# Patient Record
Sex: Female | Born: 1940
Health system: Southern US, Community
[De-identification: ages and names within clinical notes are randomized; demographics above are authoritative.]

## PROBLEM LIST (undated history)

## (undated) DIAGNOSIS — I10 Essential (primary) hypertension: Secondary | ICD-10-CM

## (undated) DIAGNOSIS — M810 Age-related osteoporosis without current pathological fracture: Secondary | ICD-10-CM

## (undated) DIAGNOSIS — M1990 Unspecified osteoarthritis, unspecified site: Secondary | ICD-10-CM

## (undated) DIAGNOSIS — G629 Polyneuropathy, unspecified: Secondary | ICD-10-CM

## (undated) DIAGNOSIS — C50911 Malignant neoplasm of unspecified site of right female breast: Secondary | ICD-10-CM

## (undated) DIAGNOSIS — E7800 Pure hypercholesterolemia, unspecified: Secondary | ICD-10-CM

## (undated) DIAGNOSIS — R296 Repeated falls: Secondary | ICD-10-CM

## (undated) DIAGNOSIS — F32A Depression, unspecified: Secondary | ICD-10-CM

## (undated) DIAGNOSIS — Z9889 Other specified postprocedural states: Secondary | ICD-10-CM

## (undated) DIAGNOSIS — F329 Major depressive disorder, single episode, unspecified: Secondary | ICD-10-CM

## (undated) DIAGNOSIS — W19XXXA Unspecified fall, initial encounter: Secondary | ICD-10-CM

## (undated) DIAGNOSIS — C189 Malignant neoplasm of colon, unspecified: Secondary | ICD-10-CM

## (undated) DIAGNOSIS — Z90710 Acquired absence of both cervix and uterus: Secondary | ICD-10-CM

## (undated) DIAGNOSIS — I341 Nonrheumatic mitral (valve) prolapse: Secondary | ICD-10-CM

## (undated) DIAGNOSIS — Z9849 Cataract extraction status, unspecified eye: Secondary | ICD-10-CM

## (undated) DIAGNOSIS — C50919 Malignant neoplasm of unspecified site of unspecified female breast: Secondary | ICD-10-CM

## (undated) DIAGNOSIS — C187 Malignant neoplasm of sigmoid colon: Secondary | ICD-10-CM

## (undated) HISTORY — DX: Malignant neoplasm of colon, unspecified: C18.9

## (undated) HISTORY — DX: Nonrheumatic mitral (valve) prolapse: I34.1

## (undated) HISTORY — DX: Repeated falls: R29.6

## (undated) HISTORY — DX: Major depressive disorder, single episode, unspecified: F32.9

## (undated) HISTORY — DX: Polyneuropathy, unspecified: G62.9

## (undated) HISTORY — DX: Pure hypercholesterolemia, unspecified: E78.00

## (undated) HISTORY — DX: Unspecified osteoarthritis, unspecified site: M19.90

## (undated) HISTORY — PX: ABDOMINAL HYSTERECTOMY: SHX81

## (undated) HISTORY — PX: KNEE SURGERY: SHX244

## (undated) HISTORY — DX: Age-related osteoporosis without current pathological fracture: M81.0

## (undated) HISTORY — PX: CATARACT EXTRACTION, BILATERAL: SHX1313

## (undated) HISTORY — PX: TONSILLECTOMY: SUR1361

## (undated) HISTORY — PX: OTHER SURGICAL HISTORY: SHX169

## (undated) HISTORY — DX: Unspecified fall, initial encounter: W19.XXXA

## (undated) HISTORY — DX: Malignant neoplasm of sigmoid colon: C18.7

## (undated) HISTORY — DX: Malignant neoplasm of unspecified site of right female breast: C50.911

## (undated) HISTORY — DX: Malignant neoplasm of unspecified site of unspecified female breast: C50.919

## (undated) HISTORY — DX: Depression, unspecified: F32.A

## (undated) HISTORY — PX: LOW ANTERIOR BOWEL RESECTION: SUR1240

---

## 2000-08-14 ENCOUNTER — Ambulatory Visit (HOSPITAL_COMMUNITY): Admission: RE | Admit: 2000-08-14 | Discharge: 2000-08-14 | Payer: Self-pay | Admitting: *Deleted

## 2000-08-14 ENCOUNTER — Encounter: Payer: Self-pay | Admitting: *Deleted

## 2000-08-29 ENCOUNTER — Encounter: Payer: Self-pay | Admitting: *Deleted

## 2000-08-29 ENCOUNTER — Other Ambulatory Visit: Admission: RE | Admit: 2000-08-29 | Discharge: 2000-08-29 | Payer: Self-pay | Admitting: *Deleted

## 2000-08-29 ENCOUNTER — Ambulatory Visit (HOSPITAL_COMMUNITY): Admission: RE | Admit: 2000-08-29 | Discharge: 2000-08-29 | Payer: Self-pay | Admitting: *Deleted

## 2001-09-17 ENCOUNTER — Ambulatory Visit (HOSPITAL_COMMUNITY): Admission: RE | Admit: 2001-09-17 | Discharge: 2001-09-17 | Payer: Self-pay | Admitting: Family Medicine

## 2001-09-17 ENCOUNTER — Encounter: Payer: Self-pay | Admitting: Family Medicine

## 2002-09-29 ENCOUNTER — Ambulatory Visit (HOSPITAL_COMMUNITY): Admission: RE | Admit: 2002-09-29 | Discharge: 2002-09-29 | Payer: Self-pay | Admitting: Family Medicine

## 2002-09-29 ENCOUNTER — Encounter: Payer: Self-pay | Admitting: Family Medicine

## 2002-10-28 ENCOUNTER — Encounter: Payer: Self-pay | Admitting: Obstetrics & Gynecology

## 2002-10-28 ENCOUNTER — Ambulatory Visit (HOSPITAL_COMMUNITY): Admission: RE | Admit: 2002-10-28 | Discharge: 2002-10-28 | Payer: Self-pay | Admitting: Obstetrics & Gynecology

## 2003-12-09 ENCOUNTER — Ambulatory Visit (HOSPITAL_COMMUNITY): Admission: RE | Admit: 2003-12-09 | Discharge: 2003-12-09 | Payer: Self-pay | Admitting: Family Medicine

## 2003-12-13 ENCOUNTER — Ambulatory Visit (HOSPITAL_COMMUNITY): Admission: RE | Admit: 2003-12-13 | Discharge: 2003-12-13 | Payer: Self-pay | Admitting: Family Medicine

## 2004-05-15 ENCOUNTER — Ambulatory Visit: Payer: Self-pay | Admitting: Internal Medicine

## 2004-05-18 ENCOUNTER — Ambulatory Visit (HOSPITAL_COMMUNITY): Admission: RE | Admit: 2004-05-18 | Discharge: 2004-05-18 | Payer: Self-pay | Admitting: Internal Medicine

## 2004-05-19 ENCOUNTER — Ambulatory Visit (HOSPITAL_COMMUNITY): Admission: RE | Admit: 2004-05-19 | Discharge: 2004-05-19 | Payer: Self-pay | Admitting: Internal Medicine

## 2004-05-29 ENCOUNTER — Inpatient Hospital Stay (HOSPITAL_COMMUNITY): Admission: RE | Admit: 2004-05-29 | Discharge: 2004-06-03 | Payer: Self-pay | Admitting: General Surgery

## 2004-06-30 ENCOUNTER — Encounter (HOSPITAL_COMMUNITY): Admission: RE | Admit: 2004-06-30 | Discharge: 2004-07-30 | Payer: Self-pay | Admitting: Oncology

## 2004-06-30 ENCOUNTER — Encounter: Admission: RE | Admit: 2004-06-30 | Discharge: 2004-06-30 | Payer: Self-pay | Admitting: Oncology

## 2004-07-12 ENCOUNTER — Ambulatory Visit (HOSPITAL_COMMUNITY): Payer: Self-pay | Admitting: Oncology

## 2004-07-19 ENCOUNTER — Ambulatory Visit (HOSPITAL_COMMUNITY): Admission: RE | Admit: 2004-07-19 | Discharge: 2004-07-19 | Payer: Self-pay | Admitting: Oncology

## 2004-07-24 ENCOUNTER — Ambulatory Visit (HOSPITAL_COMMUNITY): Admission: RE | Admit: 2004-07-24 | Discharge: 2004-07-24 | Payer: Self-pay | Admitting: General Surgery

## 2004-08-08 ENCOUNTER — Encounter (HOSPITAL_COMMUNITY): Admission: RE | Admit: 2004-08-08 | Discharge: 2004-09-07 | Payer: Self-pay | Admitting: Oncology

## 2004-08-08 ENCOUNTER — Encounter: Admission: RE | Admit: 2004-08-08 | Discharge: 2004-08-08 | Payer: Self-pay | Admitting: Oncology

## 2004-08-22 ENCOUNTER — Encounter: Admission: RE | Admit: 2004-08-22 | Discharge: 2004-08-22 | Payer: Self-pay | Admitting: Oncology

## 2004-08-29 ENCOUNTER — Ambulatory Visit (HOSPITAL_COMMUNITY): Payer: Self-pay | Admitting: Oncology

## 2004-09-05 ENCOUNTER — Encounter (HOSPITAL_COMMUNITY): Admission: RE | Admit: 2004-09-05 | Discharge: 2004-09-05 | Payer: Self-pay | Admitting: Family Medicine

## 2004-09-26 ENCOUNTER — Encounter: Admission: RE | Admit: 2004-09-26 | Discharge: 2004-09-26 | Payer: Self-pay | Admitting: Oncology

## 2004-09-26 ENCOUNTER — Encounter (HOSPITAL_COMMUNITY): Admission: RE | Admit: 2004-09-26 | Discharge: 2004-10-26 | Payer: Self-pay | Admitting: Oncology

## 2004-10-17 ENCOUNTER — Ambulatory Visit (HOSPITAL_COMMUNITY): Payer: Self-pay | Admitting: Oncology

## 2004-10-31 ENCOUNTER — Encounter: Admission: RE | Admit: 2004-10-31 | Discharge: 2004-10-31 | Payer: Self-pay | Admitting: Oncology

## 2004-10-31 ENCOUNTER — Encounter (HOSPITAL_COMMUNITY): Admission: RE | Admit: 2004-10-31 | Discharge: 2004-11-30 | Payer: Self-pay | Admitting: Oncology

## 2004-12-06 ENCOUNTER — Encounter (HOSPITAL_COMMUNITY): Admission: RE | Admit: 2004-12-06 | Discharge: 2004-12-30 | Payer: Self-pay | Admitting: Oncology

## 2004-12-06 ENCOUNTER — Encounter: Admission: RE | Admit: 2004-12-06 | Discharge: 2004-12-30 | Payer: Self-pay | Admitting: Oncology

## 2004-12-11 ENCOUNTER — Ambulatory Visit (HOSPITAL_COMMUNITY): Payer: Self-pay | Admitting: Oncology

## 2004-12-20 ENCOUNTER — Encounter (INDEPENDENT_AMBULATORY_CARE_PROVIDER_SITE_OTHER): Payer: Self-pay | Admitting: General Surgery

## 2004-12-20 ENCOUNTER — Ambulatory Visit (HOSPITAL_COMMUNITY): Admission: RE | Admit: 2004-12-20 | Discharge: 2004-12-20 | Payer: Self-pay | Admitting: General Surgery

## 2005-01-08 ENCOUNTER — Encounter: Admission: RE | Admit: 2005-01-08 | Discharge: 2005-01-08 | Payer: Self-pay | Admitting: Oncology

## 2005-01-08 ENCOUNTER — Encounter (HOSPITAL_COMMUNITY): Admission: RE | Admit: 2005-01-08 | Discharge: 2005-02-07 | Payer: Self-pay | Admitting: Oncology

## 2005-01-25 ENCOUNTER — Ambulatory Visit: Admission: RE | Admit: 2005-01-25 | Discharge: 2005-03-05 | Payer: Self-pay | Admitting: *Deleted

## 2005-02-05 ENCOUNTER — Ambulatory Visit (HOSPITAL_COMMUNITY): Payer: Self-pay | Admitting: Oncology

## 2005-02-12 ENCOUNTER — Encounter: Admission: RE | Admit: 2005-02-12 | Discharge: 2005-02-12 | Payer: Self-pay | Admitting: Oncology

## 2005-02-12 ENCOUNTER — Encounter (HOSPITAL_COMMUNITY): Admission: RE | Admit: 2005-02-12 | Discharge: 2005-03-14 | Payer: Self-pay | Admitting: Oncology

## 2005-03-19 ENCOUNTER — Encounter: Admission: RE | Admit: 2005-03-19 | Discharge: 2005-03-19 | Payer: Self-pay | Admitting: Oncology

## 2005-03-19 ENCOUNTER — Encounter (HOSPITAL_COMMUNITY): Admission: RE | Admit: 2005-03-19 | Discharge: 2005-03-28 | Payer: Self-pay | Admitting: Oncology

## 2005-03-28 ENCOUNTER — Ambulatory Visit (HOSPITAL_COMMUNITY): Payer: Self-pay | Admitting: Oncology

## 2005-04-03 ENCOUNTER — Encounter (HOSPITAL_COMMUNITY): Admission: RE | Admit: 2005-04-03 | Discharge: 2005-05-03 | Payer: Self-pay | Admitting: Oncology

## 2005-04-03 ENCOUNTER — Encounter: Admission: RE | Admit: 2005-04-03 | Discharge: 2005-04-03 | Payer: Self-pay | Admitting: Oncology

## 2005-05-15 ENCOUNTER — Encounter (HOSPITAL_COMMUNITY): Admission: RE | Admit: 2005-05-15 | Discharge: 2005-06-14 | Payer: Self-pay | Admitting: Oncology

## 2005-05-15 ENCOUNTER — Ambulatory Visit (HOSPITAL_COMMUNITY): Payer: Self-pay | Admitting: Oncology

## 2005-05-15 ENCOUNTER — Encounter: Admission: RE | Admit: 2005-05-15 | Discharge: 2005-05-15 | Payer: Self-pay | Admitting: Oncology

## 2005-06-19 ENCOUNTER — Encounter (HOSPITAL_COMMUNITY): Admission: RE | Admit: 2005-06-19 | Discharge: 2005-07-19 | Payer: Self-pay | Admitting: Oncology

## 2005-06-19 ENCOUNTER — Encounter: Admission: RE | Admit: 2005-06-19 | Discharge: 2005-06-19 | Payer: Self-pay | Admitting: Oncology

## 2005-07-18 ENCOUNTER — Ambulatory Visit: Payer: Self-pay | Admitting: Internal Medicine

## 2005-07-18 ENCOUNTER — Ambulatory Visit (HOSPITAL_COMMUNITY): Admission: RE | Admit: 2005-07-18 | Discharge: 2005-07-18 | Payer: Self-pay | Admitting: Internal Medicine

## 2005-07-31 ENCOUNTER — Ambulatory Visit (HOSPITAL_COMMUNITY): Payer: Self-pay | Admitting: Oncology

## 2005-07-31 ENCOUNTER — Encounter: Admission: RE | Admit: 2005-07-31 | Discharge: 2005-07-31 | Payer: Self-pay | Admitting: Oncology

## 2005-07-31 ENCOUNTER — Encounter (HOSPITAL_COMMUNITY): Admission: RE | Admit: 2005-07-31 | Discharge: 2005-08-30 | Payer: Self-pay | Admitting: Oncology

## 2005-08-08 ENCOUNTER — Ambulatory Visit (HOSPITAL_COMMUNITY): Admission: RE | Admit: 2005-08-08 | Discharge: 2005-08-08 | Payer: Self-pay | Admitting: Family Medicine

## 2005-09-12 ENCOUNTER — Encounter: Admission: RE | Admit: 2005-09-12 | Discharge: 2005-09-12 | Payer: Self-pay | Admitting: Oncology

## 2005-10-24 ENCOUNTER — Ambulatory Visit (HOSPITAL_COMMUNITY): Payer: Self-pay | Admitting: Oncology

## 2005-10-24 ENCOUNTER — Encounter: Admission: RE | Admit: 2005-10-24 | Discharge: 2005-10-24 | Payer: Self-pay | Admitting: Oncology

## 2005-10-24 ENCOUNTER — Encounter (HOSPITAL_COMMUNITY): Admission: RE | Admit: 2005-10-24 | Discharge: 2005-11-23 | Payer: Self-pay | Admitting: Oncology

## 2005-12-05 ENCOUNTER — Encounter: Admission: RE | Admit: 2005-12-05 | Discharge: 2005-12-28 | Payer: Self-pay | Admitting: Oncology

## 2005-12-21 ENCOUNTER — Ambulatory Visit (HOSPITAL_COMMUNITY): Payer: Self-pay | Admitting: Oncology

## 2006-01-16 ENCOUNTER — Encounter (HOSPITAL_COMMUNITY): Admission: RE | Admit: 2006-01-16 | Discharge: 2006-02-15 | Payer: Self-pay | Admitting: Oncology

## 2006-01-16 ENCOUNTER — Encounter: Admission: RE | Admit: 2006-01-16 | Discharge: 2006-01-16 | Payer: Self-pay | Admitting: Oncology

## 2006-02-27 ENCOUNTER — Ambulatory Visit (HOSPITAL_COMMUNITY): Payer: Self-pay | Admitting: Oncology

## 2006-04-10 ENCOUNTER — Encounter (HOSPITAL_COMMUNITY): Admission: RE | Admit: 2006-04-10 | Discharge: 2006-05-10 | Payer: Self-pay | Admitting: Oncology

## 2006-05-22 ENCOUNTER — Ambulatory Visit (HOSPITAL_COMMUNITY): Payer: Self-pay | Admitting: Oncology

## 2006-06-25 ENCOUNTER — Encounter (HOSPITAL_COMMUNITY): Admission: RE | Admit: 2006-06-25 | Discharge: 2006-07-25 | Payer: Self-pay | Admitting: Oncology

## 2006-08-13 ENCOUNTER — Ambulatory Visit (HOSPITAL_COMMUNITY): Payer: Self-pay | Admitting: Oncology

## 2006-09-04 ENCOUNTER — Ambulatory Visit (HOSPITAL_COMMUNITY): Admission: RE | Admit: 2006-09-04 | Discharge: 2006-09-04 | Payer: Self-pay | Admitting: Family Medicine

## 2006-09-25 ENCOUNTER — Encounter (HOSPITAL_COMMUNITY): Admission: RE | Admit: 2006-09-25 | Discharge: 2006-10-25 | Payer: Self-pay | Admitting: Oncology

## 2006-11-06 ENCOUNTER — Ambulatory Visit (HOSPITAL_COMMUNITY): Payer: Self-pay | Admitting: Oncology

## 2006-12-30 ENCOUNTER — Encounter (HOSPITAL_COMMUNITY): Admission: RE | Admit: 2006-12-30 | Discharge: 2006-12-31 | Payer: Self-pay | Admitting: Oncology

## 2006-12-30 ENCOUNTER — Ambulatory Visit (HOSPITAL_COMMUNITY): Payer: Self-pay | Admitting: Oncology

## 2007-03-25 ENCOUNTER — Ambulatory Visit (HOSPITAL_COMMUNITY): Payer: Self-pay | Admitting: Oncology

## 2007-03-25 ENCOUNTER — Encounter (HOSPITAL_COMMUNITY): Admission: RE | Admit: 2007-03-25 | Discharge: 2007-04-02 | Payer: Self-pay | Admitting: Oncology

## 2007-06-17 ENCOUNTER — Ambulatory Visit (HOSPITAL_COMMUNITY): Payer: Self-pay | Admitting: Oncology

## 2007-06-17 ENCOUNTER — Encounter (HOSPITAL_COMMUNITY): Admission: RE | Admit: 2007-06-17 | Discharge: 2007-07-17 | Payer: Self-pay | Admitting: Oncology

## 2007-09-08 ENCOUNTER — Encounter (HOSPITAL_COMMUNITY): Admission: RE | Admit: 2007-09-08 | Discharge: 2007-10-08 | Payer: Self-pay | Admitting: Oncology

## 2007-09-08 ENCOUNTER — Ambulatory Visit (HOSPITAL_COMMUNITY): Payer: Self-pay | Admitting: Oncology

## 2007-10-08 ENCOUNTER — Ambulatory Visit (HOSPITAL_COMMUNITY): Admission: RE | Admit: 2007-10-08 | Discharge: 2007-10-08 | Payer: Self-pay | Admitting: Family Medicine

## 2007-12-01 ENCOUNTER — Encounter (HOSPITAL_COMMUNITY): Admission: RE | Admit: 2007-12-01 | Discharge: 2007-12-31 | Payer: Self-pay | Admitting: Oncology

## 2007-12-01 ENCOUNTER — Ambulatory Visit (HOSPITAL_COMMUNITY): Payer: Self-pay | Admitting: Oncology

## 2008-01-26 ENCOUNTER — Ambulatory Visit (HOSPITAL_COMMUNITY): Payer: Self-pay | Admitting: Oncology

## 2008-02-23 ENCOUNTER — Encounter (HOSPITAL_COMMUNITY): Admission: RE | Admit: 2008-02-23 | Discharge: 2008-03-24 | Payer: Self-pay | Admitting: Oncology

## 2008-04-05 ENCOUNTER — Ambulatory Visit (HOSPITAL_COMMUNITY): Payer: Self-pay | Admitting: Oncology

## 2008-05-17 ENCOUNTER — Encounter (HOSPITAL_COMMUNITY): Admission: RE | Admit: 2008-05-17 | Discharge: 2008-06-16 | Payer: Self-pay | Admitting: Oncology

## 2008-07-07 ENCOUNTER — Ambulatory Visit (HOSPITAL_COMMUNITY): Payer: Self-pay | Admitting: Oncology

## 2008-07-26 ENCOUNTER — Encounter: Payer: Self-pay | Admitting: Internal Medicine

## 2008-08-06 ENCOUNTER — Encounter: Payer: Self-pay | Admitting: Internal Medicine

## 2008-08-18 ENCOUNTER — Encounter (HOSPITAL_COMMUNITY): Admission: RE | Admit: 2008-08-18 | Discharge: 2008-09-17 | Payer: Self-pay | Admitting: Oncology

## 2008-08-27 ENCOUNTER — Ambulatory Visit: Payer: Self-pay | Admitting: Internal Medicine

## 2008-08-27 ENCOUNTER — Ambulatory Visit (HOSPITAL_COMMUNITY): Admission: RE | Admit: 2008-08-27 | Discharge: 2008-08-27 | Payer: Self-pay | Admitting: Internal Medicine

## 2008-08-27 HISTORY — PX: COLONOSCOPY: SHX174

## 2008-08-31 ENCOUNTER — Encounter: Payer: Self-pay | Admitting: Internal Medicine

## 2008-09-23 ENCOUNTER — Ambulatory Visit (HOSPITAL_COMMUNITY): Admission: RE | Admit: 2008-09-23 | Discharge: 2008-09-23 | Payer: Self-pay | Admitting: Family Medicine

## 2008-09-28 ENCOUNTER — Ambulatory Visit (HOSPITAL_COMMUNITY): Admission: RE | Admit: 2008-09-28 | Discharge: 2008-09-28 | Payer: Self-pay | Admitting: Ophthalmology

## 2008-09-29 ENCOUNTER — Ambulatory Visit (HOSPITAL_COMMUNITY): Payer: Self-pay | Admitting: Oncology

## 2008-10-12 ENCOUNTER — Ambulatory Visit (HOSPITAL_COMMUNITY): Admission: RE | Admit: 2008-10-12 | Discharge: 2008-10-12 | Payer: Self-pay | Admitting: Ophthalmology

## 2008-11-10 ENCOUNTER — Encounter (HOSPITAL_COMMUNITY): Admission: RE | Admit: 2008-11-10 | Discharge: 2008-12-10 | Payer: Self-pay | Admitting: Oncology

## 2008-12-22 ENCOUNTER — Ambulatory Visit (HOSPITAL_COMMUNITY): Payer: Self-pay | Admitting: Oncology

## 2008-12-22 ENCOUNTER — Encounter (HOSPITAL_COMMUNITY): Admission: RE | Admit: 2008-12-22 | Discharge: 2008-12-29 | Payer: Self-pay | Admitting: Oncology

## 2009-02-07 ENCOUNTER — Encounter: Payer: Self-pay | Admitting: Internal Medicine

## 2009-02-21 ENCOUNTER — Ambulatory Visit (HOSPITAL_COMMUNITY): Payer: Self-pay | Admitting: Oncology

## 2009-03-28 ENCOUNTER — Encounter (HOSPITAL_COMMUNITY): Admission: RE | Admit: 2009-03-28 | Discharge: 2009-04-27 | Payer: Self-pay | Admitting: Oncology

## 2009-05-09 ENCOUNTER — Ambulatory Visit (HOSPITAL_COMMUNITY): Payer: Self-pay | Admitting: Oncology

## 2009-06-20 ENCOUNTER — Encounter (HOSPITAL_COMMUNITY): Admission: RE | Admit: 2009-06-20 | Discharge: 2009-07-20 | Payer: Self-pay | Admitting: Oncology

## 2009-07-26 ENCOUNTER — Ambulatory Visit (HOSPITAL_COMMUNITY): Payer: Self-pay | Admitting: Oncology

## 2009-09-02 ENCOUNTER — Encounter: Payer: Self-pay | Admitting: Internal Medicine

## 2009-09-06 ENCOUNTER — Encounter (HOSPITAL_COMMUNITY): Admission: RE | Admit: 2009-09-06 | Discharge: 2009-10-06 | Payer: Self-pay | Admitting: Oncology

## 2009-09-28 ENCOUNTER — Ambulatory Visit (HOSPITAL_COMMUNITY): Admission: RE | Admit: 2009-09-28 | Discharge: 2009-09-28 | Payer: Self-pay | Admitting: Oncology

## 2009-10-18 ENCOUNTER — Ambulatory Visit (HOSPITAL_COMMUNITY): Payer: Self-pay | Admitting: Oncology

## 2009-11-16 ENCOUNTER — Ambulatory Visit (HOSPITAL_COMMUNITY): Admission: RE | Admit: 2009-11-16 | Discharge: 2009-11-16 | Payer: Self-pay | Admitting: Family Medicine

## 2009-11-29 ENCOUNTER — Encounter (HOSPITAL_COMMUNITY): Admission: RE | Admit: 2009-11-29 | Discharge: 2009-12-29 | Payer: Self-pay | Admitting: Oncology

## 2010-01-24 ENCOUNTER — Ambulatory Visit (HOSPITAL_COMMUNITY): Payer: Self-pay | Admitting: Oncology

## 2010-02-08 ENCOUNTER — Encounter: Payer: Self-pay | Admitting: Internal Medicine

## 2010-03-06 ENCOUNTER — Encounter (HOSPITAL_COMMUNITY)
Admission: RE | Admit: 2010-03-06 | Discharge: 2010-04-05 | Payer: Self-pay | Source: Home / Self Care | Attending: Oncology | Admitting: Oncology

## 2010-04-02 DIAGNOSIS — Z923 Personal history of irradiation: Secondary | ICD-10-CM

## 2010-04-02 HISTORY — DX: Personal history of irradiation: Z92.3

## 2010-04-18 ENCOUNTER — Encounter (HOSPITAL_COMMUNITY)
Admission: RE | Admit: 2010-04-18 | Discharge: 2010-05-02 | Payer: Self-pay | Source: Home / Self Care | Attending: Oncology | Admitting: Oncology

## 2010-04-18 ENCOUNTER — Ambulatory Visit (HOSPITAL_COMMUNITY)
Admission: RE | Admit: 2010-04-18 | Discharge: 2010-05-02 | Payer: Self-pay | Source: Home / Self Care | Attending: Oncology | Admitting: Oncology

## 2010-04-24 ENCOUNTER — Encounter: Payer: Self-pay | Admitting: Family Medicine

## 2010-05-02 NOTE — Letter (Signed)
Summary: [facility]  [facility]   Imported By: Minna Merritts 09/02/2009 16:41:56  _____________________________________________________________________  External Attachment:    Type:   Image     Comment:   External Document

## 2010-05-02 NOTE — Letter (Signed)
Summary: [facility]  [facility]   Imported By: Rexene Alberts 02/08/2010 16:50:22  _____________________________________________________________________  External Attachment:    Type:   Image     Comment:   External Document

## 2010-05-30 ENCOUNTER — Other Ambulatory Visit (HOSPITAL_COMMUNITY): Payer: Medicare Other

## 2010-05-30 ENCOUNTER — Encounter (HOSPITAL_COMMUNITY): Payer: Medicare Other | Attending: Oncology

## 2010-05-30 DIAGNOSIS — C50919 Malignant neoplasm of unspecified site of unspecified female breast: Secondary | ICD-10-CM

## 2010-05-30 DIAGNOSIS — Z853 Personal history of malignant neoplasm of breast: Secondary | ICD-10-CM | POA: Insufficient documentation

## 2010-05-30 DIAGNOSIS — Z85038 Personal history of other malignant neoplasm of large intestine: Secondary | ICD-10-CM | POA: Insufficient documentation

## 2010-05-30 DIAGNOSIS — C189 Malignant neoplasm of colon, unspecified: Secondary | ICD-10-CM

## 2010-06-12 LAB — CBC
MCH: 30.8 pg (ref 26.0–34.0)
MCHC: 34.5 g/dL (ref 30.0–36.0)
Platelets: 119 10*3/uL — ABNORMAL LOW (ref 150–400)

## 2010-06-12 LAB — COMPREHENSIVE METABOLIC PANEL
Albumin: 4 g/dL (ref 3.5–5.2)
BUN: 10 mg/dL (ref 6–23)
Chloride: 104 mEq/L (ref 96–112)
Creatinine, Ser: 0.95 mg/dL (ref 0.4–1.2)
Total Bilirubin: 0.7 mg/dL (ref 0.3–1.2)
Total Protein: 6.2 g/dL (ref 6.0–8.3)

## 2010-06-12 LAB — CEA: CEA: <0.5 ng/mL (ref 0.0–5.0)

## 2010-06-12 LAB — CANCER ANTIGEN 27.29: CA 27.29: 10 U/mL (ref 0–39)

## 2010-06-16 LAB — CEA: CEA: <0.5 ng/mL (ref 0.0–5.0)

## 2010-06-19 LAB — CANCER ANTIGEN 27.29: CA 27.29: 9 U/mL (ref 0–39)

## 2010-06-25 LAB — CEA: CEA: 0.7 ng/mL (ref 0.0–5.0)

## 2010-07-07 LAB — CEA: CEA: <0.5 ng/mL (ref 0.0–5.0)

## 2010-07-08 LAB — CEA: CEA: <0.5 ng/mL (ref 0.0–5.0)

## 2010-07-10 LAB — HEMOGLOBIN AND HEMATOCRIT, BLOOD
HCT: 37.2 % (ref 36.0–46.0)
Hemoglobin: 13.1 g/dL (ref 12.0–15.0)

## 2010-07-10 LAB — BASIC METABOLIC PANEL
BUN: 14 mg/dL (ref 6–23)
CO2: 27 mEq/L (ref 19–32)
Calcium: 9.1 mg/dL (ref 8.4–10.5)
Chloride: 104 mEq/L (ref 96–112)
Creatinine, Ser: 1.01 mg/dL (ref 0.4–1.2)
GFR calc Af Amer: >60 mL/min (ref >60)
GFR calc non Af Amer: 55 mL/min — ABNORMAL LOW (ref >60)
Glucose, Bld: 92 mg/dL (ref 70–99)
Potassium: 4.4 mEq/L (ref 3.5–5.1)
Sodium: 137 mEq/L (ref 135–145)

## 2010-07-11 ENCOUNTER — Encounter (HOSPITAL_COMMUNITY): Payer: Medicare Other | Admitting: Oncology

## 2010-07-11 ENCOUNTER — Encounter (HOSPITAL_COMMUNITY): Payer: Medicare Other | Attending: Oncology

## 2010-07-11 DIAGNOSIS — Z85038 Personal history of other malignant neoplasm of large intestine: Secondary | ICD-10-CM | POA: Insufficient documentation

## 2010-07-11 DIAGNOSIS — C50919 Malignant neoplasm of unspecified site of unspecified female breast: Secondary | ICD-10-CM

## 2010-07-11 DIAGNOSIS — C189 Malignant neoplasm of colon, unspecified: Secondary | ICD-10-CM

## 2010-07-11 DIAGNOSIS — Z853 Personal history of malignant neoplasm of breast: Secondary | ICD-10-CM | POA: Insufficient documentation

## 2010-07-11 LAB — CANCER ANTIGEN 27.29: CA 27.29: 14 U/mL (ref 0–39)

## 2010-07-18 LAB — CEA: CEA: <0.5 ng/mL (ref 0.0–5.0)

## 2010-08-15 NOTE — Op Note (Signed)
NAME:  Patricia Kline, Patricia Kline                  ACCOUNT NO.:  [id number]   MEDICAL RECORD NO.:  [id number]          PATIENT TYPE:  AMB   LOCATION:  DAY                           FACILITY:  [facility]   PHYSICIAN:  R. Roetta Sessions, M.D. DATE OF BIRTH:  03-14-1941   DATE OF PROCEDURE:  08/27/2008  DATE OF DISCHARGE:                               OPERATIVE REPORT   SURVEILLANCE COLONOSCOPY   INDICATIONS FOR PROCEDURE:  A 67-year lady who underwent a low anterior  resection for colorectal cancer back in 2006.  She had negative  surveillance exam in 2007.  She is devoid of any lower GI tract symptoms  now.  She is here for her 3-year surveillance examination.  Risks,  benefits, alternatives, and limitations have been discussed, questions  answered.  Please see documentation in the medical record.   PROCEDURE NOTE:  O2 saturation, blood pressure, pulse and respirations  were monitored throughout the entire procedure.  Conscious sedation  Versed 5 mg IV, Demerol 100 mg IV in divided doses.  Zofran 4 mg IV  prophylaxis against nausea.   INSTRUMENT:  Pentax video chip system.   FINDINGS:  Digital rectal exam revealed no abnormalities.   ENDOSCOPIC FINDINGS:  The prep was good.   Examination of rectal mucosa revealed some anal papilla.  Surgical  anastomosis at 8-10 cm that appeared normal.  The residual colon was  imaged from the anastomosis all the way to the cecum.  Cecum, ileocecal  valve, appendiceal orifice well seen and photographed for the record.  From this level, scope was slowly and cautiously withdrawn.  All  previous mentioned mucosal surfaces were again seen.  The residual  colonic mucosa appeared normal.  The scope was pulled down in the rectum  where the above-mentioned findings are reconfirmed.  The rectal vault  was small and did not attempt to retroflex, but for the same reason, I  was able see the rectal mucosa en face very well, and it appeared normal  aside from a couple anal  papilla.  The patient tolerated the procedure  well and was reactive to endoscopy.  Cecal withdrawal time 6 minutes.   IMPRESSION:  Anal papilla.  Surgical anastomosis at 8-10 cm from the  anal verge.  Residual rectal and colonic mucosa appeared normal.   RECOMMENDATIONS:  Repeat colonoscopy in 5 years.      Jonathon Bellows, M.D.  Electronically Signed     RMR/MEDQ  D:  08/27/2008  T:  08/27/2008  Job:  [id number]   cc:   Ladona Horns. Mariel Sleet, MD  Fax: [phone]

## 2010-08-18 NOTE — Op Note (Signed)
NAME:  Patricia Kline, Patricia Kline                  ACCOUNT NO.:  [id number]   MEDICAL RECORD NO.:  [id number]          PATIENT TYPE:  AMB   LOCATION:  DAY                           FACILITY:  [facility]   PHYSICIAN:  R. Roetta Sessions, M.D. DATE OF BIRTH:  Apr 17, 1940   DATE OF PROCEDURE:  05/18/2004  DATE OF DISCHARGE:                                 OPERATIVE REPORT   PROCEDURE:  Incomplete colonoscopy (sigmoidoscopy with biopsy).   INDICATIONS FOR PROCEDURE:  The patient is a 70 year old lady with chronic  diarrhea and intermittent blood per rectum. She was seen in the office back  in October of 2005 with chronic diarrhea. At that time, she declined  colonoscopy. She is now agreeable and comes for colonoscopy at this time.  This approach has been discussed with the patient at length. Potential  risks, benefits, and alternatives have been reviewed and questions answered.   PROCEDURE NOTE:  O2 saturation, blood pressure, pulse, and respirations  monitored throughout the entirety of the procedure. Conscious sedation with  Versed 4 mg IV and Demerol 75 mg IV and Zofran 4 mg for nausea prior to the  procedure.   INSTRUMENT:  Olympus video chip system (pediatric scope).   FINDINGS:  Digital rectal examination reveals a fungating mass barely  palpable at the tip of the examiner's finger.   ENDOSCOPIC FINDINGS:  Prep was adequate.   Rectum:  Examination of the rectal mucosa revealed a cauliflower appearing  apple-core exophytic tumor beginning at 10 cm from the anal verge. The  rectum lumen narrowed down to a pinpoint in the center. After several  attempts, I was unable to negotiate the pediatric scope through the severely  narrowed lumen. I was able to retroflex and examine the remainder of the  rectum distal to this lesion which revealed no abnormalities. Multiple  biopsies were taken. The lesion was friable. Biopsy tissue came off in  chunks. The patient the procedure very well.   IMPRESSION:   Fungating exophytic apple-core rectal tumor, severely narrowing  the lumen. Proximal extent unknown. Could not advance the pediatric scope  beyond the distal aspect of the strictured segment. This lesion began 10 cm  from the anal verge. It is palpable on digital rectal examination. Biopsies  taken.   RECOMMENDATIONS:  1.  Will draw labs including a CEA.  2.  Will go ahead and do an abdominal and pelvic CT scan. She will need      surgical consultation in the very near future. This is a low-lying      lesion.      RMR/MEDQ  D:  05/18/2004  T:  05/18/2004  Job:  [id number]   cc:   Corrie Mckusick, M.D.  Fax: [phone]

## 2010-08-18 NOTE — Op Note (Signed)
NAME:  Patricia Kline, Patricia Kline NO.:  [id number]   MEDICAL RECORD NO.:  [id number]          PATIENT TYPE:  AMB   LOCATION:  DAY                           FACILITY:  [facility]   PHYSICIAN:  Dalia Heading, M.D.  DATE OF BIRTH:  1940/06/04   DATE OF PROCEDURE:  05/29/2004  DATE OF DISCHARGE:                                 OPERATIVE REPORT   PREOPERATIVE DIAGNOSIS:  Distal sigmoid colon carcinoma.   POSTOPERATIVE DIAGNOSIS:  Distal sigmoid colon carcinoma.   PROCEDURE:  Low anterior resection with transanal anastomosis.   SURGEON:  Dr. Franky Macho.   ASSISTANT:  Dr. Arna Snipe.   ANESTHESIA:  General endotracheal.   INDICATIONS:  The patient is a 70 year old white female who presents with an  apple-core lesion in the distal sigmoid colon. It is palpable by rectal  examination. Risks and benefits of the procedure including bleeding,  infection, cardiopulmonary difficulties, the possibility of a blood  transfusion, and the possibility of a colostomy were fully explained to the  patient, who gave informed consent.   PROCEDURE NOTE:  The patient was placed in the lithotomy position after  induction of general endotracheal anesthesia. The patient was then prepped  and draped using the usual sterile technique with Betadine. Surgical site  confirmation was performed.   A lower midline incision was made from the umbilicus to the suprapubic  region. The peritoneal cavity was entered into without difficulty. On  exploration of the abdomen, the limits. The gallbladder was within normal  limits. The nasogastric tube was noted be in its appropriate position in the  stomach. The small bowel and colon was within normal limits. No abnormal  lesions were noted. The patient was noted to have enlarged right ovary with  endometriosis present. This was freed away from the underlying colon. The  mesentery of the sigmoid colon and rectum was incised using the harmonic  scalpel on either  side. The mesentery was divided using the harmonic  scalpel. The inferior rectal artery and vein were divided without  difficulty. The mass was noted to be folded up and attached anteriorly to  the perineum. This was freed away sharply and bluntly without difficulty. A  TA stapler was placed distal to the mass and fired. GIA staple was placed  across the midsigmoid colon and fired. A single suture was placed to mark  the bowel anteriorly. Specimen was sent to pathology. Grossly, the anterior  distal margin was 1.6 cm, distal posterior margin 3 cm. Specimen was then  further processed by pathology. An auto purse-stringer was placed on the  proximal colon and fired. The anvil of the circular EEA was then inserted  into this. The base of the #29 circular EEA stapler was then placed  transanally. The two ends were connected together, and the EEA circular  stapler was fired. Two good donuts were found at the end of the stapling.  The staple line was bolstered using 3-0 silk sutures. Air was instilled  transanally. There was no evidence of anastomotic leak. The pelvis was then  copiously irrigated with gentamicin  and normal saline. Surgicel and Gelfoam  were then packed along the posterior aspect of the anastomosis. The bowel  was returned into the abdominal cavity in orderly fashion. All operating  room personnel then changed their gloves.   The fascia was reapproximated using a looped O Novofil running suture.  Subcutaneous layer was irrigated with normal saline and skin was closed  using staples. Betadine ointment and dry sterile dressing were applied.   All tape and needle counts were correct and the procedure. The patient was  extubated in the upper room went back to recovery room awake in stable  condition.   COMPLICATIONS:  None.   SPECIMEN:  Colon, opened and distal.   BLOOD LOSS:  250 cc.      MAJ/MEDQ  D:  05/29/2004  T:  05/29/2004  Job:  [id number]   cc:   R. Roetta Sessions,  M.D.  [address]  [place]  [place] [address]   Corrie Mckusick, M.D.  Fax: [phone]

## 2010-08-18 NOTE — Discharge Summary (Signed)
NAME:  Patricia Kline, Patricia Kline NO.:  [id number]   MEDICAL RECORD NO.:  [id number]          PATIENT TYPE:  INP   LOCATION:  A327                          FACILITY:  [facility]   PHYSICIAN:  Dalia Heading, M.D.  DATE OF BIRTH:  02-Apr-1941   DATE OF ADMISSION:  DATE OF DISCHARGE:  03/04/2006LH                                 DISCHARGE SUMMARY   HOSPITAL COURSE:  Summary, the patient is a 70 year old white female who was  found on colonoscopy to have a distal sigmoid colon stricture which was  positive for adenocarcinoma. The patient underwent a low anterior resection  with transanal anastomosis on 05/29/2004. She tolerated the surgery well.  Her postoperative course was remarkable for anemia. She did receive 4 units  of packed red blood cells postoperatively. Her Lovenox was held as it was  suspected this was causing excess bleeding in addition to the surgery and  her preoperative anemia. Her diet was advanced without difficulty once her  bowel function returned. 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 Her preoperative CEA level was 2.9.   The patient's hematocrit was 30 at the time of discharge. The patient is  being discharged home in good and improving condition.   DISCHARGE INSTRUCTIONS:  The patient is to follow up Dr. Franky Macho on  06/06/2004.   DISCHARGE MEDICATIONS:  Discharge medications include iron supplements 1  tablet p.o. b.i.d.  She is to hold her blood pressure medications for now.   PRINCIPAL DIAGNOSES:  1.  [facility] colon carcinoma  2.  Anemia.  3.  History of hypertension.   PRINCIPAL PROCEDURE:  Low anterior resection with transanal anastomosis on  05/29/2004.      MAJ/MEDQ  D:  06/03/2004  T:  06/03/2004  Job:  [id number]   cc:   R. Roetta Sessions, M.D.  [address]  [place]  [place] [address]   Corrie Mckusick, M.D.  Fax: [phone]

## 2010-08-18 NOTE — H&P (Signed)
NAME:  Patricia Kline, Patricia Kline                  ACCOUNT NO.:  [id number]   MEDICAL RECORD NO.:  [id number]          PATIENT TYPE:  AMB   LOCATION:                                 FACILITY:   PHYSICIAN:  Dalia Heading, M.D.  DATE OF BIRTH:  [birth year]   DATE OF ADMISSION:  DATE OF DISCHARGE:  LH                                HISTORY & PHYSICAL   CHIEF COMPLAINT:  Need for central venous access for chemotherapy.   HISTORY OF PRESENT ILLNESS:  The patient is a 70 year old white female  status post a low anterior resection in February 2006 who now presents for  Port-A-Cath. She is about to undergo chemotherapy for treatment of her colon  cancer.   PAST MEDICAL HISTORY:  As noted above, hypertension.   PAST SURGICAL HISTORY:  1.  Low anterior resection in February 2006.  2.  Breast biopsy.  3.  Bone tumor, right leg.  4.  Hysterectomy.  5.  Tonsillectomy.   CURRENT MEDICATIONS:  1.  Lisinopril/hydrochlorothiazide 20/12.5 mg p.o. q.d.  2.  Alprazolam 0.5 mg p.o. q.d.   ALLERGIES:  PENICILLIN.   REVIEW OF SYSTEMS:  Noncontributory.   PHYSICAL EXAMINATION:  GENERAL:  The patient is a white female in no acute  distress. She is afebrile and vital signs are stable.  LUNGS:  Clear to auscultation with equal breath sounds bilaterally.  HEART:  Reveals a regular rate and rhythm without S3, S4, or murmurs.  ABDOMEN:  Soft and nontender. A well healing midline surgical scar is noted.   IMPRESSION:  Colon carcinoma, need for central venous access.   PLAN:  The patient is scheduled for Port-A-Cath insertion on July 24, 2004.  The risks and benefits of the procedure including bleeding, infection, and  pneumothorax were fully explained to the patient who gave informed consent.      MAJ/MEDQ  D:  07/13/2004  T:  07/13/2004  Job:  [id number]   cc:   Ladona Horns. Neijstrom, MD  [address]  [place]  [place] [address]  Fax: [phone]   [facility]  Fax: [phone]   Corrie Mckusick,  M.D.  Fax: [phone]

## 2010-08-18 NOTE — Op Note (Signed)
NAME:  Patricia Kline, Patricia Kline NO.:  [id number]   MEDICAL RECORD NO.:  [id number]          PATIENT TYPE:  AMB   LOCATION:  DAY                           [facility]   PHYSICIAN:  Dalia Heading, M.D.  DATE OF BIRTH:  [birth year]   DATE OF PROCEDURE:  12/20/2004  DATE OF DISCHARGE:                                 OPERATIVE REPORT   PREOPERATIVE DIAGNOSIS:  Right breast carcinoma.   POSTOPERATIVE DIAGNOSIS:  Right breast carcinoma.   PROCEDURE:  Right partial mastectomy, sentinel lymph node biopsy.   SURGEON:  Dr. Franky Macho.   ANESTHESIA:  General endotracheal.   INDICATIONS:  The patient is a 70 year old white female who was found to  have a right breast carcinoma during the postoperative workup for colon  cancer. She has already undergone chemotherapy for the colon cancer and now  presents for right partial mastectomy, sentinel lymph node biopsy, and  possible axillary dissection. The risks and benefits of the procedure  including bleeding, infection, nerve injury, and the possibly of  cardiopulmonary difficulties were fully explained to the patient, who gave  informed consent.   PROCEDURE NOTE:  The patient was placed in supine position. After induction  of general endotracheal anesthesia, the right breast in the area of the  needle localization had blue dye injected subdermally. The artery was then  massaged for 5 minutes. The right breast and axilla were prepped and draped  using the usual sterile technique with Betadine. Of note was the fact the  patient underwent needle localization as the mass was not palpable, and a  clip had been left during her prior core biopsy. Surgical site confirmation  was performed.   A small longitudinal incision was made in the right axilla after a NeoProbe  was used to localize the sentinel lymph nodes. The skin count was 200. The  dissection was taken down to the first sentinel lymph node. The in vivo  count was 100, ex  vivo count was 100. The lymph node was blue. This was sent  to pathology for further examination. A second sentinel lymph node was  found. The in vivo count was 32, ex vivo count 27, and the node was blue.  Basin count was less 5. Both lymph nodes on frozen section were negative for  malignancy. The wound was irrigated normal saline. Any bleeding was  controlled using Bovie electrocautery. The subcutaneous layer was  reapproximated using a 3-0 Vicryl interrupted suture. The skin was closed  using a 4-0 Vicryl subcuticular suture. Of note was the fact that there were  some high counts that there were noted high up towards the clavicle. The  posterior aspect of the pectoralis minor muscle was palpated, but no lymph  node was palpable. It was assumed that these counts were around the  clavicular chain. Next, an incision was made in the lateral lower portion of  the right breast. The wire was followed down to its tip, and the right  partial mastectomy was performed. It was sent to radiology. Specimen  radiography revealed the clip and suspicious  area to be within the specimen  removed. The specimen was marked laterally with a long suture and superiorly  with a short suture for orientation. Specimen was sent to pathology for  further examination. Bleeding was controlled using Bovie electrocautery. The  subcutaneous layer was reapproximated using a 4-0 Vicryl subcuticular  suture. Sensorcaine 0.5% was instilled in both wounds. Both wounds were  covered with Dermabond.   All tape and needle counts were correct at the end of the procedure. The  patient was extubated in the operating room and went back to recovery room  awake in stable condition.   COMPLICATIONS:  None.   SPECIMEN:  1.  Sentinel lymph nodes, right axilla.  2.  Right breast tissue.   BLOOD LOSS:  Minimal.      Dalia Heading, M.D.  Electronically Signed     MAJ/MEDQ  D:  12/20/2004  T:  12/20/2004  Job:  [id number]

## 2010-08-18 NOTE — Op Note (Signed)
NAME:  Patricia Kline, Patricia Kline                  ACCOUNT NO.:  [id number]   MEDICAL RECORD NO.:  [id number]          PATIENT TYPE:  AMB   LOCATION:  DAY                           FACILITY:  [facility]   PHYSICIAN:  Dalia Heading, M.D.  DATE OF BIRTH:  05/24/1940   DATE OF PROCEDURE:  07/24/2004  DATE OF DISCHARGE:                                 OPERATIVE REPORT   PREOPERATIVE DIAGNOSIS:  Colon carcinoma, need for central venous access.   POSTOPERATIVE DIAGNOSIS:  Colon carcinoma, need for central venous access.   PROCEDURE:  Port-A-Cath insertion.   SURGEON:  Dr. Franky Macho.   ANESTHESIA:  MAC.   INDICATIONS:  The patient is a 70 year old white female status post a low  anterior resection recently for colon carcinoma who now presents for Port-A-  Cath insertion due to the need for chemotherapy. The risks and benefits of  the procedure including bleeding, infection, pneumothorax were fully  explained to the patient who gave informed consent.   PROCEDURE NOTE:  The patient was placed in a Trendelenburg position after  sedation was given. The left upper chest was prepped and draped using the  usual sterile technique with Betadine. Surgical site confirmation was  performed. One percent Xylocaine was used for local anesthesia.   An incision was made below the left clavicle. Subcutaneous pocket was then  performed. Needle was advanced into the left subclavian vein without  difficulty. A guidewire was then advanced into the right atrium under  fluoroscopic guidance. An introducer and peel away sheath were then placed  over the guidewire. A catheter was then inserted through the peel away  sheath, and the peel away sheath was removed. The catheter was then attached  to the port and the port placed in a subcutaneous pocket. Adequate position  was confirmed by fluoroscopy. The port was flushed with 3,000 units of  heparin. The subcutaneous layer was reapproximated using a 3-0 interrupted  suture. The  skin was closed using a 4-0 Vicryl subcuticular suture.  Dermabond was then applied.   All tape and needle counts were correct at the end of the procedure. The  patient was transferred to day surgery in stable condition. A chest x-ray  will be performed at that time.   COMPLICATIONS:  None.   SPECIMENS:  None.   BLOOD LOSS:  Minimal.      MAJ/MEDQ  D:  07/24/2004  T:  07/24/2004  Job:  [id number]   cc:   Ladona Horns. Neijstrom, MD  [address]  [place]  [place] [address]  Fax: [phone]   Corrie Mckusick, M.D.  Fax: [phone]

## 2010-08-18 NOTE — Op Note (Signed)
NAME:  Patricia Kline, Patricia Kline                  ACCOUNT NO.:  [id number]   MEDICAL RECORD NO.:  [id number]          PATIENT TYPE:  AMB   LOCATION:  DAY                           FACILITY:  [facility]   PHYSICIAN:  R. Roetta Sessions, M.D. DATE OF BIRTH:  Sep 19, 1940   DATE OF PROCEDURE:  07/18/2005  DATE OF DISCHARGE:                                 OPERATIVE REPORT   PROCEDURE:  Surveillance colonoscopy.   INDICATIONS FOR PROCEDURE:  A 70 year old lady was found to have nearly  obstructing colorectal cancer last year.  She underwent a sigmoid resection  by Dr. Lovell Sheehan one year ago.  She has seen Dr. Mariel Sleet and has received  adjuvant chemotherapy.  Unfortunately, she has also been diagnosed with  breast cancer.  She is having no bowel symptoms currently.  She is here for  a one year surveillance examination.  This approach has been discussed with  the patient at length.  Potential risks, benefits and alternatives have been  reviewed and questions answered.  She is agreeable.  Please see  documentation on the medical record.   PROCEDURE NOTE:  O2 saturation, blood pressure, pulses, and respirations  were monitored throughout the entire procedure.  Conscious sedation with  Versed 3 mg IV, Demerol 750 mg IV in divided doses.  Zofran 4 mg IV prior to  the procedure.   INSTRUMENT:  Olympus video chip system pediatric scope.   FINDINGS:  Digital rectal exam revealed no abnormalities.   ENDOSCOPIC FINDINGS:  Prep was good.  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  The anastomosis was  then identified at 10 cm.  The mucosa appeared entirely normal otherwise.  The scope was then easily advanced up into the residual colon to the cecum.  The cecum, ileocecal valve, and appendiceal orifice were well seen and  photographed for the record from this level.  The scope was slowly and  cautiously withdrawn.  The colonic mucosa was reviewed.  The residual  colonic mucosa appeared normal.  The patient tolerated the procedure well  and was reactive.   ENDOSCOPY IMPRESSION:  1.  Anal papillae, pseudorectal diverticulum, anastomosis at 10 cm,      otherwise normal rectal mucosa.  2.  Normal residual colonic mucosa.   RECOMMENDATIONS:  Repeat colonoscopy in three years.      Jonathon Bellows, M.D.  Electronically Signed     RMR/MEDQ  D:  07/18/2005  T:  07/18/2005  Job:  [id number]   cc:   Corrie Mckusick, M.D.  Fax: [phone]   Ladona Horns. Mariel Sleet, MD  Fax: [phone]   Dr. Lovell Sheehan

## 2010-08-18 NOTE — H&P (Signed)
NAME:  Patricia Kline, Patricia Kline                  ACCOUNT NO.:  [id number]   MEDICAL RECORD NO.:  [id number]          PATIENT TYPE:  AMB   LOCATION:  DAY                           FACILITY:  [facility]   PHYSICIAN:  R. Roetta Sessions, M.D. DATE OF BIRTH:  1940-08-17   DATE OF ADMISSION:  DATE OF DISCHARGE:  LH                                HISTORY & PHYSICAL   REASON:  Colonoscopy for chronic diarrhea.   HISTORY OF PRESENT ILLNESS:  Patricia Kline is a 70 year old Caucasian female,  patient of Dr. Phillips Odor, who was seen in our office back in October of 2005  for chronic diarrhea. She offered colonoscopy and declined at that time. She  is here today with refractory diarrhea with loose to semiformed stools up to  10 times or more per day. She notes this frequency for at least the last  year. She gives history of chronic low volume diarrhea for at least four  years now. She also has bilateral lower quadrant abdominal cramping and pain  which is often relieved post defecation. She is complaining of increased  flatus. She denies any nausea, vomiting, fever or chills. She did have  history of intermittent constipation years ago although not currently. She  denies any association with particular foods. She has lost 14 pounds that  she knows of. Our records indicate at least a 9-pound weight loss in the  last 4 months. She also has history of iron-deficiency anemia, has been iron  although she was unable to tolerate it. It was discontinued in January of  2006. Last hemoglobin from Dr. Lamar Blinks office was January 31, 2004.  Hemoglobin was 10.6 with a hematocrit of 35.2, up from 9.7 with hematocrit  of 32.1. She did have a low iron at 21. B12, folate, TIBC were normal. TIBC  percent saturation and ferritin were both low as well. She does report  intermittent small volume, light red rectal bleeding noted on the toilet  paper and in the stool. She also has intermittent mucus. She denies any  heartburn, indigestion,  dysphagia, or odynophagia.   PAST MEDICAL HISTORY:  1.  Shingles.  2.  Hypercholesterolemia.  3.  Hypertension.  4.  Partial hysterectomy.  5.  Tonsillectomy.  6.  Benign bone tumor removed.  7.  Bleeding associated with tonsillectomy.  8.  Hysterectomy.   CURRENT MEDICATIONS:  1.  Lisinopril/HCTZ 20/12.5 mg daily.  2.  Alprazolam 0.5 mg daily.  3.  Multivitamin daily.   ALLERGIES:  PENICILLIN.   FAMILY HISTORY:  Negative for colorectal carcinoma, liver or chronic GI  problems. Mother with history of CVA. Father had coronary artery disease and  MI.   SOCIAL HISTORY:  Patricia Kline is currently married. She lives in [place].  She denies any tobacco, alcohol, or drug use.   REVIEW OF SYSTEMS:  CONSTITUTIONAL:  She denies any fever, chills. See HPI.  CARDIOVASCULAR:  Denies any chest pain or palpitations. PULMONARY:  Denies  any shortness of breath, dyspnea, cough, or hemoptysis. GENITOURINARY:  See  HPI. ENDOCRINE:  She denies any history of diabetes mellitus or  thyroid  disease. Reports she has had a normal TSH done within the last year.   PHYSICAL EXAMINATION:  VITAL SIGNS:  Weight 110.5 pounds, blood pressure  126/80, pulse 60.  GENERAL:  Patricia Kline is an elderly, thin, Caucasian female who is alert,  oriented, pleasant, cooperative in no acute distress.  HEENT:  Sclerae clear, nonicteric. Conjunctivae pink. Oropharynx pink and  moist without any lesions.  NECK:  Supple with no masses or thyromegaly.  CHEST:  Heart regular rate and rhythm with normal S1 and S2 without murmurs,  clicks, rubs, or gallops. Lungs clear to auscultation bilaterally.  ABDOMEN:  Positive bowel sounds x4. No bruits auscultated. Soft,  nondistended, nontender without palpable mass or hepatosplenomegaly. No  rebound tenderness or guarding.  EXTREMITIES:  No edema bilaterally, 2+ pedal pulses bilaterally.  RECTAL:  Deferred.   IMPRESSION:  Patricia Kline is a 70 year old Caucasian female with chronic daily   diarrhea up to 10 times per day for at least the last year. She also notes  total four year history of loose frequent urgent stools. She also notes  intermittent hematochezia as well. She also has history of iron deficiency.  Obviously, colorectal carcinoma as well as inflammatory bowel disease should  be ruled out. We will schedule her for colonoscopy for further evaluation.  Irritable bowel syndrome is a possible but less likely diagnosis given  severity of her symptoms.   RECOMMENDATIONS:  1.  Will schedule colonoscopy with Dr. Jena Gauss in the near future. I have      discussed this procedure including risks and benefits which include but      are not limited to bleeding, infection, perforation, drug reaction. She      agrees with the plan, and consent will be obtained.  2.  Dr. Phillips Odor is following anemia, and she has scheduled repeat CBC.  3.  Further recommendations pending procedure and standard diarrhea      precautions.      KC/MEDQ  D:  05/15/2004  T:  05/15/2004  Job:  [id number]   cc:   Corrie Mckusick, M.D.  Fax: [phone]

## 2010-08-18 NOTE — H&P (Signed)
NAME:  BETSI, CRESPI NO.:  [id number]   MEDICAL RECORD NO.:  [id number]          PATIENT TYPE:  OUT   LOCATION:  RAD                           FACILITY:  [facility]   PHYSICIAN:  Dalia Heading, M.D.  DATE OF BIRTH:  Jul 17, 1940   DATE OF ADMISSION:  DATE OF DISCHARGE:  LH                                HISTORY & PHYSICAL   CHIEF COMPLAINT:  Colon carcinoma.   HISTORY OF PRESENT ILLNESS:  The patient is a 70 year old white female who  was referred for evaluation and treatment of colon carcinoma.  She was found  on colonoscopy to have a distal sigmoid colon stricture.  Dr. Jena Gauss was  unable to advance the colonoscope past this point.  She has been having  diarrhea for several weeks now.  She denies any hematochezia.  There is no  family history of colon carcinoma.  No nausea or vomiting have been noted.   PAST MEDICAL HISTORY:  Hypertension.   PAST SURGICAL HISTORY:  1.  Breast biopsy.  2.  Bone tumor on right leg.  3.  Hysterectomy.  4.  Tonsillectomy.   CURRENT MEDICATIONS:  1.  Lisinopril/hydrochlorothiazide 20/12.5 mg p.o. daily.  2.  Alprazolam 0.5 mg p.o. daily.   ALLERGIES:  PENICILLIN.   REVIEW OF SYSTEMS:  Noncontributory.   PHYSICAL EXAMINATION:  GENERAL:  On physical examination, the patient is a  well-developed, well-nourished white female in no acute distress.  VITAL SIGNS:  She is afebrile and vital signs are stable.  HEENT:  Reveals no scleral icterus.  LUNGS:  Clear to auscultation with good breath sounds bilaterally.  HEART:  Reveals a regular rate and rhythm without S3, S4, or murmurs.  ABDOMEN:  Soft, nontender, nondistended.  No hepatosplenomegaly or masses  are noted.  RECTAL:  Examination reveals a palpable mass at the tip of my index finger.   LABORATORY DATA:  CT scan of the abdomen and pelvis reveals a questionable  liver cyst versus mass.  The distal sigmoid colon mass is also noted.  Pericolonic lymphadenopathy is noted.   IMPRESSION:  Sigmoid colon carcinoma.   PLAN:  The patient is scheduled for low anterior resection on May 29, 2004.  The risks and benefits of the procedure including bleeding,  infection, and the possibility of a colostomy as well as a blood transfusion  were fully explained to the patient, who gave informed consent.  She is to  be on clear liquids for two days prior to the procedure.  A half light  preparation has been prescribed.      MAJ/MEDQ  D:  05/25/2004  T:  05/25/2004  Job:  [id number]   cc:   Dalia Heading, M.D.  [address] [place]  [place]  [place] [address]  Fax: [phone]   Corrie Mckusick, M.D.  Fax: [phone]   R. Roetta Sessions, M.D.  [address]  [place]  [place] [address]

## 2010-08-18 NOTE — H&P (Signed)
NAME:  Patricia Kline, Patricia Kline                  ACCOUNT NO.:  [id number]   MEDICAL RECORD NO.:  [id number]          PATIENT TYPE:  AMB   LOCATION:                                FACILITY:  [facility]   PHYSICIAN:  Dalia Heading, M.D.  DATE OF BIRTH:  02/27/1971   DATE OF ADMISSION:  DATE OF DISCHARGE:  LH                                HISTORY & PHYSICAL   CHIEF COMPLAINT:  Right breast carcinoma.   HISTORY OF PRESENT ILLNESS:  The patient is a 70 year old white female who  was referred for evaluation and treatment of a right breast carcinoma.  This  was found during the work-up for colon cancer.  A PET scan revealed an  abnormal lesion of the right breast.  This was not seen on mammography.  An  ultrasound-guided biopsy showed breast cancer.  She has finished with her  chemotherapy for colon cancer and is about to undergo treatment for breast  cancer.  No lump has been noted by the patient.  A small clip was left in  place by the ultrasound-guided biopsy.  No immediate family history of  breast carcinoma is noted.   PAST MEDICAL HISTORY:  As noted above.   PAST SURGICAL HISTORY:  1.  Low anterior resection in February 2006.  2.  A breast biopsy in the remote past.  3.  A bone tumor in the right leg.  4.  Hysterectomy.  5.  Tonsillectomy.  6.  Port-A-Cath insertion.   CURRENT MEDICATIONS:  1.  Lisinopril/hydrochlorothiazide 20/12.5 mg p.o. daily.  2.  Alprazolam 0.5 mg p.o. as needed.   ALLERGIES:  PENICILLIN.   REVIEW OF SYSTEMS:  Noncontributory.   PHYSICAL EXAMINATION:  GENERAL:  The patient is a well-developed, well-  nourished white female in no acute distress.  NECK:  Supple without lymphadenopathy.  LUNGS:  Clear to auscultation with equal breath sounds bilaterally.  HEART:  Regular rate and rhythm without S3, S4, or murmurs.  BREASTS: Right breast examination reveals no dominant mass, nipple discharge  or dimpling.  The axilla is negative for palpable nodes.  Left breast  examination reveals no dominant mass, nipple discharge or dimpling.  The  axilla is negative for palpable nodes.   IMPRESSION:  Right breast carcinoma.   PLAN:  The patient is scheduled for a right partial mastectomy, sentinel  lymph node biopsy, possible axilla dissection on December 20, 2004.  The  risks and benefits of the procedure including bleeding and infection as well  as right arm swelling were fully explained to the patient who gave informed  consent. She is to see a radiation oncologist on December 15, 2004.      Dalia Heading, M.D.  Electronically Signed     MAJ/MEDQ  D:  12/14/2004  T:  12/15/2004  Job:  [id number]   cc:   [facility]  Fax: [phone]   Ladona Horns. Neijstrom, MD  [address]  [place]  [place] [address]  Fax: [phone]   Corrie Mckusick, M.D.  Fax: [phone]

## 2010-08-22 ENCOUNTER — Other Ambulatory Visit (HOSPITAL_COMMUNITY): Payer: Self-pay | Admitting: Oncology

## 2010-08-22 ENCOUNTER — Other Ambulatory Visit (HOSPITAL_COMMUNITY): Payer: Medicare Other

## 2010-08-22 ENCOUNTER — Encounter (HOSPITAL_COMMUNITY): Payer: Medicare Other | Attending: Oncology

## 2010-08-22 DIAGNOSIS — Z85038 Personal history of other malignant neoplasm of large intestine: Secondary | ICD-10-CM | POA: Insufficient documentation

## 2010-08-22 DIAGNOSIS — Z853 Personal history of malignant neoplasm of breast: Secondary | ICD-10-CM | POA: Insufficient documentation

## 2010-08-22 DIAGNOSIS — C189 Malignant neoplasm of colon, unspecified: Secondary | ICD-10-CM

## 2010-08-30 ENCOUNTER — Other Ambulatory Visit (HOSPITAL_COMMUNITY): Payer: Self-pay | Admitting: Oncology

## 2010-08-30 DIAGNOSIS — C50919 Malignant neoplasm of unspecified site of unspecified female breast: Secondary | ICD-10-CM

## 2010-10-03 ENCOUNTER — Encounter (HOSPITAL_COMMUNITY): Payer: Medicare Other | Attending: Oncology

## 2010-10-03 DIAGNOSIS — Z452 Encounter for adjustment and management of vascular access device: Secondary | ICD-10-CM

## 2010-10-03 DIAGNOSIS — C50919 Malignant neoplasm of unspecified site of unspecified female breast: Secondary | ICD-10-CM

## 2010-10-03 DIAGNOSIS — Z853 Personal history of malignant neoplasm of breast: Secondary | ICD-10-CM | POA: Insufficient documentation

## 2010-10-03 DIAGNOSIS — Z85038 Personal history of other malignant neoplasm of large intestine: Secondary | ICD-10-CM | POA: Insufficient documentation

## 2010-10-03 DIAGNOSIS — C189 Malignant neoplasm of colon, unspecified: Secondary | ICD-10-CM

## 2010-10-11 ENCOUNTER — Ambulatory Visit (HOSPITAL_COMMUNITY)
Admission: RE | Admit: 2010-10-11 | Discharge: 2010-10-11 | Disposition: A | Discharge status: Home / Self Care | Payer: Medicare Other | Source: Ambulatory Visit | Attending: Oncology | Admitting: Oncology

## 2010-10-11 DIAGNOSIS — Z853 Personal history of malignant neoplasm of breast: Secondary | ICD-10-CM | POA: Insufficient documentation

## 2010-10-11 DIAGNOSIS — C50919 Malignant neoplasm of unspecified site of unspecified female breast: Secondary | ICD-10-CM

## 2010-11-14 ENCOUNTER — Encounter (HOSPITAL_COMMUNITY): Payer: Medicare Other | Attending: Oncology

## 2010-11-14 ENCOUNTER — Encounter (HOSPITAL_COMMUNITY): Payer: Medicare Other

## 2010-11-14 ENCOUNTER — Encounter (HOSPITAL_COMMUNITY): Payer: Self-pay

## 2010-11-14 DIAGNOSIS — C50911 Malignant neoplasm of unspecified site of right female breast: Secondary | ICD-10-CM | POA: Insufficient documentation

## 2010-11-14 DIAGNOSIS — C187 Malignant neoplasm of sigmoid colon: Secondary | ICD-10-CM

## 2010-11-14 DIAGNOSIS — C189 Malignant neoplasm of colon, unspecified: Secondary | ICD-10-CM

## 2010-11-14 DIAGNOSIS — C50919 Malignant neoplasm of unspecified site of unspecified female breast: Secondary | ICD-10-CM

## 2010-11-14 HISTORY — DX: Malignant neoplasm of colon, unspecified: C18.9

## 2010-11-14 HISTORY — DX: Malignant neoplasm of unspecified site of right female breast: C50.911

## 2010-11-14 HISTORY — DX: Malignant neoplasm of unspecified site of unspecified female breast: C50.919

## 2010-11-14 HISTORY — DX: Malignant neoplasm of sigmoid colon: C18.7

## 2010-11-14 MED ORDER — HEPARIN SOD (PORK) LOCK FLUSH 100 UNIT/ML IV SOLN
INTRAVENOUS | Status: AC
  Filled 2010-11-14: qty 5

## 2010-11-14 NOTE — Progress Notes (Signed)
Port accessed and flushed per protocol.  Good blood return.  Specimen  Obtained for cea and ca27.29. Tolerated well

## 2010-11-15 LAB — CEA: CEA: <0.5 ng/mL (ref 0.0–5.0)

## 2010-11-15 LAB — CANCER ANTIGEN 27.29: CA 27.29: 14 U/mL (ref 0–39)

## 2010-11-22 ENCOUNTER — Ambulatory Visit (HOSPITAL_COMMUNITY)
Admission: RE | Admit: 2010-11-22 | Discharge: 2010-11-22 | Disposition: A | Discharge status: Home / Self Care | Payer: Medicare Other | Source: Ambulatory Visit | Attending: Family Medicine | Admitting: Family Medicine

## 2010-11-22 ENCOUNTER — Other Ambulatory Visit (HOSPITAL_COMMUNITY): Payer: Self-pay | Admitting: Family Medicine

## 2010-11-22 ENCOUNTER — Encounter (HOSPITAL_COMMUNITY): Payer: Self-pay

## 2010-11-22 DIAGNOSIS — M169 Osteoarthritis of hip, unspecified: Secondary | ICD-10-CM | POA: Insufficient documentation

## 2010-11-22 DIAGNOSIS — M25559 Pain in unspecified hip: Secondary | ICD-10-CM | POA: Insufficient documentation

## 2010-11-22 DIAGNOSIS — X58XXXA Exposure to other specified factors, initial encounter: Secondary | ICD-10-CM | POA: Insufficient documentation

## 2010-11-22 DIAGNOSIS — M1610 Unilateral primary osteoarthritis, unspecified hip: Secondary | ICD-10-CM | POA: Insufficient documentation

## 2010-11-22 DIAGNOSIS — IMO0002 Reserved for concepts with insufficient information to code with codable children: Secondary | ICD-10-CM

## 2010-11-22 HISTORY — DX: Essential (primary) hypertension: I10

## 2010-12-27 ENCOUNTER — Encounter (HOSPITAL_COMMUNITY): Payer: Medicare Other | Attending: Oncology

## 2010-12-27 DIAGNOSIS — C189 Malignant neoplasm of colon, unspecified: Secondary | ICD-10-CM | POA: Insufficient documentation

## 2010-12-27 DIAGNOSIS — C50919 Malignant neoplasm of unspecified site of unspecified female breast: Secondary | ICD-10-CM | POA: Insufficient documentation

## 2010-12-27 DIAGNOSIS — Z452 Encounter for adjustment and management of vascular access device: Secondary | ICD-10-CM

## 2010-12-27 MED ORDER — HEPARIN SOD (PORK) LOCK FLUSH 100 UNIT/ML IV SOLN
INTRAVENOUS | Status: AC
  Filled 2010-12-27: qty 5

## 2010-12-27 MED ORDER — SODIUM CHLORIDE 0.9 % IJ SOLN
INTRAMUSCULAR | Status: AC
  Filled 2010-12-27: qty 10

## 2010-12-27 NOTE — Progress Notes (Signed)
Patricia Kline presented for Portacath access and flush. Proper placement of portacath confirmed by CXR. Portacath located left chest wall accessed with  H 20 needle. Good blood return present. Portacath flushed with 20ml NS and 500U/2ml Heparin and needle removed intact. Procedure without incident. Patient tolerated procedure well.

## 2010-12-28 LAB — CANCER ANTIGEN 27.29: CA 27.29: 13

## 2010-12-28 LAB — CEA: CEA: <0.5

## 2011-01-02 LAB — CBC
Hemoglobin: 12.1
MCHC: 33.9
Platelets: 159
RDW: 13.2

## 2011-01-02 LAB — COMPREHENSIVE METABOLIC PANEL
AST: 16
BUN: 12
CO2: 27
Calcium: 9.2
Chloride: 106
Creatinine, Ser: 0.95
GFR calc Af Amer: >60
GFR calc non Af Amer: 59 — ABNORMAL LOW
Glucose, Bld: 105 — ABNORMAL HIGH
Total Bilirubin: 0.6

## 2011-01-02 LAB — DIFFERENTIAL
Basophils Absolute: 0
Eosinophils Absolute: 0
Eosinophils Relative: 1
Lymphocytes Relative: 24
Neutrophils Relative %: 66

## 2011-01-02 LAB — CEA: CEA: 1.1

## 2011-01-02 LAB — CANCER ANTIGEN 27.29: CA 27.29: 14

## 2011-01-05 ENCOUNTER — Other Ambulatory Visit (HOSPITAL_COMMUNITY): Payer: Medicare Other

## 2011-01-05 LAB — CEA: CEA: <0.5

## 2011-01-05 LAB — CANCER ANTIGEN 27.29: CA 27.29: 9

## 2011-01-09 ENCOUNTER — Encounter (HOSPITAL_COMMUNITY): Payer: Medicare Other | Attending: Oncology | Admitting: Oncology

## 2011-01-09 ENCOUNTER — Encounter (HOSPITAL_COMMUNITY): Payer: Self-pay | Admitting: Oncology

## 2011-01-09 DIAGNOSIS — C50919 Malignant neoplasm of unspecified site of unspecified female breast: Secondary | ICD-10-CM

## 2011-01-09 DIAGNOSIS — M899 Disorder of bone, unspecified: Secondary | ICD-10-CM

## 2011-01-09 DIAGNOSIS — C189 Malignant neoplasm of colon, unspecified: Secondary | ICD-10-CM | POA: Insufficient documentation

## 2011-01-09 DIAGNOSIS — Z23 Encounter for immunization: Secondary | ICD-10-CM

## 2011-01-09 NOTE — Progress Notes (Signed)
Patricia Kline presents today for injection per MD orders. Flu vaccine administered IM in left Upper Arm. Administration without incident. Patient tolerated well.

## 2011-01-09 NOTE — Patient Instructions (Addendum)
[facility]  Discharge Instructions  RECOMMENDATIONS MADE BY THE CONSULTANT AND ANY TEST RESULTS WILL BE SENT TO YOUR REFERRING DOCTOR.   EXAM FINDINGS BY MD TODAY AND SIGNS AND SYMPTOMS TO REPORT TO CLINIC OR PRIMARY MD: doing well.  Will schedule consult with Cardiologist.  Need to check your tumor markers every 12 weeks.  MEDICATIONS PRESCRIBED: none   INSTRUCTIONS GIVEN AND DISCUSSED:  Will give you the flu vaccine today.   SPECIAL INSTRUCTIONS/FOLLOW-UP: Lab work Needed every 12 weeks, Return to Clinic to see Dr. Mariel Sleet in 6 months  and Other (Referral/Appointments) to see Dr. Alanda Amass  On 10/15 at 2pm.   I acknowledge that I have been informed and understand all the instructions given to me and received a copy. I do not have any more questions at this time, but understand that I may call the Specialty Clinic at [facility] at [phone] during business hours should I have any further questions or need assistance in obtaining follow-up care.    __________________________________________  _____________  __________ Signature of Patient or Authorized Representative            Date                   Time    __________________________________________ Nurse's Signature

## 2011-01-09 NOTE — Progress Notes (Signed)
CC:   Corrie Mckusick, M.D. Dalia Heading, M.D. Maryln Gottron, M.D. Jonathon Bellows, MD FACP [facility]. Alanda Amass, M.D.  DIAGNOSIS: 1. Stage III adenocarcinoma of sigmoid colon with resection on     05/29/2004 for a 6.0-cm primary, 6 of 15 positive nodes, status     post 6 cycles of oxaliplatin and capecitabine thus far without     recurrence with negative colonoscopy in May 2010 and Dr. Jena Gauss has     recommended a repeat in May 2015. 2. 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  The tumor was 2.0 cm pathologically, status     post lumpectomy and sentinel node biopsy followed by radiation     therapy and then treated with adjuvant Arimidex originally then     Femara.  She did develop the inability to tolerate Arimidex then     was placed on tamoxifen which she finished 5 full years of hormonal     therapy.  Clinically, she had stage II disease at presentation due     to the size of the mass clinically and radiographically by MRI,     etc. 3. Shortness of breath and rapid heart rate  during the summer while     cutting grass with a push mower, which she had this stopped for     several minutes to catch her breath and have her heart slow down in     the setting of mitral valve prolapse.  She also has a history of     sudden MI in her father at age 1 which took his life and her     mother also has history of heart disease.  I do think that a     cardiology opinion would be reasonable at this juncture. 4. The retinal hole repair on the left by Dr. Fawn Kirk. 5. Right retinal hole repair by Dr. Luciana Axe in the past. 6. Benign breast biopsy in 2000. 7. Bilateral cataract operations in the past. 8. Osteoporosis on Zometa along with calcium and vitamin D.  She is     due for her Zometa this  December. 9. Hypercholesterolemia. 10.Hypertension. 11.Right-sided breast pain intermittently since her surgery and     radiation, that is stable. 12.Mitral valve prolapse with click and murmur, once again with a     murmur and click both present today. 13.Peripheral neuropathy on gabapentin 300 mg q.i.d. since her     chemotherapy for colon cancer. 14.Depression much improved on Celexa. 15.Mild thrombocytopenia. Patricia Kline also had some left hip discomfort which got her a hip x-ray on the left in August which showed mild hip osteoarthritis only.  She has also had mammography in July which was totally negative.  She had lab work in December but she states she had lab work also by Dr. Phillips Odor in August so we will get that blood work.  She has intermittently had low platelets since her chemotherapy.  That is not new or different.  What bothers me on her review of systems is this heart thumping and rapid heart rate and shortness of breath during this summer.  She wants to blame it just on the heat and the fact that they are cutting 3 lawns, namely her and husband, a couple  times a week but she has got this family history of heart disease in her mother and her father with the only risk factors for her father being cholesterol.  Her mother is taken care of by Dr. Andree Coss and I thought a consult with Dr. Alanda Amass or Dr. Allyson Sabal would be reasonable.  This does bother me.  Her heart rate today is only 56.  She does have a short mid systolic murmur with a click as well today.  I do not always hear these things on her.  The rest of her review of systems oncologically is negative.  She denies chest pain herself.  She has no shortness of breath at night, sleeps only on 1 pillow, etc.  PHYSICAL EXAMINATION:  Vital signs:  Today shows that her weight is 114 pounds, height 5 feet 4 inches, BMI is 19.7, blood pressure today 137/62 left arm sitting position, pulse 56 and regular, respirations 16  and unlabored.  Temperature is normal.  General:  She denies any pain presently.  Her skin is warm and dry to the touch.  She is in no acute distress.  Facial symmetry appears intact.  She has no adenopathy.  No thyromegaly.  She has no adenopathy in the axillary, cervical, infraclavicular, supraclavicular, or inguinal areas.  Lungs:  Her lungs are clear today.  Heart:  Does show the short midsystolic murmur and click consistent with MVP.  Breasts:  She has a negative breast exam except for surgical changes bilaterally.  She has no masses.  Abdomen: Soft and nontender without organomegaly.  Bowel sounds are normal. Extremities:  She has no peripheral edema.  She has done very, very well.  We spent about 35-45 minutes talking about this heart issue, counseling her about things in general to do with her heart and she also has a port still present in left upper chest wall which she can have removed at her discretion.  She is out a number of years, still wants to be seen every 6 months which is fine and I think this year we will do her cancer markers every 3 months for the last time and then go to every 6 months next time a year goes by.  So will get Dr. Kandis Cocking input.  I would feel better about things myself and we will see her back.    ______________________________ Ladona Horns. Mariel Sleet, MD ESN/MEDQ  D:  01/09/2011  T:  01/09/2011  Job:  [id number]

## 2011-01-09 NOTE — Progress Notes (Signed)
This office note has been dictated.

## 2011-01-11 LAB — CEA: CEA: <0.5

## 2011-02-07 ENCOUNTER — Encounter (HOSPITAL_COMMUNITY): Payer: Medicare Other | Attending: Oncology

## 2011-02-07 DIAGNOSIS — C50919 Malignant neoplasm of unspecified site of unspecified female breast: Secondary | ICD-10-CM | POA: Insufficient documentation

## 2011-02-07 DIAGNOSIS — C189 Malignant neoplasm of colon, unspecified: Secondary | ICD-10-CM

## 2011-02-07 MED ORDER — SODIUM CHLORIDE 0.9 % IJ SOLN
INTRAMUSCULAR | Status: AC
  Administered 2011-02-07: 10 mL via INTRAVENOUS
  Filled 2011-02-07: qty 10

## 2011-02-07 MED ORDER — HEPARIN SOD (PORK) LOCK FLUSH 100 UNIT/ML IV SOLN
INTRAVENOUS | Status: AC
  Administered 2011-02-07: 500 [IU] via INTRAVENOUS
  Filled 2011-02-07: qty 5

## 2011-02-07 MED ORDER — HEPARIN SOD (PORK) LOCK FLUSH 100 UNIT/ML IV SOLN
500.0000 [IU] | Freq: Once | INTRAVENOUS | Status: AC
  Administered 2011-02-07: 500 [IU] via INTRAVENOUS
  Filled 2011-02-07: qty 5

## 2011-02-07 MED ORDER — SODIUM CHLORIDE 0.9 % IJ SOLN
10.0000 mL | INTRAMUSCULAR | Status: DC | PRN
  Administered 2011-02-07: 10 mL via INTRAVENOUS
  Filled 2011-02-07: qty 10

## 2011-02-07 NOTE — Progress Notes (Signed)
Port accessed and flushed per clinic protocol.  Specimen obtained from port for labs.  Tolerated well.

## 2011-02-08 LAB — CANCER ANTIGEN 27.29: CA 27.29: 13 U/mL (ref 0–39)

## 2011-03-05 ENCOUNTER — Encounter (HOSPITAL_COMMUNITY): Payer: Medicare Other | Attending: Oncology

## 2011-03-05 ENCOUNTER — Encounter (HOSPITAL_COMMUNITY): Payer: Self-pay

## 2011-03-05 DIAGNOSIS — M810 Age-related osteoporosis without current pathological fracture: Secondary | ICD-10-CM

## 2011-03-05 DIAGNOSIS — Z853 Personal history of malignant neoplasm of breast: Secondary | ICD-10-CM

## 2011-03-05 DIAGNOSIS — Z85038 Personal history of other malignant neoplasm of large intestine: Secondary | ICD-10-CM

## 2011-03-05 DIAGNOSIS — C50919 Malignant neoplasm of unspecified site of unspecified female breast: Secondary | ICD-10-CM | POA: Insufficient documentation

## 2011-03-05 DIAGNOSIS — C189 Malignant neoplasm of colon, unspecified: Secondary | ICD-10-CM

## 2011-03-05 HISTORY — DX: Age-related osteoporosis without current pathological fracture: M81.0

## 2011-03-05 LAB — COMPREHENSIVE METABOLIC PANEL
ALT: 10 U/L (ref 0–35)
AST: 12 U/L (ref 0–37)
CO2: 26 mEq/L (ref 19–32)
Chloride: 100 mEq/L (ref 96–112)
GFR calc non Af Amer: 46 mL/min — ABNORMAL LOW (ref >90)
Glucose, Bld: 85 mg/dL (ref 70–99)
Sodium: 136 mEq/L (ref 135–145)
Total Bilirubin: 0.4 mg/dL (ref 0.3–1.2)

## 2011-03-05 MED ORDER — SODIUM CHLORIDE 0.9 % IJ SOLN
INTRAMUSCULAR | Status: AC
  Administered 2011-03-05: 10 mL via INTRAVENOUS
  Filled 2011-03-05: qty 10

## 2011-03-05 MED ORDER — HEPARIN SOD (PORK) LOCK FLUSH 100 UNIT/ML IV SOLN
500.0000 [IU] | Freq: Once | INTRAVENOUS | Status: AC
  Administered 2011-03-05: 500 [IU] via INTRAVENOUS
  Filled 2011-03-05: qty 5

## 2011-03-05 MED ORDER — SODIUM CHLORIDE 0.9 % IV SOLN
INTRAVENOUS | Status: DC
  Administered 2011-03-05: 11:00:00 via INTRAVENOUS

## 2011-03-05 MED ORDER — SODIUM CHLORIDE 0.9 % IJ SOLN
10.0000 mL | INTRAMUSCULAR | Status: DC | PRN
  Administered 2011-03-05: 10 mL via INTRAVENOUS
  Filled 2011-03-05: qty 10

## 2011-03-05 MED ORDER — DENOSUMAB 120 MG/1.7ML ~~LOC~~ SOLN: 120.0000 mg | Freq: Once | SUBCUTANEOUS | Status: DC

## 2011-03-05 MED ORDER — HEPARIN SOD (PORK) LOCK FLUSH 100 UNIT/ML IV SOLN
INTRAVENOUS | Status: AC
  Administered 2011-03-05: 500 [IU] via INTRAVENOUS
  Filled 2011-03-05: qty 5

## 2011-03-05 MED ORDER — DENOSUMAB 60 MG/ML ~~LOC~~ SOLN
60.0000 mg | Freq: Once | SUBCUTANEOUS | Status: AC
  Administered 2011-03-05: 60 mg via SUBCUTANEOUS
  Filled 2011-03-05: qty 1

## 2011-03-05 NOTE — Progress Notes (Signed)
Bun and creat slightly up from last dose zometa   T. Kefalas aware.  Zometa changed to prolia 60 mg every 6 months.  Tolerated well.

## 2011-03-05 NOTE — Progress Notes (Signed)
Patient seen to switch osteoporosis medication.  BUN and creatinine has increased.  I therefore had a discussion with the patient about switching medication to Denosumab 60 mg every 6 months.  I went over the pros and cons about the medications.  We went over the risks, benefits, and alternatives to switching to denosumab.  She has agreed to switch osteoporosis medication to denosumab 60 mg every 6 months.  She was encouraged to continue her Calcium and Vitamin D.   Patricia Kline

## 2011-03-07 ENCOUNTER — Other Ambulatory Visit (HOSPITAL_COMMUNITY): Payer: Self-pay | Admitting: Physician Assistant

## 2011-03-07 ENCOUNTER — Ambulatory Visit (HOSPITAL_COMMUNITY)
Admission: RE | Admit: 2011-03-07 | Discharge: 2011-03-07 | Disposition: A | Discharge status: Home / Self Care | Payer: Medicare Other | Source: Ambulatory Visit | Attending: Physician Assistant | Admitting: Physician Assistant

## 2011-03-07 DIAGNOSIS — M25569 Pain in unspecified knee: Secondary | ICD-10-CM

## 2011-03-07 DIAGNOSIS — M76899 Other specified enthesopathies of unspecified lower limb, excluding foot: Secondary | ICD-10-CM

## 2011-03-07 DIAGNOSIS — M25559 Pain in unspecified hip: Secondary | ICD-10-CM | POA: Insufficient documentation

## 2011-03-20 ENCOUNTER — Ambulatory Visit (INDEPENDENT_AMBULATORY_CARE_PROVIDER_SITE_OTHER): Payer: Medicare Other | Admitting: Orthopedic Surgery

## 2011-03-20 ENCOUNTER — Encounter: Payer: Self-pay | Admitting: Orthopedic Surgery

## 2011-03-20 VITALS — BP 140/70 | Ht 64.0 in | Wt 114.0 lb

## 2011-03-20 DIAGNOSIS — M479 Spondylosis, unspecified: Secondary | ICD-10-CM

## 2011-03-20 NOTE — Patient Instructions (Signed)
We did not find any problem in your knee or hip. I feel that the pain is coming from your back. It is not a surgical condition. But you will need therapy and medication for pain. I am referring you to therapy and back to your primary doctors for further treatment. Back Pain, Adult Low back pain is very common. About 1 in 5 people have back pain. The cause of low back pain is rarely dangerous. The pain often gets better over time. About half of people with a sudden onset of back pain feel better in just 2 weeks. About 8 in 10 people feel better by 6 weeks.   CAUSES Some common causes of back pain include:  Strain of the muscles or ligaments supporting the spine.     Wear and tear (degeneration) of the spinal discs.     Arthritis.    Direct injury to the back.  DIAGNOSIS Most of the time, the direct cause of low back pain is not known. However, back pain can be treated effectively even when the exact cause of the pain is unknown. Answering your caregiver's questions about your overall health and symptoms is one of the most accurate ways to make sure the cause of your pain is not dangerous. If your caregiver needs more information, he or she may order lab work or imaging tests (X-rays or MRIs). However, even if imaging tests show changes in your back, this usually does not require surgery. HOME CARE INSTRUCTIONS For many people, back pain returns. Since low back pain is rarely dangerous, it is often a condition that people can learn to manage on their own.    Remain active. It is stressful on the back to sit or stand in one place. Do not sit, drive, or stand in one place for more than 30 minutes at a time. Take short walks on level surfaces as soon as pain allows. Try to increase the length of time you walk each day.     Do not stay in bed. Resting more than 1 or 2 days can delay your recovery.     Do not avoid exercise or work. Your body is made to move. It is not dangerous to be active, even  though your back may hurt. Your back will likely heal faster if you return to being active before your pain is gone.     Pay attention to your body when you  bend and lift. Many people have less discomfort when lifting if they bend their knees, keep the load close to their bodies, and avoid twisting. Often, the most comfortable positions are those that put less stress on your recovering back.     Find a comfortable position to sleep. Use a firm mattress and lie on your side with your knees slightly bent. If you lie on your back, put a pillow under your knees.     Only take over-the-counter or prescription medicines as directed by your caregiver. Over-the-counter medicines to reduce pain and inflammation are often the most helpful. Your caregiver may prescribe muscle relaxant drugs. These medicines help dull your pain so you can more quickly return to your normal activities and healthy exercise.     Put ice on the injured area.     Put ice in a plastic bag.     Place a towel between your skin and the bag.     Leave the ice on for 15 to 20 minutes, 3 to 4 times a day for  the first 2 to 3 days. After that, ice and heat may be alternated to reduce pain and spasms.     Ask your caregiver about trying back exercises and gentle massage. This may be of some benefit.     Avoid feeling anxious or stressed. Stress increases muscle tension and can worsen back pain. It is important to recognize when you are anxious or stressed and learn ways to manage it. Exercise is a great option.  SEEK MEDICAL CARE IF:  You have pain that is not relieved with rest or medicine.     You have pain that does not improve in 1 week.     You have new symptoms.     You are generally not feeling well.  SEEK IMMEDIATE MEDICAL CARE IF:    You have pain that radiates from your back into your legs.     You develop new bowel or bladder control problems.     You have unusual weakness or numbness in your arms or legs.      You develop nausea or vomiting.     You develop abdominal pain.     You feel faint.  Document Released: 03/19/2005 Document Revised: 11/29/2010 Document Reviewed: 08/07/2010 [facility] Patient Information 2012 [place], [place].

## 2011-03-21 ENCOUNTER — Encounter (HOSPITAL_BASED_OUTPATIENT_CLINIC_OR_DEPARTMENT_OTHER): Payer: Medicare Other

## 2011-03-21 ENCOUNTER — Encounter: Payer: Self-pay | Admitting: Orthopedic Surgery

## 2011-03-21 DIAGNOSIS — M479 Spondylosis, unspecified: Secondary | ICD-10-CM | POA: Insufficient documentation

## 2011-03-21 DIAGNOSIS — C50919 Malignant neoplasm of unspecified site of unspecified female breast: Secondary | ICD-10-CM

## 2011-03-21 DIAGNOSIS — C189 Malignant neoplasm of colon, unspecified: Secondary | ICD-10-CM

## 2011-03-21 MED ORDER — SODIUM CHLORIDE 0.9 % IJ SOLN
INTRAMUSCULAR | Status: AC
  Administered 2011-03-21: 10 mL via INTRAVENOUS
  Filled 2011-03-21: qty 10

## 2011-03-21 MED ORDER — HEPARIN SOD (PORK) LOCK FLUSH 100 UNIT/ML IV SOLN
INTRAVENOUS | Status: AC
  Administered 2011-03-21: 500 [IU] via INTRAVENOUS
  Filled 2011-03-21: qty 5

## 2011-03-21 MED ORDER — SODIUM CHLORIDE 0.9 % IJ SOLN
10.0000 mL | INTRAMUSCULAR | Status: DC | PRN
  Administered 2011-03-21: 10 mL via INTRAVENOUS
  Filled 2011-03-21: qty 10

## 2011-03-21 MED ORDER — HEPARIN SOD (PORK) LOCK FLUSH 100 UNIT/ML IV SOLN
500.0000 [IU] | Freq: Once | INTRAVENOUS | Status: AC
  Administered 2011-03-21: 500 [IU] via INTRAVENOUS
  Filled 2011-03-21: qty 5

## 2011-03-21 NOTE — Progress Notes (Signed)
Tolerated port flush well.  Good blood return. 

## 2011-03-21 NOTE — Progress Notes (Signed)
Patient ID: Patricia Kline, female   DOB: May 03, 1940, 70 y.o.   MRN: [id number]   LEFT knee pain  Pain started December 2.  Pain came on gradually.  The pain is described as sharp throbbing 8/10 constant pain in the LEFT leg centered at the knee worse with standing and improved by sitting or occurring in the day and night.    Review of systems heart murmurs joint pain numbness tingling nervousness depression and easy bruising.  She is ALLERGIC to penicillin  Past Medical History  Diagnosis Date  . Breast ca 11/14/2010  . Colon cancer 11/14/2010  . Hypertension   . Osteoporosis   . Hypercholesteremia   . Arthritis   . Peripheral neuropathy   . Depression   . Mitral valve prolapse   . Osteoporosis, senile 03/05/2011   Past Surgical History  Procedure Date  . Cataract extraction, bilateral   . Retinal hole repair     right  . Abdominal hysterectomy   . Tonsillectomy   . Knee surgery      General appearance is normal.  She has normal pulse perfusion to her lower extremities.  She has no lymphadenopathy her skin is warm dry and intact with no rash or lesion.  She is awake alert and oriented x3 her mood and affect are normal  Her musculoskeletal system shows that she has a limp favoring the LEFT lower extremity.  Inspection and palpation of the LEFT knee revealed no swelling no tenderness.  She has normal range of motion the LEFT knee and hip.  Strength assessment as 5 over 5 in the thigh and ankle musculature.  The knee and hip are stable.  She does appear to have a positive straight leg raise at proximal 40.  Impression x-rays were done prior to her visit and they do not show any pathology in the knee or the hip.  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  We will start treatment with physical therapy, she should continue her medications and if she doesn't improve after 6 weeks of therapy she should have an MRI of her back.

## 2011-03-23 ENCOUNTER — Ambulatory Visit (HOSPITAL_COMMUNITY)
Admission: RE | Admit: 2011-03-23 | Discharge: 2011-03-23 | Disposition: A | Discharge status: Home / Self Care | Payer: Medicare Other | Source: Ambulatory Visit | Attending: Orthopedic Surgery | Admitting: Orthopedic Surgery

## 2011-03-23 ENCOUNTER — Encounter (HOSPITAL_COMMUNITY): Payer: Self-pay

## 2011-03-23 DIAGNOSIS — M545 Low back pain, unspecified: Secondary | ICD-10-CM | POA: Insufficient documentation

## 2011-03-23 DIAGNOSIS — IMO0001 Reserved for inherently not codable concepts without codable children: Secondary | ICD-10-CM | POA: Insufficient documentation

## 2011-03-23 DIAGNOSIS — M79605 Pain in left leg: Secondary | ICD-10-CM | POA: Insufficient documentation

## 2011-03-23 DIAGNOSIS — M25569 Pain in unspecified knee: Secondary | ICD-10-CM | POA: Insufficient documentation

## 2011-03-23 DIAGNOSIS — M6281 Muscle weakness (generalized): Secondary | ICD-10-CM | POA: Insufficient documentation

## 2011-03-23 HISTORY — DX: Acquired absence of both cervix and uterus: Z90.710

## 2011-03-23 HISTORY — DX: Other specified postprocedural states: Z98.890

## 2011-03-23 HISTORY — DX: Cataract extraction status, unspecified eye: Z98.49

## 2011-03-23 NOTE — Progress Notes (Signed)
Physical Therapy Evaluation  Patient Details  Name: Patricia Kline MRN: [id number] Date of Birth: March 27, 1941  Today's Date: 03/23/2011 Time: 9147-8295 Time Calculation (min): 45 min Charges: 1 EV Visit#: 1  of 16   Re-eval: 04/22/11  Assessment Diagnosis: knee pain radiating from low back Next MD Visit: come back after therapy if it did not work. Prior Therapy: none  Subjective Symptoms/Limitations Symptoms: Left knee pain referring from low back.  Has lost 10 lbs this summer, but she thinks it was from keeping up 3 yards.  She is actively still being followed by her oncologist and her most recent blood work was good.  Her oncologist does know about the leg pain.   Limitations: Lifting;Standing;Walking;House hold activities How long can you sit comfortably?: no problem How long can you stand comfortably?: not more than 5 mins- limits her cooking and cleaning ability. How long can you walk comfortably?: 5 mins before she feels like she needs to rest.   Repetition: Increases Symptoms Special Tests: x rays done on the back (+) for arthritis, x-rays done on knee previously (-) for boney abnormalities.   Pain Assessment Currently in Pain?: Yes (no back pain) Pain Score: 10-Worst pain ever Pain Location: Knee Pain Orientation: Left Pain Type: Acute pain Pain Onset: 1 to 4 weeks ago Pain Frequency: Constant Pain Relieving Factors: pain medicine, tylenol arthritis, has tried ice, didn't seem to help, asper cream does help.   Effect of Pain on Daily Activities: standing up for longer periods of time causes problems.  She cannot cook the way she used to for this reason.    Precautions/Restrictions  Precautions Precaution Comments: h/o cancer  Prior Functioning  Home Living Lives With: Spouse Prior Function Level of Independence: Independent with basic ADLs;Independent with gait;Independent with transfers;Needs assistance with homemaking Able to Take Stairs?: Yes Driving:  Yes Vocation: Retired Leisure: Hobbies-yes (Comment) Comments: very active in sewing, yard work, gardening.    Sensation/Coordination/Flexibility Flexibility Thomas: Positive (left leg, negative R leg)  Assessment  RLE Assessment  RLE Strength RLE Overall Strength: Within Functional Limits for tasks assessed  LLE Assessment LLE Strength LLE Overall Strength: Within Functional Limits for tasks assessed  Lumbar Assessment Lumbar AROM Lumbar Flexion: Decreased 10% (patient was bending knees) Lumbar Extension: Decreasd 50%  (painful into left thigh) Lumbar - Right Side Bend: WFL Lumbar - Left Side Bend: WFL Lumbar - Right Rotation: WFL (painful stretching sensation into left leg) Lumbar - Left Rotation: WFL (painful pulling sensation into left leg.  ) Lumbar Strength Lumbar Flexion: 3-/5 Lumbar Extension: 3+/5 (tested in sitting secondary to patient could not get to sup)  Exercise/Treatments Stretches Lower Trunk Rotation: Limitations Lower Trunk Rotation Limitations: 10 reps bil within painfree ROM Prone Mid Back Stretch: 1 rep;60 seconds;Limitations Prone Mid Back Stretch Limitations: attempted prone on 3 pillows, uncomfortable, but she has very little spinal mobility, so continue prone stretch on 3 pillows x 1 minute.   Lumbar Exercises Stability Ab Set: Supine;10 reps;5 seconds  Physical Therapy Assessment and Plan  Clinical Impression Statement: 70 y.o female presents with left thigh and knee pain for ~ 1 month now.  She says that she has had x-rays on her knee (-) and on her back (+) for arthritis and Dr. Romeo Apple believes that her left leg pain is a referral from her back.  She presents with decreased lumbar ROM and decreased core strength.  She would benefit from skilled PT to maximize independence, functional mobility and strength so that she may  return to her prior level of functioning.   Rehab Potential: Good PT Frequency: Min 3X/week PT Duration: 8 weeks PT  Treatment/Interventions: DME instruction;Gait training;Stair training;Functional mobility training;Therapeutic activities;Therapeutic exercise;Balance training;Neuromuscular re-education;Patient/family education PT Plan: 3xs/wk x 8 weeks, add pelvic tilts both anterior and posterior in supine, active hamstring, single and double knee to chest, marches with TA activation, clams and review HEP given (TA activation and  lower trunk rotation).. No modalities secondary to h/o cancer    Goals Home Exercise Program Pt will Perform Home Exercise Program: Independently PT Short Term Goals Time to Complete Short Term Goals: 4 weeks PT Short Term Goal 1: Patient will report reduction in daily pain to less than or equal to 5/10 PT Short Term Goal 2: Patient will report that she is able to stand at her sink to cook or clean for greater than 10 minutes without having to sit down.   PT Short Term Goal 3: The pateint will be able to walk greater than 5 minutes without stopping due to pain.   PT Long Term Goals Time to Complete Long Term Goals: 8 weeks PT Long Term Goal 1: The patient will report a reduction in daily pain of less than or equal to 3/10 to improve QOL and daily acitivity tolerance.   PT Long Term Goal 2: The patient will increase core muscle strength to greater than or equal to 4/5 to show improved strength and spinal stability.   Long Term Goal 3: The patient will increase spinal ROM to [facility] for her age to show improved spinal mobility and flexibility.   Long Term Goal 4: The patient will be able to lie flat on the bed or table without any pillows under her pelvis without discomfort to show improved lower back extension and mmobility.    PT - End of Session Activity Tolerance: Patient limited by pain   Willis Holquin B. Nga Rabon, PT, DPT   03/23/2011, 11:00 AM  Physician Documentation Your signature is required to indicate approval of the treatment plan as stated above.  Please sign and either  send electronically or make a copy of this report for your files and return this physician signed original.   Please mark one 1.__approve of plan  2. ___approve of plan with the following conditions.   ______________________________                                                          _____________________ Physician Signature                                                                                                             Date

## 2011-03-28 ENCOUNTER — Ambulatory Visit (HOSPITAL_COMMUNITY)
Admission: RE | Admit: 2011-03-28 | Discharge: 2011-03-28 | Disposition: A | Discharge status: Home / Self Care | Payer: Medicare Other | Source: Ambulatory Visit | Attending: Family Medicine | Admitting: Family Medicine

## 2011-03-28 DIAGNOSIS — M79605 Pain in left leg: Secondary | ICD-10-CM

## 2011-03-28 NOTE — Progress Notes (Signed)
Physical Therapy Treatment Patient Details  Name: CALLEIGH LAFONTANT MRN: [id number] Date of Birth: October 09, 1940  Today's Date: 03/28/2011 Time: 6962-9528 Time Calculation (min): 48 min Visit#: 2  of 16   Re-eval: 04/22/11  Charge: therex 45 min  Subjective: Symptoms/Limitations Symptoms: Christmas Eve was really painful L knee and L hip, pain scale right 8/10 pt stated this dampness is not helping.  Pt reported compliance with HEP and able to demonstrate exercises with min cueing for tech. Pain Assessment Currently in Pain?: Yes Pain Score:   8 Pain Location: Knee Pain Orientation: Left  Precautions/Restrictions  Precautions Precautions: Other (comment) Precaution Comments: no modalities for pain history of cancer  Objective:   Exercise/Treatments Stretches Active Hamstring Stretch: 3 reps;30 seconds;Limitations Active Hamstring Stretch Limitations: bilateral with rope Single Knee to Chest Stretch: 3 reps;30 seconds;Limitations Single Knee to Chest Stretch Limitations: bilateral Double Knee to Chest Stretch: 2 reps;30 seconds Lower Trunk Rotation: Limitations Lower Trunk Rotation Limitations: 10 reps bil; 10 sec holds within pain free ROM; anterior/posterior rotation supine 10x 5" holds each Prone Mid Back Stretch: 1 rep;60 seconds;Limitations Prone Mid Back Stretch Limitations: attempted prone on 3 pillows, uncomfortable, but she has very little spinal mobility, so continue prone stretch on 3 pillows x 1 minute.   Stability Clam: Supine;10 reps;5 seconds Bridge: Supine;10 reps Bent Knee Raise: Supine;10 reps Ab Set: Supine;10 reps;5 seconds  Physical Therapy Assessment and Plan PT Assessment and Plan Clinical Impression Statement: All HEP demonstrated correctly with no cueing required for LTR and TA activitation.  Pt educated on proper tech getting in and out of bed.  Pt able to follow all instructions for new exercises with cueing/demonstration required for proper tech with  noted instability with clam and anterior/posterior pelvic rotation. PT Plan: Continue with current POC, address posture and knee strengthening, no modalities for pain history of cancer.    Goals    Problem List Patient Active Problem List  Diagnoses  . Breast ca  . Colon cancer  . Osteoporosis, senile  . Arthritis of back  . Left leg pain    PT - End of Session Activity Tolerance: Patient tolerated treatment well General Behavior During Session: [facility] for tasks performed Cognition: [facility] for tasks performed  Juel Burrow 03/28/2011, 10:41 AM

## 2011-03-29 ENCOUNTER — Ambulatory Visit (HOSPITAL_COMMUNITY)
Admission: RE | Admit: 2011-03-29 | Discharge: 2011-03-29 | Disposition: A | Discharge status: Home / Self Care | Payer: Medicare Other | Source: Ambulatory Visit | Attending: Family Medicine | Admitting: Family Medicine

## 2011-03-29 DIAGNOSIS — M79605 Pain in left leg: Secondary | ICD-10-CM

## 2011-03-29 NOTE — Progress Notes (Signed)
Physical Therapy Treatment Patient Details  Name: KAYLEEN ALIG MRN: [id number] Date of Birth: 1940/06/13  Today's Date: 03/29/2011 Time: 0805-0850 Time Calculation (min): 45 min Visit#: 3  of 16   Re-eval: 04/22/11  Charge: therex 45 min  Subjective:  Pt stated she was sore from yesterday, did some exercises on the floor last night and it felt better. Pt stated she is performing her HEP and riding her bike at home, now up to 12 minutes Pain yes 5 back   Objective:   Exercise/Treatments Stretches Active Hamstring Stretch: 3 reps;30 seconds;Limitations Active Hamstring Stretch Limitations: bilateral with rope Single Knee to Chest Stretch: 3 reps;30 seconds;Limitations Single Knee to Chest Stretch Limitations: bilateral Double Knee to Chest Stretch: 2 reps;30 seconds Lower Trunk Rotation: Limitations Lower Trunk Rotation Limitations: 10 reps bil; 10 sec holds within pain free ROM; anterior/posterior rotation supine 10x 5" holds each; Hip IR/ER with heels/toes 10 repsx 5 " wtith good posture and diaphragmatic breathing Prone Mid Back Stretch: 1 rep;60 seconds;Limitations Prone Mid Back Stretch Limitations: 3 pillows Stability Clam: Supine;10 reps;5 seconds Bridge: Supine;10 reps;3 seconds Bent Knee Raise: 10 reps;3 seconds Ab Set: 10 reps;5 seconds Heel Squeeze: 5 reps;5 seconds;Limitations Heel Squeeze Limitations: with 3 pillows under hips Functional Squats: 10 reps  Physical Therapy Assessment and Plan PT Assessment and Plan Clinical Impression Statement: Added lumbar/LE strengthening activities, pt able to perform with cueing for posture, form and technique.  Pt still uncomfortable in prone position but able to tolerate with 3 pillows below hips and able to perform the heel squeeze exercises with no c/o.  Added seated hip IR/ER for hip mobility and strengthening , pt performed correctly with min cueing for posture. PT Plan: Continue with current POC, address posture and knee  strengthening, no modalities for pain history of cancer    Goals    Problem List Patient Active Problem List  Diagnoses  . Breast ca  . Colon cancer  . Osteoporosis, senile  . Arthritis of back  . Left leg pain    PT - End of Session Activity Tolerance: Patient tolerated treatment well General Behavior During Session: [facility] for tasks performed Cognition: [facility] for tasks performed  Juel Burrow 03/29/2011, 12:22 PM

## 2011-04-02 ENCOUNTER — Ambulatory Visit (HOSPITAL_COMMUNITY)
Admission: RE | Admit: 2011-04-02 | Discharge: 2011-04-02 | Disposition: A | Discharge status: Home / Self Care | Payer: Medicare Other | Source: Ambulatory Visit | Attending: Family Medicine | Admitting: Family Medicine

## 2011-04-02 DIAGNOSIS — M79605 Pain in left leg: Secondary | ICD-10-CM

## 2011-04-02 NOTE — Progress Notes (Signed)
Physical Therapy Treatment Patient Details  Name: Patricia Kline MRN: [id number] Date of Birth: [birth year]  Today's Date: 04/02/2011 Time: 4098-1191 Time Calculation (min): 40 min Charges: 40 TE Visit#: 4  of 16   Re-eval: 04/22/11   Subjective: Symptoms/Limitations Symptoms: "my pain comes and goes now." Pain Assessment Currently in Pain?: Yes Pain Score:   6 Pain Location: Leg Pain Orientation: Left Pain Type: Acute pain Pain Radiating Towards: from back per MD, no actual back pain per patient.    Exercise/Treatments Stretches Active Hamstring Stretch: 3 reps;30 seconds;Limitations Active Hamstring Stretch Limitations: bil Single Knee to Chest Stretch: 3 reps;30 seconds;Limitations Single Knee to Chest Stretch Limitations: bilateral Double Knee to Chest Stretch: 2 reps;30 seconds Lower Trunk Rotation: Limitations Lower Trunk Rotation Limitations: 10 reps 5" holds Prone Mid Back Stretch: 1 rep;60 seconds;Limitations Prone Mid Back Stretch Limitations: 3 pillows Quad Stretch: 2 reps;30 seconds;Limitations Quad Stretch Limitations: left in sidelying Lumbar Exercises   Stability Clam: Supine;10 reps;5 seconds Bridge: Supine;10 reps;3 seconds Bent Knee Raise: 10 reps;3 seconds Ab Set: 10 reps;5 seconds Isometric Hip Flexion: 10 reps;5 seconds;Limitations Isometric Hip Flexion Limitations: bil Straight Leg Raise: 10 reps;2 seconds;Limitations Straight Leg Raises Limitations: bil Hip Abduction: Side-lying;10 reps;2 seconds;Limitations Hip Abduction Limitations: left Heel Squeeze: 5 seconds;Limitations;10 reps Heel Squeeze Limitations: with 3 pillows under hips Functional Squats: 10 reps Machine Exercises Tread Mill: 1.2 mph 5'  Physical Therapy Assessment and Plan PT Assessment and Plan Clinical Impression Statement: Patient is no longer having pain while sleeping, seems to have increased ROM with lower trunk rotation.  She also tolerated new exercises and stretches  well.   PT Plan: Continue with current POC, focus on left leg stretngthening, back extension ROM in pain free ROM (3 pillows needed right now to be prone), no modalities- h/o cancer.      Problem List Patient Active Problem List  Diagnoses  . Breast ca  . Colon cancer  . Osteoporosis, senile  . Arthritis of back  . Left leg pain    PT - End of Session Activity Tolerance: Patient tolerated treatment well  Yasheka Fossett B. Gaelan Glennon, PT, DPT  04/02/2011, 8:47 AM

## 2011-04-04 ENCOUNTER — Ambulatory Visit (HOSPITAL_COMMUNITY)
Admission: RE | Admit: 2011-04-04 | Discharge: 2011-04-04 | Disposition: A | Discharge status: Home / Self Care | Payer: Medicare Other | Source: Ambulatory Visit | Attending: Family Medicine | Admitting: Family Medicine

## 2011-04-04 DIAGNOSIS — IMO0001 Reserved for inherently not codable concepts without codable children: Secondary | ICD-10-CM | POA: Insufficient documentation

## 2011-04-04 DIAGNOSIS — M545 Low back pain, unspecified: Secondary | ICD-10-CM | POA: Insufficient documentation

## 2011-04-04 DIAGNOSIS — M79605 Pain in left leg: Secondary | ICD-10-CM

## 2011-04-04 DIAGNOSIS — M6281 Muscle weakness (generalized): Secondary | ICD-10-CM | POA: Insufficient documentation

## 2011-04-04 DIAGNOSIS — M25569 Pain in unspecified knee: Secondary | ICD-10-CM | POA: Diagnosis not present

## 2011-04-04 NOTE — Progress Notes (Signed)
Physical Therapy Treatment Patient Details  Name: Patricia Kline MRN: [id number] Date of Birth: 10/19/1940  Today's Date: 04/04/2011 Time: 4098-1191 Time Calculation (min): 50 min Visit#: 5  of 16   Re-eval: 04/22/11  Charge: therex 44 min  Subjective: Symptoms/Limitations Symptoms: My left knee has been killing me since last night, back is feeling ok though.  Pain scale 6/10. Pain Assessment Currently in Pain?: Yes Pain Score:   6 Pain Location: Knee Pain Orientation: Left  Precautions/Restrictions  Precautions Precaution Comments: no modalities for pain history of cancer  Exercise/Treatments Stretches Lower Trunk Rotation: Limitations Lower Trunk Rotation Limitations: 10 reps 5" holds Prone Mid Back Stretch: 1 rep;60 seconds;Limitations Prone Mid Back Stretch Limitations: 3 pillows Quad Stretch: 2 reps;30 seconds;Limitations Quad Stretch Limitations: prone with rope Stability Clam: Side-lying;10 reps;3 seconds Bridge: Supine;15 reps;3 seconds Ab Set: 10 reps;5 seconds Isometric Hip Flexion: 10 reps;5 seconds Straight Leg Raise: Prone;10 reps;2 seconds;Limitations Straight Leg Raises Limitations: BLE with 3 pillow under hip Hip Abduction: Side-lying;15 reps;2 seconds Heel Squeeze: 10 reps;5 seconds;Limitations;Prone Heel Squeeze Limitations: with 3 pillows under hips Functional Squats: Limitations;3 seconds Functional Squats Limitations: 12 reps Heel Raises: Limitations Heel Raises Limitations: 12 reps Machine Exercises Tread Mill: 6' @ 1.3     Physical Therapy Assessment and Plan PT Assessment and Plan Clinical Impression Statement: Progressed clam to sidelying with min cueing for stability.  Trunk rotation with noted increase ROM within pain tolerance R> L. PT Plan: Continue with current POC with focus on lumbar and LE strengthening, and trunk ROM within pain free range.  No modalities- history of cancer.    Goals    Problem List Patient Active Problem List   Diagnoses  . Breast ca  . Colon cancer  . Osteoporosis, senile  . Arthritis of back  . Left leg pain    PT - End of Session Activity Tolerance: Patient tolerated treatment well General Behavior During Session: [facility] for tasks performed Cognition: [facility] for tasks performed  Juel Burrow 04/04/2011, 10:34 AM

## 2011-04-05 ENCOUNTER — Ambulatory Visit (HOSPITAL_COMMUNITY)
Admission: RE | Admit: 2011-04-05 | Discharge: 2011-04-05 | Disposition: A | Discharge status: Home / Self Care | Payer: Medicare Other | Source: Ambulatory Visit | Attending: Family Medicine | Admitting: Family Medicine

## 2011-04-05 DIAGNOSIS — M25569 Pain in unspecified knee: Secondary | ICD-10-CM | POA: Diagnosis not present

## 2011-04-05 DIAGNOSIS — IMO0001 Reserved for inherently not codable concepts without codable children: Secondary | ICD-10-CM | POA: Diagnosis not present

## 2011-04-05 DIAGNOSIS — M545 Low back pain: Secondary | ICD-10-CM | POA: Diagnosis not present

## 2011-04-05 DIAGNOSIS — M6281 Muscle weakness (generalized): Secondary | ICD-10-CM | POA: Diagnosis not present

## 2011-04-05 NOTE — Progress Notes (Signed)
Physical Therapy Treatment Patient Details  Name: Patricia Kline MRN: [id number] Date of Birth: Oct 29, 1940  Today's Date: 04/05/2011 Time: 5621-3086 Time Calculation (min): 45 min Visit#: 6  of 16   Re-eval: 04/22/11 Charges: Therex x 38'  Subjective: Symptoms/Limitations Symptoms: My pain is not too bad today. Pain Assessment Currently in Pain?: Yes Pain Score:   5 Pain Location: Knee Pain Orientation: Left   Exercise/Treatments Stretches Lower Trunk Rotation: Limitations Lower Trunk Rotation Limitations: 10 reps 5" holds Quad Stretch: 2 reps;30 seconds;Limitations Quad Stretch Limitations: prone with rope Stability Clam: Side-lying;10 reps;3 seconds Bridge: Supine;15 reps;3 seconds Ab Set: 10 reps;5 seconds Isometric Hip Flexion: 10 reps;5 seconds Straight Leg Raise: Prone;10 reps;2 seconds;Limitations Straight Leg Raises Limitations: BLE with 3 pillow under hip Functional Squats: 15 reps Heel Raises: 15 reps Machine Exercises Tread Mill: 6' @ 1.3  Physical Therapy Assessment and Plan PT Assessment and Plan Clinical Impression Statement: Pt requires multimodal cueing for proper form with functional squats. Pt also requires multimodal cueing to stabilize hips with SL clams. No increases in mat exercises secondary to soreness after last session which is present today. PT Plan: Continue to prgress as pain/soreness allows. No modalities- h/o cancer.     Problem List Patient Active Problem List  Diagnoses  . Breast ca  . Colon cancer  . Osteoporosis, senile  . Arthritis of back  . Left leg pain    PT - End of Session Activity Tolerance: Patient tolerated treatment well General Behavior During Session: [facility] for tasks performed Cognition: [facility] for tasks performed  Antonieta Iba 04/05/2011, 9:49 AM

## 2011-04-09 ENCOUNTER — Ambulatory Visit (HOSPITAL_COMMUNITY)
Admission: RE | Admit: 2011-04-09 | Discharge: 2011-04-09 | Disposition: A | Discharge status: Home / Self Care | Payer: Medicare Other | Source: Ambulatory Visit | Attending: Family Medicine | Admitting: Family Medicine

## 2011-04-09 DIAGNOSIS — M545 Low back pain: Secondary | ICD-10-CM | POA: Diagnosis not present

## 2011-04-09 DIAGNOSIS — IMO0001 Reserved for inherently not codable concepts without codable children: Secondary | ICD-10-CM | POA: Diagnosis not present

## 2011-04-09 DIAGNOSIS — M25569 Pain in unspecified knee: Secondary | ICD-10-CM | POA: Diagnosis not present

## 2011-04-09 DIAGNOSIS — M6281 Muscle weakness (generalized): Secondary | ICD-10-CM | POA: Diagnosis not present

## 2011-04-09 NOTE — Progress Notes (Signed)
Physical Therapy Treatment Patient Details  Name: Patricia Kline MRN: [id number] Date of Birth: [birth year]  Today's Date: 04/09/2011 Time: 5284-1324 Time Calculation (min): 47 min Visit#: 7  of 16   Re-eval: 04/20/11 Charges:  therex 45'    Subjective: Symptoms/Limitations Symptoms: Pt. states the pain is there in her anterior L thigh. 7/10 that worsens as the day goes on. Pain Assessment Currently in Pain?: Yes Pain Score:   7 Pain Location: Leg Pain Orientation: Left (anterior thigh)  Precautions/Restrictions :  History of Cancer    Exercise/Treatments Stretches Active Hamstring Stretch: 3 reps;30 seconds;Limitations Active Hamstring Stretch Limitations: bil Lower Trunk Rotation: Limitations Lower Trunk Rotation Limitations: 10 reps 5" holds Quad Stretch: 2 reps;30 seconds;Limitations Quad Stretch Limitations: prone with rope Stability Clam: Side-lying;15 reps;3 seconds Bridge: 15 reps Ab Set: 15 reps;5 seconds Isometric Hip Flexion: 15 reps;5 seconds Straight Leg Raise: Prone;10 reps;2 seconds;Limitations Straight Leg Raises Limitations: BLE with 2 pillow under hip Heel Squeeze: 10 reps;5 seconds;Limitations;Prone Heel Squeeze Limitations: with 2 pillows under hips Machine Exercises Tread Mill: 6' @ 1.3    Physical Therapy Assessment and Plan PT Assessment and Plan Clinical Impression Statement: Continued need for cues for stability with therex.  Pt. reported pain remained the same at end of session; discussed it takes time (4-6 wks) to increase strength/flexibility.  Pt. reports comprehension/compliance with HEP.   Plan:  Continue per POC; increase stability.  Problem List Patient Active Problem List  Diagnoses  . Breast ca  . Colon cancer  . Osteoporosis, senile  . Arthritis of back  . Left leg pain    PT - End of Session Activity Tolerance: Patient tolerated treatment well General Behavior During Session: [facility] for tasks performed Cognition: [facility] for  tasks performed PT Plan of Care PT Home Exercise Plan: Continue X 5 more visits then re-evaluate.  Milfred Krammes B. Bascom Levels, PTA 04/09/2011, 9:39 AM

## 2011-04-11 ENCOUNTER — Ambulatory Visit (HOSPITAL_COMMUNITY)
Admission: RE | Admit: 2011-04-11 | Discharge: 2011-04-11 | Disposition: A | Discharge status: Home / Self Care | Payer: Medicare Other | Source: Ambulatory Visit | Attending: Family Medicine | Admitting: Family Medicine

## 2011-04-11 DIAGNOSIS — M6281 Muscle weakness (generalized): Secondary | ICD-10-CM | POA: Diagnosis not present

## 2011-04-11 DIAGNOSIS — M545 Low back pain: Secondary | ICD-10-CM | POA: Diagnosis not present

## 2011-04-11 DIAGNOSIS — M25569 Pain in unspecified knee: Secondary | ICD-10-CM | POA: Diagnosis not present

## 2011-04-11 DIAGNOSIS — IMO0001 Reserved for inherently not codable concepts without codable children: Secondary | ICD-10-CM | POA: Diagnosis not present

## 2011-04-11 NOTE — Progress Notes (Signed)
Physical Therapy Treatment Patient Details  Name: AARION KITTRELL MRN: [id number] Date of Birth: 1940/07/04  Today's Date: 04/11/2011 Time: 4098-1191 Time Calculation (min): 44 min Visit#: 8  of 16   Re-eval: 04/20/11 Charges:  therex 41'    Subjective: Symptoms/Limitations Symptoms: Pt. states she's been great since last visit.  No pain! Pain Assessment Currently in Pain?: No/denies  Precautions/Restrictions  Precaution: no modalities for pain history of cancer  Exercise/Treatments Stretches Active Hamstring Stretch: 3 reps;30 seconds;Limitations Single Knee to Chest Stretch: 3 reps;30 seconds;Limitations Single Knee to Chest Stretch Limitations: bilateral Lower Trunk Rotation: Limitations Lower Trunk Rotation Limitations: 10 reps 5" holds Stability Clam: Side-lying;15 reps;3 seconds Bridge: 20 reps Ab Set: 20 reps Isometric Hip Flexion: 15 reps Hip Abduction: Side-lying;15 reps;2 seconds Functional Squats: 15 reps Heel Raises: 15 reps Machine Exercises Tread Mill: 6' @ 1.3     Physical Therapy Assessment and Plan PT Assessment and Plan Clinical Impression Statement: Unable to complete prone exercises due to time/improper mat; Pt. without pain today, reports feeling stronger.  Decreased need for cues with therex with improved stability noted.   Plan:  Resume prone exercises next session.  Continue X 1 more week and re-evaluate progress.   Problem List Patient Active Problem List  Diagnoses  . Breast ca  . Colon cancer  . Osteoporosis, senile  . Arthritis of back  . Left leg pain    PT - End of Session Activity Tolerance: Patient tolerated treatment well General Behavior During Session: [facility] for tasks performed Cognition: [facility] for tasks performed  Bill Mcvey B. Bascom Levels, PTA 04/11/2011, 9:19 AM

## 2011-04-13 ENCOUNTER — Ambulatory Visit (HOSPITAL_COMMUNITY)
Admission: RE | Admit: 2011-04-13 | Discharge: 2011-04-13 | Disposition: A | Discharge status: Home / Self Care | Payer: Medicare Other | Source: Ambulatory Visit | Attending: Family Medicine | Admitting: Family Medicine

## 2011-04-13 DIAGNOSIS — M79605 Pain in left leg: Secondary | ICD-10-CM

## 2011-04-13 DIAGNOSIS — M25569 Pain in unspecified knee: Secondary | ICD-10-CM | POA: Diagnosis not present

## 2011-04-13 DIAGNOSIS — M6281 Muscle weakness (generalized): Secondary | ICD-10-CM | POA: Diagnosis not present

## 2011-04-13 DIAGNOSIS — M545 Low back pain: Secondary | ICD-10-CM | POA: Diagnosis not present

## 2011-04-13 DIAGNOSIS — IMO0001 Reserved for inherently not codable concepts without codable children: Secondary | ICD-10-CM | POA: Diagnosis not present

## 2011-04-13 NOTE — Progress Notes (Signed)
Physical Therapy Treatment Patient Details  Name: Patricia Kline MRN: [id number] Date of Birth: [birth year]  Today's Date: 04/13/2011 Time: 4098-1191 Time Calculation (min): 62 min Visit#: 9  of 16   Re-eval: 04/20/11  Charge: therex 54 min  Subjective: Symptoms/Limitations Symptoms: Little pain in left knee back is feeling ok, been doing the exercises at home pain scale 6/10. Pain Assessment Pain Score:   6 Pain Location: Knee Pain Orientation: Left  Precautions/Restrictions  Precautions Precaution Comments: no modalities for pain history of cancer  Exercise/Treatments Stretches Active Hamstring Stretch: 3 reps;30 seconds;Limitations Active Hamstring Stretch Limitations: bilateral with rope Prone on Elbows Stretch: Limitations Prone on Elbows Stretch Limitations: 2 minutes Quad Stretch: 3 reps;30 seconds Quad Stretch Limitations: prone with rope Stability Clam: Side-lying;15 reps;3 seconds Bridge: 20 reps Bent Knee Raise: 15 reps Ab Set: Supine;15 reps;Limitations AB Set Limitations: with heel slides Hip Abduction: Side-lying;15 reps;2 seconds Heel Squeeze: Prone;10 reps;5 seconds Heel Squeeze Limitations: with 2 pillows under hips Leg Raise: Prone;20 reps Functional Squats: 20 reps Heel Raises: 20 reps Machine Exercises Tread Mill: 6' @ 1.5     Physical Therapy Assessment and Plan PT Assessment and Plan Clinical Impression Statement: Added prone on elbow to increase lumbar extension, pt able to complete with no c/o.  Pt tolerated well towards increased activity with cueing for stability.  Pt did c/o cramps on L quad, stated slight relief following quad stretch.  Knee pain reduced at end of session. PT Plan: Continue to progress current POC.  No modalities- h/o cancer.    Goals    Problem List Patient Active Problem List  Diagnoses  . Breast ca  . Colon cancer  . Osteoporosis, senile  . Arthritis of back  . Left leg pain    PT - End of Session Activity  Tolerance: Patient tolerated treatment well General Behavior During Session: [facility] for tasks performed Cognition: [facility] for tasks performed PT Plan of Care PT Home Exercise Plan: Pt given prone on elbow HEP printout.  Juel Burrow 04/13/2011, 9:59 AM

## 2011-04-16 ENCOUNTER — Ambulatory Visit (HOSPITAL_COMMUNITY)
Admission: RE | Admit: 2011-04-16 | Discharge: 2011-04-16 | Disposition: A | Discharge status: Home / Self Care | Payer: Medicare Other | Source: Ambulatory Visit | Attending: Family Medicine | Admitting: Family Medicine

## 2011-04-16 DIAGNOSIS — M6281 Muscle weakness (generalized): Secondary | ICD-10-CM | POA: Diagnosis not present

## 2011-04-16 DIAGNOSIS — IMO0001 Reserved for inherently not codable concepts without codable children: Secondary | ICD-10-CM | POA: Diagnosis not present

## 2011-04-16 DIAGNOSIS — M545 Low back pain: Secondary | ICD-10-CM | POA: Diagnosis not present

## 2011-04-16 DIAGNOSIS — M25569 Pain in unspecified knee: Secondary | ICD-10-CM | POA: Diagnosis not present

## 2011-04-16 NOTE — Progress Notes (Signed)
Physical Therapy Treatment Patient Details  Name: Patricia Kline MRN: [id number] Date of Birth: Apr 01, 1941  Today's Date: 04/16/2011 Time: 7829-5621 Time Calculation (min): 37 min Visit#: 10  of 16   Re-eval: 04/20/11 Charges:  therex 35'    Subjective: Symptoms/Limitations Symptoms: Pt. reports she's had some good days, but hurt last night after standing in line for an hour at the funeral home.  States it also hurt more after sitting 1 hour for church.  Currently 5/10. Pain Assessment Currently in Pain?: Yes Pain Score:   5 Pain Location: Leg Pain Orientation: Left  Precautions/Restrictions  Precautions Precaution Comments: no modalities for pain history of cancer  Exercise/Treatments Stretches Active Hamstring Stretch Limitations: bilateral with rope Passive Hamstring Stretch: 3 reps;30 seconds Lower Trunk Rotation: Limitations Lower Trunk Rotation Limitations: 10 reps 5" holds Stability Clam: Side-lying;15 reps;3 seconds Bridge: 20 reps Bent Knee Raise: 15 reps Ab Set: Supine;15 reps;Limitations AB Set Limitations: with heel slides Isometric Hip Flexion: 15 reps Hip Abduction: Side-lying;15 reps;2 seconds Functional Squats: 20 reps Heel Raises: 20 reps Machine Exercises Tread Mill: 6' @ 1.5 (VC's for heel-toe gait and posture)    Physical Therapy Assessment and Plan PT Assessment and Plan Clinical Impression Statement: Pt. requires frequent VC's for heel-toe gait and posture with ambulation on treadmill.  Pt. reported overall reduction in pain after session.  Pt. has two visits remaining. PT Plan: Continue X 2 more visits; Re-eval end of week.     Problem List Patient Active Problem List  Diagnoses  . Breast ca  . Colon cancer  . Osteoporosis, senile  . Arthritis of back  . Left leg pain    PT - End of Session Activity Tolerance: Patient tolerated treatment well General Behavior During Session: [facility] for tasks performed Cognition: [facility] for tasks  performed  Amy B. Bascom Levels, PTA 04/16/2011, 9:36 AM

## 2011-04-18 ENCOUNTER — Ambulatory Visit (HOSPITAL_COMMUNITY)
Admission: RE | Admit: 2011-04-18 | Discharge: 2011-04-18 | Disposition: A | Discharge status: Home / Self Care | Payer: Medicare Other | Source: Ambulatory Visit | Attending: Family Medicine | Admitting: Family Medicine

## 2011-04-18 DIAGNOSIS — M545 Low back pain: Secondary | ICD-10-CM | POA: Diagnosis not present

## 2011-04-18 DIAGNOSIS — M6281 Muscle weakness (generalized): Secondary | ICD-10-CM | POA: Diagnosis not present

## 2011-04-18 DIAGNOSIS — M25569 Pain in unspecified knee: Secondary | ICD-10-CM | POA: Diagnosis not present

## 2011-04-18 DIAGNOSIS — IMO0001 Reserved for inherently not codable concepts without codable children: Secondary | ICD-10-CM | POA: Diagnosis not present

## 2011-04-18 DIAGNOSIS — M79605 Pain in left leg: Secondary | ICD-10-CM

## 2011-04-18 NOTE — Progress Notes (Addendum)
Physical Therapy Treatment Patient Details  Name: Patricia Kline MRN: [id number] Date of Birth: 08-12-1940  Today's Date: 04/18/2011 Time: 4098-1191 Time Calculation (min): 48 min Visit#: 11  of 16   Re-eval: 04/20/11  Charge: therex 38 min  Subjective: Symptoms/Limitations Symptoms: Pt stated she felt so good yesterday, stood up all morning doing stuff around the house then sat all afternoon doing taxes, thigh to knee with increased pain his morning, pain scale 8/10.  Nicholes Rough, PTT started pt on treadmill and standing activities first 10 min Pain Assessment Currently in Pain?: Yes Pain Score:   8 Pain Location: Leg Pain Orientation: Left  Objective:   Exercise/Treatments Stretches Active Hamstring Stretch: 3 reps;30 seconds;Limitations Active Hamstring Stretch Limitations: bilateral with rope Lower Trunk Rotation: 20 seconds;Limitations Lower Trunk Rotation Limitations: 15 reps Quad Stretch: 3 reps;30 seconds Quad Stretch Limitations: prone with rope Stability Clam: Side-lying;15 reps;3 seconds Bridge: 20 reps Ab Set: Supine;15 reps;Limitations AB Set Limitations: with heel slides Isometric Hip Flexion: 15 reps;5 seconds Hip Abduction: Side-lying;15 reps;2 seconds;Weights Hip Abduction Limitations: adduction 15 reps Hip Abduction Weights (lbs): 2 Functional Squats: 20 reps;3 seconds Heel Raises: 20 reps;Limitations Heel Raises Limitations: toe raises Machine Exercises Tread Mill: 6' @ 1.5 with cueing for increased stride length and heel to toe gait  Stretches Active Hamstring Stretch: 3 reps;30 seconds;Limitations Active Hamstring Stretch Limitations: bilateral with rope Quad Stretch: 3 reps;30 seconds Quad Stretch Limitations: prone with rope Aerobic Tread Mill: 6' @ 1.5 with cueing for increased stride length and heel to toe gait Standing Heel Raises: 20 reps;Limitations Heel Raises Limitations: toe raises 20 reps Functional Squat: 20 reps  Physical  Therapy Assessment and Plan PT Assessment and Plan Clinical Impression Statement: Cueing to increase stride length, heel to toe gait, and posture with gait on treadmill.  Pt stated pain L thigh increased following quad stretch today.  Cueing required to slow down with most supine exercises and proper form with squats.   PT Plan: Re-eval next session.    Goals    Problem List Patient Active Problem List  Diagnoses  . Breast ca  . Colon cancer  . Osteoporosis, senile  . Arthritis of back  . Left leg pain    PT - End of Session Activity Tolerance: Patient tolerated treatment well General Behavior During Session: [facility] for tasks performed Cognition: [facility] for tasks performed  Juel Burrow 04/18/2011, 9:38 AM

## 2011-04-19 ENCOUNTER — Ambulatory Visit (HOSPITAL_COMMUNITY)
Admission: RE | Admit: 2011-04-19 | Discharge: 2011-04-19 | Disposition: A | Discharge status: Home / Self Care | Payer: Medicare Other | Source: Ambulatory Visit | Attending: Family Medicine | Admitting: Family Medicine

## 2011-04-19 DIAGNOSIS — M6281 Muscle weakness (generalized): Secondary | ICD-10-CM | POA: Diagnosis not present

## 2011-04-19 DIAGNOSIS — M79605 Pain in left leg: Secondary | ICD-10-CM

## 2011-04-19 DIAGNOSIS — IMO0001 Reserved for inherently not codable concepts without codable children: Secondary | ICD-10-CM | POA: Diagnosis not present

## 2011-04-19 DIAGNOSIS — M25569 Pain in unspecified knee: Secondary | ICD-10-CM | POA: Diagnosis not present

## 2011-04-19 DIAGNOSIS — M545 Low back pain: Secondary | ICD-10-CM | POA: Diagnosis not present

## 2011-04-19 NOTE — Progress Notes (Signed)
Physical Therapy Re-Evaluation  Patient Details  Name: Patricia Kline MRN: [id number] Date of Birth: 1940/08/15  Today's Date: 04/19/2011 Time: 4098-1191 Time Calculation (min): 39 min Charges: 1 re-eval, 1 MMT, 1 ROM, 15 TE Visit#: 12  of 16   Re-eval: 04/20/11    Past Medical History:  Past Medical History  Diagnosis Date  . Breast ca 11/14/2010  . Colon cancer 11/14/2010  . Hypertension   . Osteoporosis   . Hypercholesteremia   . Arthritis   . Peripheral neuropathy   . Depression   . Mitral valve prolapse   . Osteoporosis, senile 03/05/2011  . H/O: hysterectomy   . History of right knee surgery     for bone cancer right knee  . S/P cataract surgery     bil eyes   Past Surgical History:  Past Surgical History  Procedure Date  . Cataract extraction, bilateral   . Retinal hole repair     right  . Abdominal hysterectomy   . Tonsillectomy   . Knee surgery     Subjective Symptoms/Limitations Symptoms: Not feeling good today.  Left leg is really bothering her.  Cold, damp weather today seems to be making her feel worse.   Limitations: Lifting;Standing;Walking;House hold activities How long can you sit comfortably?: no problems.   How long can you stand comfortably?: 15-60 mins depends on how her leg is feeling.   How long can you walk comfortably?: 5 mins Repetition: Increases Symptoms Pain Assessment Currently in Pain?: Yes Pain Score:   8 Pain Location: Leg Pain Orientation: Left Pain Type: Acute pain Pain Radiating Towards: thigh to knee Pain Onset: More than a month ago Pain Frequency: Constant Pain Relieving Factors: pain pill,  Effect of Pain on Daily Activities: standing up for longer periods of time causes problems. She cannot cook the way she used to for this reason Multiple Pain Sites: No  Precautions/Restrictions  H/o CA, no modalities.    Assessment RLE Assessment RLE Assessment: Within Functional Limits LLE Assessment LLE Assessment: Within  Functional Limits Lumbar AROM Lumbar Flexion: decreased 5% (no increase in pain) Lumbar Extension: Decreased 60% (increased pain in her thigh with extension.  ) Lumbar - Right Side Bend: WFL (painful) Lumbar - Left Side Bend: WFL (WFL) Lumbar - Right Rotation: WFL Lumbar - Left Rotation: [facility] Lumbar Strength Lumbar Flexion: 3+/5 Lumbar Extension: 4/5  Exercise/Treatments Stretches Active Hamstring Stretch: 30 seconds;Limitations;2 reps Active Hamstring Stretch Limitations: bilateral with rope Lower Trunk Rotation: 20 seconds;Limitations Lower Trunk Rotation Limitations: 5 reps bil Lumbar Exercises Stability Bridge: 20 reps Bent Knee Raise: Limitations Bent Knee Raise Limitations: had to stop at 11 reps secondary to increased thigh pain.   Hip Abduction: Side-lying;2 seconds;5 reps Hip Abduction Limitations: had to stop secondary to pain.   Heel Squeeze: Prone;5 seconds;15 reps (increased thigh pain as she goes along with this exercise.  ) Heel Squeeze Limitations: with 2 pillows under hips Machine Exercises Tread Mill: 6' @ 1.5   Physical Therapy Assessment and Plan  Clinical Impression Statement: The patient has been coming to physical therapy for 4 weeks now and has been very compliant with her HEP.  She has made improvements in her core strength, lumbar ROM, but her pain continues to be 5-7/10 on a weekly basis.  The therapy doesn't seem to be affecting the pain in her left leg.  She has met 1/4 STGs,  and 0/4 LTGs.  She would like to check back with Dr. Romeo Apple now to see if  he would like to do an MRI and would like her notes sent to her primary care physician and her oncologist.   PT Plan: D/C to HEP and follow up with Dr. Romeo Apple.      Goals Home Exercise Program Pt will Perform Home Exercise Program: Independently PT Goal: Perform Home Exercise Program - Progress: Met PT Short Term Goals Time to Complete Short Term Goals: 4 weeks PT Short Term Goal 1: Patient will  report reduction in daily pain to less than or equal to 5/10  PT Short Term Goal 1 - Progress: Not met PT Short Term Goal 2: Patient will report that she is able to stand at her sink to cook or clean for greater than 10 minutes without having to sit down PT Short Term Goal 2 - Progress: Partly met (sometimes) PT Short Term Goal 3: The pateint will be able to walk greater than 5 minutes without stopping due to pain PT Short Term Goal 3 - Progress: Not met PT Long Term Goals Time to Complete Long Term Goals: 8 weeks PT Long Term Goal 1: The patient will report a reduction in daily pain of less than or equal to 3/10 to improve QOL and daily acitivity tolerance PT Long Term Goal 1 - Progress: Not met PT Long Term Goal 2: The patient will increase core muscle strength to greater than or equal to 4/5 to show improved strength and spinal stability PT Long Term Goal 2 - Progress: Progressing toward goal Long Term Goal 3: The patient will increase spinal ROM to [facility] for her age to show improved spinal mobility and flexibility Long Term Goal 3 Progress: Progressing toward goal Long Term Goal 4: The patient will be able to lie flat on the bed or table without any pillows under her pelvis without discomfort to show improved lower back extension and mmobili Long Term Goal 4 Progress: Not met  Problem List Patient Active Problem List  Diagnoses  . Breast ca  . Colon cancer  . Osteoporosis, senile  . Arthritis of back  . Left leg pain    PT - End of Session Activity Tolerance: Patient limited by pain General Behavior During Session: [facility] for tasks performed Cognition: [facility] for tasks performed PT Plan of Care PT Home Exercise Plan: see printouts, no new.    Rollene Rotunda Porfirio Bollier, PT, DPT  04/19/2011, 12:24 PM  Physician Documentation Your signature is required to indicate approval of the treatment plan as stated above.  Please sign and either send electronically or make a copy of this report for  your files and return this physician signed original.   Please mark one 1.__approve of plan  2. ___approve of plan with the following conditions.   ______________________________                                                          _____________________ Physician Signature  Date  

## 2011-05-01 ENCOUNTER — Ambulatory Visit (INDEPENDENT_AMBULATORY_CARE_PROVIDER_SITE_OTHER): Payer: Medicare Other | Admitting: Orthopedic Surgery

## 2011-05-01 ENCOUNTER — Encounter: Payer: Self-pay | Admitting: Orthopedic Surgery

## 2011-05-01 DIAGNOSIS — M79609 Pain in unspecified limb: Secondary | ICD-10-CM

## 2011-05-01 DIAGNOSIS — IMO0002 Reserved for concepts with insufficient information to code with codable children: Secondary | ICD-10-CM

## 2011-05-01 DIAGNOSIS — M79605 Pain in left leg: Secondary | ICD-10-CM

## 2011-05-01 NOTE — Progress Notes (Signed)
Patient ID: Patricia Kline, female   DOB: [birth year], 71 y.o.   MRN: [id number] Visit  Treated for leg pain, thought to be secondary to sciatica with physical therapy and oral medications,.  She's made some improvement, but continues to have anterior thigh pain.  I reviewed her hip. X-rays today are normal.  I took some x-rays of her back today and that shows that she has scoliosis and degenerative disc disease with facet arthritis at L4 and L5 and S1.  Range of motion the LEFT hip. No pain or discomfort. Normal full range of motion noted in the hip. Leg lengths are equal. Ambulation is without assistive device. Neurovascular exam is otherwise, normal. She was awake, alert, and oriented x3. Mood and affect are also normal.  X-rays of the lumbar spine show degenerative disc disease at L4-L5 and S1 with scoliosis.

## 2011-05-01 NOTE — Patient Instructions (Addendum)
You will be scheduled for an MRI of your  lumbar spine.  When the MRI has been completed. The doctor will call you with further instructions regarding further treatment, which will probably include epidural steroid injections.

## 2011-05-01 NOTE — Progress Notes (Signed)
X-ray report AP, lateral, and spot films lumbosacral spine. There is abnormal, spinal alignment. There is degenerative facet arthritis at L4-L5 and S1. There are no signs of bony destruction.  Impression degenerative facet arthritis and his disease at L4-S1. X-ray taken secondary to continued leg pain on the LEFT side.

## 2011-05-02 ENCOUNTER — Encounter (HOSPITAL_COMMUNITY): Payer: Medicare Other | Attending: Oncology

## 2011-05-02 DIAGNOSIS — C189 Malignant neoplasm of colon, unspecified: Secondary | ICD-10-CM

## 2011-05-02 DIAGNOSIS — Z452 Encounter for adjustment and management of vascular access device: Secondary | ICD-10-CM | POA: Diagnosis not present

## 2011-05-02 DIAGNOSIS — Z853 Personal history of malignant neoplasm of breast: Secondary | ICD-10-CM | POA: Diagnosis not present

## 2011-05-02 DIAGNOSIS — Z85038 Personal history of other malignant neoplasm of large intestine: Secondary | ICD-10-CM

## 2011-05-02 DIAGNOSIS — C50919 Malignant neoplasm of unspecified site of unspecified female breast: Secondary | ICD-10-CM | POA: Insufficient documentation

## 2011-05-02 MED ORDER — SODIUM CHLORIDE 0.9 % IJ SOLN
10.0000 mL | INTRAMUSCULAR | Status: DC | PRN
  Administered 2011-05-02: 20 mL via INTRAVENOUS
  Filled 2011-05-02: qty 10

## 2011-05-02 MED ORDER — SODIUM CHLORIDE 0.9 % IJ SOLN
INTRAMUSCULAR | Status: AC
  Filled 2011-05-02: qty 10

## 2011-05-02 MED ORDER — HEPARIN SOD (PORK) LOCK FLUSH 100 UNIT/ML IV SOLN
500.0000 [IU] | Freq: Once | INTRAVENOUS | Status: AC
  Administered 2011-05-02: 500 [IU] via INTRAVENOUS
  Filled 2011-05-02: qty 5

## 2011-05-02 MED ORDER — HEPARIN SOD (PORK) LOCK FLUSH 100 UNIT/ML IV SOLN
INTRAVENOUS | Status: AC
  Filled 2011-05-02: qty 5

## 2011-05-02 NOTE — Progress Notes (Signed)
Patricia Kline presented for Portacath access and flush. Proper placement of portacath confirmed by CXR. Portacath located left chest wall accessed with  H 20 needle. Good blood return present. Portacath flushed with 20ml NS and 500U/5ml Heparin and needle removed intact. Procedure without incident. Patient tolerated procedure well.    

## 2011-05-07 ENCOUNTER — Telehealth: Payer: Self-pay | Admitting: Radiology

## 2011-05-07 DIAGNOSIS — Z681 Body mass index (BMI) 19 or less, adult: Secondary | ICD-10-CM | POA: Diagnosis not present

## 2011-05-07 DIAGNOSIS — M5137 Other intervertebral disc degeneration, lumbosacral region: Secondary | ICD-10-CM | POA: Diagnosis not present

## 2011-05-07 DIAGNOSIS — M545 Low back pain: Secondary | ICD-10-CM | POA: Diagnosis not present

## 2011-05-07 NOTE — Telephone Encounter (Signed)
Patient has an MRI appointment at [facility] on 05-14-11 at 12:45. Patient has Medicare, no precert is needed.

## 2011-05-14 ENCOUNTER — Ambulatory Visit
Admission: RE | Admit: 2011-05-14 | Discharge: 2011-05-14 | Disposition: A | Discharge status: Home / Self Care | Payer: Medicare Other | Source: Ambulatory Visit | Attending: Orthopedic Surgery | Admitting: Orthopedic Surgery

## 2011-05-14 DIAGNOSIS — M47817 Spondylosis without myelopathy or radiculopathy, lumbosacral region: Secondary | ICD-10-CM | POA: Diagnosis not present

## 2011-05-14 DIAGNOSIS — IMO0002 Reserved for concepts with insufficient information to code with codable children: Secondary | ICD-10-CM

## 2011-05-14 DIAGNOSIS — M79605 Pain in left leg: Secondary | ICD-10-CM

## 2011-05-14 DIAGNOSIS — M5137 Other intervertebral disc degeneration, lumbosacral region: Secondary | ICD-10-CM | POA: Diagnosis not present

## 2011-05-14 DIAGNOSIS — M5126 Other intervertebral disc displacement, lumbar region: Secondary | ICD-10-CM | POA: Diagnosis not present

## 2011-05-22 ENCOUNTER — Ambulatory Visit (INDEPENDENT_AMBULATORY_CARE_PROVIDER_SITE_OTHER): Payer: Medicare Other | Admitting: Orthopedic Surgery

## 2011-05-22 ENCOUNTER — Encounter: Payer: Self-pay | Admitting: Orthopedic Surgery

## 2011-05-22 VITALS — BP 140/60 | Ht 64.0 in | Wt 114.0 lb

## 2011-05-22 DIAGNOSIS — M48061 Spinal stenosis, lumbar region without neurogenic claudication: Secondary | ICD-10-CM

## 2011-05-22 NOTE — Progress Notes (Signed)
Patient ID: Patricia Kline, female   DOB: July 11, 1940, 71 y.o.   MRN: [id number] Chief Complaint  Patient presents with  . Follow-up    MRI results.    History of LEFT lower extremity radicular pain  Patient says she has been feeling better.  She denies any red flags signs  Her MRI shows spinal stenosis.  The impression given was that she has L4-L5 predominantly mild lumbar spondylosis with broad-based disc protrusion with bilateral lateral recess encroachment potentially affecting both descending L5 nerves with mild central stenosis.  There is bilateral facet arthrosis moderate.  Symmetric bilateral foraminal stenosis with facet hypertrophy potentially affecting the L4 nerves.  She also has L3-L4 disc desiccation with shallow broad based posterior disc bulge and mild symmetric bilateral foraminal stenosis with LEFT lateral protrusion contacting the exiting LEFT L3 nerve root lateral to the foramen.  Most notably there is no mass to suggest any bone tumor as the patient has history of cancer in the breast and colon  Discussed these findings with the patient with a spine model.  She is going to think about L4-L5 disc epidural injection.  Code based on time spent > 15 minutes

## 2011-05-22 NOTE — Patient Instructions (Signed)
Call us if you want to have injections

## 2011-06-05 ENCOUNTER — Other Ambulatory Visit: Payer: Self-pay | Admitting: *Deleted

## 2011-06-05 DIAGNOSIS — M48061 Spinal stenosis, lumbar region without neurogenic claudication: Secondary | ICD-10-CM

## 2011-06-06 DIAGNOSIS — D235 Other benign neoplasm of skin of trunk: Secondary | ICD-10-CM | POA: Diagnosis not present

## 2011-06-07 DIAGNOSIS — M48062 Spinal stenosis, lumbar region with neurogenic claudication: Secondary | ICD-10-CM | POA: Diagnosis not present

## 2011-06-07 DIAGNOSIS — IMO0002 Reserved for concepts with insufficient information to code with codable children: Secondary | ICD-10-CM | POA: Diagnosis not present

## 2011-06-07 DIAGNOSIS — M5126 Other intervertebral disc displacement, lumbar region: Secondary | ICD-10-CM | POA: Diagnosis not present

## 2011-06-07 DIAGNOSIS — M545 Low back pain: Secondary | ICD-10-CM | POA: Diagnosis not present

## 2011-06-13 ENCOUNTER — Encounter (HOSPITAL_COMMUNITY): Payer: Medicare Other | Attending: Oncology

## 2011-06-13 DIAGNOSIS — C189 Malignant neoplasm of colon, unspecified: Secondary | ICD-10-CM | POA: Diagnosis not present

## 2011-06-13 DIAGNOSIS — Z85038 Personal history of other malignant neoplasm of large intestine: Secondary | ICD-10-CM

## 2011-06-13 DIAGNOSIS — C50919 Malignant neoplasm of unspecified site of unspecified female breast: Secondary | ICD-10-CM | POA: Insufficient documentation

## 2011-06-13 MED ORDER — SODIUM CHLORIDE 0.9 % IJ SOLN
INTRAMUSCULAR | Status: AC
  Administered 2011-06-13: 10 mL via INTRAVENOUS
  Filled 2011-06-13: qty 10

## 2011-06-13 MED ORDER — SODIUM CHLORIDE 0.9 % IJ SOLN
10.0000 mL | INTRAMUSCULAR | Status: DC | PRN
  Administered 2011-06-13: 10 mL via INTRAVENOUS
  Filled 2011-06-13: qty 10

## 2011-06-13 MED ORDER — HEPARIN SOD (PORK) LOCK FLUSH 100 UNIT/ML IV SOLN
500.0000 [IU] | Freq: Once | INTRAVENOUS | Status: AC
  Administered 2011-06-13: 500 [IU] via INTRAVENOUS
  Filled 2011-06-13: qty 5

## 2011-06-13 MED ORDER — HEPARIN SOD (PORK) LOCK FLUSH 100 UNIT/ML IV SOLN
INTRAVENOUS | Status: AC
  Administered 2011-06-13: 500 [IU] via INTRAVENOUS
  Filled 2011-06-13: qty 5

## 2011-06-13 NOTE — Progress Notes (Signed)
Patricia Kline presented for Portacath access and flush. Proper placement of portacath confirmed by CXR. Portacath located rt chest wall accessed with  H 20 needle. Good blood return present. Portacath flushed with 20ml NS and 500U/75ml Heparin and needle removed intact. Procedure without incident. Patient tolerated procedure well.

## 2011-06-15 ENCOUNTER — Telehealth (HOSPITAL_COMMUNITY): Payer: Self-pay

## 2011-06-15 NOTE — Telephone Encounter (Signed)
Results of CEA and Ca 27-29 called to patient @ her request.

## 2011-06-27 DIAGNOSIS — M48061 Spinal stenosis, lumbar region without neurogenic claudication: Secondary | ICD-10-CM | POA: Diagnosis not present

## 2011-06-27 DIAGNOSIS — M545 Low back pain: Secondary | ICD-10-CM | POA: Diagnosis not present

## 2011-06-27 DIAGNOSIS — M5137 Other intervertebral disc degeneration, lumbosacral region: Secondary | ICD-10-CM | POA: Diagnosis not present

## 2011-06-27 DIAGNOSIS — G894 Chronic pain syndrome: Secondary | ICD-10-CM | POA: Diagnosis not present

## 2011-06-28 DIAGNOSIS — IMO0002 Reserved for concepts with insufficient information to code with codable children: Secondary | ICD-10-CM | POA: Diagnosis not present

## 2011-06-28 DIAGNOSIS — G894 Chronic pain syndrome: Secondary | ICD-10-CM | POA: Diagnosis not present

## 2011-06-28 DIAGNOSIS — M545 Low back pain: Secondary | ICD-10-CM | POA: Diagnosis not present

## 2011-06-28 DIAGNOSIS — M5137 Other intervertebral disc degeneration, lumbosacral region: Secondary | ICD-10-CM | POA: Diagnosis not present

## 2011-06-28 DIAGNOSIS — M48062 Spinal stenosis, lumbar region with neurogenic claudication: Secondary | ICD-10-CM | POA: Diagnosis not present

## 2011-07-10 ENCOUNTER — Encounter (HOSPITAL_COMMUNITY): Payer: Medicare Other | Attending: Oncology | Admitting: Oncology

## 2011-07-10 ENCOUNTER — Encounter (HOSPITAL_COMMUNITY): Payer: Self-pay | Admitting: Oncology

## 2011-07-10 VITALS — BP 127/70 | HR 67 | Temp 98.5°F | Wt 109.0 lb

## 2011-07-10 DIAGNOSIS — C50919 Malignant neoplasm of unspecified site of unspecified female breast: Secondary | ICD-10-CM | POA: Diagnosis not present

## 2011-07-10 DIAGNOSIS — M810 Age-related osteoporosis without current pathological fracture: Secondary | ICD-10-CM

## 2011-07-10 DIAGNOSIS — Z85038 Personal history of other malignant neoplasm of large intestine: Secondary | ICD-10-CM

## 2011-07-10 DIAGNOSIS — G629 Polyneuropathy, unspecified: Secondary | ICD-10-CM

## 2011-07-10 DIAGNOSIS — Z853 Personal history of malignant neoplasm of breast: Secondary | ICD-10-CM

## 2011-07-10 DIAGNOSIS — M6281 Muscle weakness (generalized): Secondary | ICD-10-CM | POA: Diagnosis not present

## 2011-07-10 DIAGNOSIS — C189 Malignant neoplasm of colon, unspecified: Secondary | ICD-10-CM

## 2011-07-10 LAB — CBC
HCT: 36.1 % (ref 36.0–46.0)
MCHC: 33.8 g/dL (ref 30.0–36.0)
Platelets: 184 10*3/uL (ref 150–400)
RDW: 13.1 % (ref 11.5–15.5)

## 2011-07-10 LAB — CEA: CEA: <0.5 ng/mL (ref 0.0–5.0)

## 2011-07-10 LAB — COMPREHENSIVE METABOLIC PANEL
ALT: 19 U/L (ref 0–35)
AST: 16 U/L (ref 0–37)
Albumin: 4 g/dL (ref 3.5–5.2)
Calcium: 9.3 mg/dL (ref 8.4–10.5)
Chloride: 96 mEq/L (ref 96–112)
Creatinine, Ser: 1.37 mg/dL — ABNORMAL HIGH (ref 0.50–1.10)
Sodium: 133 mEq/L — ABNORMAL LOW (ref 135–145)
Total Bilirubin: 0.4 mg/dL (ref 0.3–1.2)

## 2011-07-10 LAB — DIFFERENTIAL
Basophils Absolute: 0 10*3/uL (ref 0.0–0.1)
Basophils Relative: 0 % (ref 0–1)
Monocytes Absolute: 0.9 10*3/uL (ref 0.1–1.0)
Neutro Abs: 8.8 10*3/uL — ABNORMAL HIGH (ref 1.7–7.7)

## 2011-07-10 MED ORDER — GABAPENTIN 300 MG PO CAPS: 300.0000 mg | ORAL_CAPSULE | Freq: Four times a day (QID) | ORAL | Status: DC

## 2011-07-10 NOTE — Patient Instructions (Signed)
Patricia Kline  [id number] Nov 10, 1940   [facility]  Discharge Instructions  RECOMMENDATIONS MADE BY THE CONSULTANT AND ANY TEST RESULTS WILL BE SENT TO YOUR REFERRING DOCTOR.   EXAM FINDINGS BY MD TODAY AND SIGNS AND SYMPTOMS TO REPORT TO CLINIC OR PRIMARY MD: MD wants to check some blood work today.  MEDICATIONS PRESCRIBED: Refills for gabapentin Follow label directions  INSTRUCTIONS GIVEN AND DISCUSSED: Other :  Report any new lumps, bone pain, shortness of breath, changes in bowel habits, blood in stool, etc.  SPECIAL INSTRUCTIONS/FOLLOW-UP: Return to Clinic in 6 months to see PA for follow-up, port flushes and prolia as scheduled.   I acknowledge that I have been informed and understand all the instructions given to me and received a copy. I do not have any more questions at this time, but understand that I may call the Specialty Clinic at [facility] at [phone] during business hours should I have any further questions or need assistance in obtaining follow-up care.    __________________________________________  _____________  __________ Signature of Patient or Authorized Representative            Date                   Time    __________________________________________ Nurse's Signature

## 2011-07-10 NOTE — Progress Notes (Signed)
CC:   Corrie Mckusick, M.D. Dalia Heading, M.D. Maryln Gottron, M.D. Jonathon Bellows, MD FACP [facility] Alanda Amass, M.D.  DIAGNOSIS: 1. Stage III adenocarcinoma of the sigmoid colon with resection on     05/29/2004 for a 6.0 cm primary with 6 of 15 positive nodes, and     she took adjuvant oxaliplatin and capecitabine for 6 cycles thus     far without recurrence with a negative colonoscopy in May 2010, and     Dr. Jena Gauss plans another one in May 2015. 2. 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  The tumor was 2.0 cm pathologically, status post     lumpectomy and sentinel node biopsy, followed by radiation therapy     and then we treated with Arimidex adjuvantly for 5 years.  She     develop the inability tolerate Arimidex and we placed her on     tamoxifen which she finished her 5 full years of hormonal therapy.     Femara at that time was too costly for her.  She had stage II     disease at presentation and by MRI. 3. Shortness of breath with a rapid heart rate during the summer while     cutting grass and she has been referred back to Dr. Alanda Amass and I     do not have any recent notes from him but I know that she has seen     him. 4. Degenerative disk and joint disease with spinal stenosis of the low     back with weakness of both legs and pain in the left leg in     particular for which she has seen Dr. Eduard Clos who has given her 2     epidural steroid injections with relief of the pain but not in the     weakness and she is going to see a neurologist in [place], she     does not know the name, but that will take place in the near     future.  She has had an MRI and plain films.  She has also seen Dr.     Fuller Canada, no evidence of cancer in the bones.  This is all     degenerative. 5.  Osteoporosis on Zometa, calcium, vitamin D once in December. 6. Benign breast biopsy in 2000. 7. Right retinal hole repair by Dr. Fawn Kirk in the past. 8. Hypercholesterolemia. 9. Hypertension. 10.Right-sided breast pain intermittently since her surgery. 11.Mitral valve prolapse with a click and murmur. 12.Peripheral neuropathy on gabapentin 300 mg q.i.d. since her     chemotherapy for colon cancer and that is somewhat improved. 13.Mild thrombocytopenia. 14.Depression much improved on Celexa.  Caron has more problems with her low back and weakness than I could have imagine she would tell me.  She is very unhappy she is weak.  She has fallen once or twice, needs her husband to help balance her.  Her legs seem to give way.  The workup has included quite a significant amount of x-rays including MRI which shows no cancer, just significant degenerative joint and disk disease.  Dr. Eduard Clos and a neurologist that is in [place] are going to be seeing her to work on her.  She remains very  thin but very active just she has been told to slow down, take it easy and not rush so she will not fall.  PHYSICAL EXAMINATION:  Her vital signs are stable.  Weight is 109 pounds.  Blood pressure 127/70 left arm, sitting position, pulse is 66- 68 and regular, respirations 16 and unlabored.  She looks slightly weaker to me.  Her lungs, though, are clear.  Both breasts, the right breast is slightly thickened from the radiation but no masses in either breast.  Her heart shows no obvious S3 gallop.  Click and murmur are there.  Abdomen:  Soft and nontender without hepatosplenomegaly.  She is easily overcome in the quadriceps and triceps femora.  Her psoas is also very weak.  She absolutely needs to see a neurologist.  I do not know if there is much more they can for her.  She was wondering whether the steroid injections made her weaker.  That may be a little bit of this but I think it is more what  is going on in the back.  I will see her in 6 months.  I do want to get some blood work on her to make sure we are not missing anything.    ______________________________ Ladona Horns. Mariel Sleet, MD ESN/MEDQ  D:  07/10/2011  T:  07/10/2011  Job:  [id number]

## 2011-07-10 NOTE — Progress Notes (Signed)
This office note has been dictated.

## 2011-07-18 DIAGNOSIS — M5137 Other intervertebral disc degeneration, lumbosacral region: Secondary | ICD-10-CM | POA: Diagnosis not present

## 2011-07-18 DIAGNOSIS — M48061 Spinal stenosis, lumbar region without neurogenic claudication: Secondary | ICD-10-CM | POA: Diagnosis not present

## 2011-07-18 DIAGNOSIS — M545 Low back pain: Secondary | ICD-10-CM | POA: Diagnosis not present

## 2011-07-18 DIAGNOSIS — G894 Chronic pain syndrome: Secondary | ICD-10-CM | POA: Diagnosis not present

## 2011-07-19 DIAGNOSIS — M5126 Other intervertebral disc displacement, lumbar region: Secondary | ICD-10-CM | POA: Diagnosis not present

## 2011-07-19 DIAGNOSIS — M48062 Spinal stenosis, lumbar region with neurogenic claudication: Secondary | ICD-10-CM | POA: Diagnosis not present

## 2011-07-19 DIAGNOSIS — IMO0002 Reserved for concepts with insufficient information to code with codable children: Secondary | ICD-10-CM | POA: Diagnosis not present

## 2011-07-19 DIAGNOSIS — M5137 Other intervertebral disc degeneration, lumbosacral region: Secondary | ICD-10-CM | POA: Diagnosis not present

## 2011-07-25 ENCOUNTER — Encounter (HOSPITAL_BASED_OUTPATIENT_CLINIC_OR_DEPARTMENT_OTHER): Payer: Medicare Other

## 2011-07-25 DIAGNOSIS — G63 Polyneuropathy in diseases classified elsewhere: Secondary | ICD-10-CM | POA: Diagnosis not present

## 2011-07-25 DIAGNOSIS — C189 Malignant neoplasm of colon, unspecified: Secondary | ICD-10-CM | POA: Diagnosis not present

## 2011-07-25 MED ORDER — HEPARIN SOD (PORK) LOCK FLUSH 100 UNIT/ML IV SOLN
500.0000 [IU] | Freq: Once | INTRAVENOUS | Status: AC
  Administered 2011-07-25: 500 [IU] via INTRAVENOUS
  Filled 2011-07-25: qty 5

## 2011-07-25 MED ORDER — SODIUM CHLORIDE 0.9 % IJ SOLN
10.0000 mL | INTRAMUSCULAR | Status: DC | PRN
  Administered 2011-07-25: 10 mL via INTRAVENOUS
  Filled 2011-07-25: qty 10

## 2011-07-25 MED ORDER — SODIUM CHLORIDE 0.9 % IJ SOLN
INTRAMUSCULAR | Status: AC
  Filled 2011-07-25: qty 10

## 2011-07-25 MED ORDER — HEPARIN SOD (PORK) LOCK FLUSH 100 UNIT/ML IV SOLN
INTRAVENOUS | Status: AC
  Filled 2011-07-25: qty 5

## 2011-07-25 NOTE — Progress Notes (Signed)
Patricia Kline presented for Portacath access and flush.  Proper placement of portacath confirmed by CXR.  Portacath located left chest wall accessed with  H 20 needle.  Good blood return present. Portacath flushed with 20ml NS and 500U/82ml Heparin and needle removed intact.  Procedure without incident.  Patient tolerated procedure well.  Patient also advised that, according to Dr. Thornton Papas last office visit note, she will have her labs performed annually.

## 2011-08-07 DIAGNOSIS — H35349 Macular cyst, hole, or pseudohole, unspecified eye: Secondary | ICD-10-CM | POA: Diagnosis not present

## 2011-08-07 DIAGNOSIS — Z961 Presence of intraocular lens: Secondary | ICD-10-CM | POA: Diagnosis not present

## 2011-08-07 DIAGNOSIS — H353190 Nonexudative age-related macular degeneration, unspecified eye, stage unspecified: Secondary | ICD-10-CM | POA: Diagnosis not present

## 2011-08-16 DIAGNOSIS — Z681 Body mass index (BMI) 19 or less, adult: Secondary | ICD-10-CM | POA: Diagnosis not present

## 2011-08-16 DIAGNOSIS — J069 Acute upper respiratory infection, unspecified: Secondary | ICD-10-CM | POA: Diagnosis not present

## 2011-08-16 DIAGNOSIS — B9789 Other viral agents as the cause of diseases classified elsewhere: Secondary | ICD-10-CM | POA: Diagnosis not present

## 2011-09-05 ENCOUNTER — Encounter (HOSPITAL_COMMUNITY): Payer: Medicare Other

## 2011-09-10 DIAGNOSIS — R42 Dizziness and giddiness: Secondary | ICD-10-CM | POA: Diagnosis not present

## 2011-09-10 DIAGNOSIS — R269 Unspecified abnormalities of gait and mobility: Secondary | ICD-10-CM | POA: Diagnosis not present

## 2011-09-14 ENCOUNTER — Encounter (HOSPITAL_COMMUNITY): Payer: Medicare Other | Attending: Oncology

## 2011-09-14 DIAGNOSIS — M810 Age-related osteoporosis without current pathological fracture: Secondary | ICD-10-CM | POA: Diagnosis not present

## 2011-09-14 MED ORDER — HEPARIN SOD (PORK) LOCK FLUSH 100 UNIT/ML IV SOLN
INTRAVENOUS | Status: AC
  Filled 2011-09-14: qty 5

## 2011-09-14 MED ORDER — SODIUM CHLORIDE 0.9 % IJ SOLN
INTRAMUSCULAR | Status: AC
  Filled 2011-09-14: qty 10

## 2011-09-14 MED ORDER — HEPARIN SOD (PORK) LOCK FLUSH 100 UNIT/ML IV SOLN
500.0000 [IU] | Freq: Once | INTRAVENOUS | Status: AC
  Administered 2011-09-14: 500 [IU] via INTRAVENOUS
  Filled 2011-09-14: qty 5

## 2011-09-14 MED ORDER — DENOSUMAB 60 MG/ML ~~LOC~~ SOLN
60.0000 mg | Freq: Once | SUBCUTANEOUS | Status: AC
  Administered 2011-09-14: 60 mg via SUBCUTANEOUS
  Filled 2011-09-14: qty 1

## 2011-09-14 MED ORDER — SODIUM CHLORIDE 0.9 % IJ SOLN
10.0000 mL | INTRAMUSCULAR | Status: DC | PRN
  Administered 2011-09-14: 10 mL via INTRAVENOUS
  Filled 2011-09-14: qty 10

## 2011-09-14 NOTE — Progress Notes (Signed)
Tolerated port flush and injection well. 

## 2011-09-20 ENCOUNTER — Other Ambulatory Visit (HOSPITAL_COMMUNITY): Payer: Self-pay | Admitting: Family Medicine

## 2011-09-20 DIAGNOSIS — Z09 Encounter for follow-up examination after completed treatment for conditions other than malignant neoplasm: Secondary | ICD-10-CM

## 2011-09-20 DIAGNOSIS — Z9889 Other specified postprocedural states: Secondary | ICD-10-CM

## 2011-09-20 DIAGNOSIS — Z139 Encounter for screening, unspecified: Secondary | ICD-10-CM

## 2011-10-09 DIAGNOSIS — IMO0002 Reserved for concepts with insufficient information to code with codable children: Secondary | ICD-10-CM | POA: Diagnosis not present

## 2011-10-09 DIAGNOSIS — M545 Low back pain: Secondary | ICD-10-CM | POA: Diagnosis not present

## 2011-10-09 DIAGNOSIS — M48062 Spinal stenosis, lumbar region with neurogenic claudication: Secondary | ICD-10-CM | POA: Diagnosis not present

## 2011-10-09 DIAGNOSIS — M5137 Other intervertebral disc degeneration, lumbosacral region: Secondary | ICD-10-CM | POA: Diagnosis not present

## 2011-10-11 DIAGNOSIS — H35349 Macular cyst, hole, or pseudohole, unspecified eye: Secondary | ICD-10-CM | POA: Diagnosis not present

## 2011-10-17 ENCOUNTER — Ambulatory Visit (HOSPITAL_COMMUNITY)
Admission: RE | Admit: 2011-10-17 | Discharge: 2011-10-17 | Disposition: A | Discharge status: Home / Self Care | Payer: Medicare Other | Source: Ambulatory Visit | Attending: Family Medicine | Admitting: Family Medicine

## 2011-10-17 DIAGNOSIS — Z853 Personal history of malignant neoplasm of breast: Secondary | ICD-10-CM | POA: Insufficient documentation

## 2011-10-17 DIAGNOSIS — Z9889 Other specified postprocedural states: Secondary | ICD-10-CM

## 2011-10-24 ENCOUNTER — Encounter (HOSPITAL_COMMUNITY): Payer: Medicare Other | Attending: Oncology

## 2011-10-24 DIAGNOSIS — Z95828 Presence of other vascular implants and grafts: Secondary | ICD-10-CM

## 2011-10-24 DIAGNOSIS — C189 Malignant neoplasm of colon, unspecified: Secondary | ICD-10-CM

## 2011-10-24 DIAGNOSIS — Z452 Encounter for adjustment and management of vascular access device: Secondary | ICD-10-CM | POA: Diagnosis not present

## 2011-10-24 DIAGNOSIS — Z9889 Other specified postprocedural states: Secondary | ICD-10-CM | POA: Insufficient documentation

## 2011-10-24 MED ORDER — SODIUM CHLORIDE 0.9 % IJ SOLN
INTRAMUSCULAR | Status: AC
  Filled 2011-10-24: qty 10

## 2011-10-24 MED ORDER — HEPARIN SOD (PORK) LOCK FLUSH 100 UNIT/ML IV SOLN
INTRAVENOUS | Status: AC
  Filled 2011-10-24: qty 5

## 2011-10-24 MED ORDER — SODIUM CHLORIDE 0.9 % IJ SOLN
10.0000 mL | INTRAMUSCULAR | Status: DC | PRN
  Administered 2011-10-24: 10 mL via INTRAVENOUS
  Filled 2011-10-24: qty 10

## 2011-10-24 MED ORDER — HEPARIN SOD (PORK) LOCK FLUSH 100 UNIT/ML IV SOLN
500.0000 [IU] | Freq: Once | INTRAVENOUS | Status: AC
  Administered 2011-10-24: 500 [IU] via INTRAVENOUS
  Filled 2011-10-24: qty 5

## 2011-10-24 NOTE — Progress Notes (Signed)
Patricia Kline presented for Portacath access and flush. Proper placement of portacath confirmed by CXR. Portacath located lt chest wall accessed with  H 20 needle. Good blood return present. Portacath flushed with 20ml NS and 500U/5ml Heparin and needle removed intact. Procedure without incident. Patient tolerated procedure well.   

## 2011-12-04 ENCOUNTER — Encounter (HOSPITAL_COMMUNITY): Payer: Medicare Other | Attending: Oncology

## 2011-12-04 DIAGNOSIS — C50919 Malignant neoplasm of unspecified site of unspecified female breast: Secondary | ICD-10-CM | POA: Insufficient documentation

## 2011-12-04 DIAGNOSIS — Z452 Encounter for adjustment and management of vascular access device: Secondary | ICD-10-CM

## 2011-12-04 DIAGNOSIS — C189 Malignant neoplasm of colon, unspecified: Secondary | ICD-10-CM | POA: Diagnosis not present

## 2011-12-04 LAB — CBC
HCT: 34.5 % — ABNORMAL LOW (ref 36.0–46.0)
Hemoglobin: 11.6 g/dL — ABNORMAL LOW (ref 12.0–15.0)
MCV: 94.3 fL (ref 78.0–100.0)
WBC: 4.8 10*3/uL (ref 4.0–10.5)

## 2011-12-04 LAB — DIFFERENTIAL
Lymphocytes Relative: 20 % (ref 12–46)
Lymphs Abs: 1 10*3/uL (ref 0.7–4.0)
Monocytes Absolute: 0.5 10*3/uL (ref 0.1–1.0)
Monocytes Relative: 10 % (ref 3–12)
Neutro Abs: 3.2 10*3/uL (ref 1.7–7.7)

## 2011-12-04 LAB — BASIC METABOLIC PANEL
BUN: 12 mg/dL (ref 6–23)
GFR calc non Af Amer: 64 mL/min — ABNORMAL LOW (ref >90)
Glucose, Bld: 90 mg/dL (ref 70–99)
Potassium: 3.8 mEq/L (ref 3.5–5.1)

## 2011-12-04 MED ORDER — SODIUM CHLORIDE 0.9 % IJ SOLN
INTRAMUSCULAR | Status: AC
  Filled 2011-12-04: qty 20

## 2011-12-04 MED ORDER — HEPARIN SOD (PORK) LOCK FLUSH 100 UNIT/ML IV SOLN
500.0000 [IU] | Freq: Once | INTRAVENOUS | Status: AC
  Administered 2011-12-04: 500 [IU] via INTRAVENOUS
  Filled 2011-12-04: qty 5

## 2011-12-04 MED ORDER — SODIUM CHLORIDE 0.9 % IJ SOLN
20.0000 mL | INTRAMUSCULAR | Status: DC | PRN
  Administered 2011-12-04: 20 mL via INTRAVENOUS
  Filled 2011-12-04: qty 20

## 2011-12-04 MED ORDER — HEPARIN SOD (PORK) LOCK FLUSH 100 UNIT/ML IV SOLN
INTRAVENOUS | Status: AC
  Filled 2011-12-04: qty 5

## 2011-12-04 NOTE — Addendum Note (Signed)
Addended by: Hester Mates A on: 12/04/2011 09:07 AM   Modules accepted: Orders

## 2011-12-04 NOTE — Progress Notes (Signed)
Patricia Kline presented for Portacath access and flush. Proper placement of portacath confirmed by CXR. Portacath located left chest wall accessed with  H 20 needle. Good blood return present.  Specimen drawn for labs. Portacath flushed with 20ml NS and 500U/22ml Heparin and needle removed intact. Procedure without incident. Patient tolerated procedure well.

## 2011-12-05 DIAGNOSIS — IMO0002 Reserved for concepts with insufficient information to code with codable children: Secondary | ICD-10-CM | POA: Diagnosis not present

## 2011-12-05 DIAGNOSIS — E785 Hyperlipidemia, unspecified: Secondary | ICD-10-CM | POA: Diagnosis not present

## 2011-12-05 DIAGNOSIS — Z0000 Encounter for general adult medical examination without abnormal findings: Secondary | ICD-10-CM | POA: Diagnosis not present

## 2011-12-10 DIAGNOSIS — G63 Polyneuropathy in diseases classified elsewhere: Secondary | ICD-10-CM | POA: Diagnosis not present

## 2011-12-10 DIAGNOSIS — M545 Low back pain: Secondary | ICD-10-CM | POA: Diagnosis not present

## 2011-12-10 DIAGNOSIS — M48062 Spinal stenosis, lumbar region with neurogenic claudication: Secondary | ICD-10-CM | POA: Diagnosis not present

## 2011-12-10 DIAGNOSIS — M5137 Other intervertebral disc degeneration, lumbosacral region: Secondary | ICD-10-CM | POA: Diagnosis not present

## 2011-12-10 DIAGNOSIS — G894 Chronic pain syndrome: Secondary | ICD-10-CM | POA: Diagnosis not present

## 2011-12-10 DIAGNOSIS — IMO0002 Reserved for concepts with insufficient information to code with codable children: Secondary | ICD-10-CM | POA: Diagnosis not present

## 2012-01-04 DIAGNOSIS — Z23 Encounter for immunization: Secondary | ICD-10-CM | POA: Diagnosis not present

## 2012-01-09 ENCOUNTER — Ambulatory Visit (HOSPITAL_COMMUNITY): Payer: Medicare Other | Admitting: Oncology

## 2012-01-09 ENCOUNTER — Encounter (HOSPITAL_COMMUNITY): Payer: Medicare Other

## 2012-01-11 DIAGNOSIS — E782 Mixed hyperlipidemia: Secondary | ICD-10-CM | POA: Diagnosis not present

## 2012-01-11 DIAGNOSIS — I32 Pericarditis in diseases classified elsewhere: Secondary | ICD-10-CM | POA: Diagnosis not present

## 2012-01-11 DIAGNOSIS — I10 Essential (primary) hypertension: Secondary | ICD-10-CM | POA: Diagnosis not present

## 2012-01-15 ENCOUNTER — Ambulatory Visit (HOSPITAL_COMMUNITY): Payer: Medicare Other | Admitting: Oncology

## 2012-01-17 ENCOUNTER — Encounter (HOSPITAL_COMMUNITY): Payer: Medicare Other | Attending: Oncology | Admitting: Oncology

## 2012-01-17 ENCOUNTER — Encounter (HOSPITAL_COMMUNITY): Payer: Self-pay | Admitting: Oncology

## 2012-01-17 ENCOUNTER — Encounter (HOSPITAL_COMMUNITY): Payer: Medicare Other

## 2012-01-17 VITALS — BP 117/61 | HR 51 | Temp 97.9°F | Resp 18 | Wt 113.5 lb

## 2012-01-17 DIAGNOSIS — G609 Hereditary and idiopathic neuropathy, unspecified: Secondary | ICD-10-CM | POA: Insufficient documentation

## 2012-01-17 DIAGNOSIS — M810 Age-related osteoporosis without current pathological fracture: Secondary | ICD-10-CM | POA: Diagnosis not present

## 2012-01-17 DIAGNOSIS — G629 Polyneuropathy, unspecified: Secondary | ICD-10-CM

## 2012-01-17 DIAGNOSIS — C189 Malignant neoplasm of colon, unspecified: Secondary | ICD-10-CM | POA: Insufficient documentation

## 2012-01-17 DIAGNOSIS — Z853 Personal history of malignant neoplasm of breast: Secondary | ICD-10-CM | POA: Diagnosis not present

## 2012-01-17 DIAGNOSIS — C50919 Malignant neoplasm of unspecified site of unspecified female breast: Secondary | ICD-10-CM | POA: Insufficient documentation

## 2012-01-17 DIAGNOSIS — Z85038 Personal history of other malignant neoplasm of large intestine: Secondary | ICD-10-CM | POA: Diagnosis not present

## 2012-01-17 HISTORY — DX: Polyneuropathy, unspecified: G62.9

## 2012-01-17 MED ORDER — GABAPENTIN 300 MG PO CAPS: 300.0000 mg | ORAL_CAPSULE | Freq: Four times a day (QID) | ORAL | Status: DC

## 2012-01-17 MED ORDER — HEPARIN SOD (PORK) LOCK FLUSH 100 UNIT/ML IV SOLN
500.0000 [IU] | Freq: Once | INTRAVENOUS | Status: AC
  Administered 2012-01-17: 500 [IU] via INTRAVENOUS
  Filled 2012-01-17: qty 5

## 2012-01-17 MED ORDER — HEPARIN SOD (PORK) LOCK FLUSH 100 UNIT/ML IV SOLN
INTRAVENOUS | Status: AC
  Filled 2012-01-17: qty 5

## 2012-01-17 MED ORDER — SODIUM CHLORIDE 0.9 % IJ SOLN
10.0000 mL | INTRAMUSCULAR | Status: DC | PRN
  Administered 2012-01-17: 10 mL via INTRAVENOUS
  Filled 2012-01-17: qty 10

## 2012-01-17 NOTE — Progress Notes (Signed)
Patricia Kline presented for Portacath access and flush. Proper placement of portacath confirmed by CXR. Portacath located lt chest wall accessed with  H 20 needle. Good blood return present. Portacath flushed with 20ml NS and 500U/85ml Heparin and needle removed intact. Procedure without incident. Patient tolerated procedure well.

## 2012-01-17 NOTE — Patient Instructions (Addendum)
[facility]  Discharge Instructions  RECOMMENDATIONS MADE BY THE CONSULTANT AND ANY TEST RESULTS WILL BE SENT TO YOUR REFERRING DOCTOR.   EXAM FINDINGS BY MD TODAY AND SIGNS AND SYMPTOMS TO REPORT TO CLINIC OR PRIMARY MD: exam good You may have port removed we will call Dr. Holland Falling flush today INSTRUCTIONS GIVEN AND DISCUSSED: Labs in December and every 6 months  SPECIAL INSTRUCTIONS/FOLLOW-UP: 6 months to see Dr. Mariel Sleet   I acknowledge that I have been informed and understand all the instructions given to me and received a copy. I do not have any more questions at this time, but understand that I may call the Specialty Clinic at [facility] at [phone] during business hours should I have any further questions or need assistance in obtaining follow-up care.    __________________________________________  _____________  __________ Signature of Patient or Authorized Representative            Date                   Time    __________________________________________ Nurse's Signature

## 2012-01-17 NOTE — Progress Notes (Signed)
Patricia Ribas, MD [address] [place] [address]  1. Breast CA   2. Colon cancer     CURRENT THERAPY: Observation and Prolia every 6 months.   INTERVAL HISTORY: Patricia Kline 71 y.o. female returns for  regular  visit for followup of  Stage III adenocarcinoma of the sigmoid colon with resection on 05/29/2004 for a 6.0 cm primary with 6 of 15 positive nodes, and she took adjuvant oxaliplatin and capecitabine for 6 cycles thus far without recurrence with a negative colonoscopy in May 2010, and Dr. Jena Kline plans another one in May 2015.  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 The tumor was 2.0 cm pathologically, status post lumpectomy and sentinel node biopsy, followed by radiation therapy and then we treated with Arimidex adjuvantly for 5 years. She develop the inability tolerate Arimidex and we placed her on tamoxifen which she finished her 5 full years of hormonal therapy. Femara at that time was too costly for her. She had stage II disease at presentation and by MRI.   Patricia Kline is doing well.  She has seen cardiology and PCP and received great reports from them.  She is seeing pain management with regards to her degenerative disease.  She is walking 1- 1 1/2 miles per day and I have encouraged her to continue with that.    She asks if she can have her port-a-cath removed and she is a number of years out from her cancer so I will refer her to Dr. Lovell Kline for Munjor removal.  She reports that the port is tender on long drives when she is wearing her seatbelt in an automobile.  It is also tender when she "bumps it."  She has little soft tissue protection due to her body habitus.  I briefly discussed the port-a-cath removal procedure.   Her main issues remain her back pain and leg pain from her spinal  problems.  Oncologically, she is doing well and we reviewed her lab work, CA 27.29 and CEA are WNL.  I personally reviewed and went over radiographic studies with the patient and her mammogram was BIRADS1.  All in all, Patricia Kline is doing well.  She looks and feels good.  Complete ROS questioning is negative.     Past Medical History  Diagnosis Date  . Breast CA 11/14/2010  . Colon cancer 11/14/2010  . Hypertension   . Osteoporosis   . Hypercholesteremia   . Arthritis   . Peripheral neuropathy   . Depression   . Mitral valve prolapse   . Osteoporosis, senile 03/05/2011  . H/O: hysterectomy   . History of right knee surgery     for bone cancer right knee  . S/P cataract surgery     bil eyes  . Falls     has Breast ca; Colon cancer; Osteoporosis, senile; Arthritis of back; Left leg pain; and Spinal stenosis, lumbar on her problem list.     is allergic to penicillins.  Patricia Kline had no medications administered during this visit.  Past Surgical History  Procedure Date  . Cataract extraction, bilateral   . Retinal hole repair     right  . Abdominal hysterectomy   . Tonsillectomy   . Knee surgery   . Epidural steroid injection with kenalog     Midline    Denies  any headaches, dizziness, double vision, fevers, chills, night sweats, nausea, vomiting, diarrhea, constipation, chest pain, heart palpitations, shortness of breath, blood in stool, black tarry stool, urinary pain, urinary burning, urinary frequency, hematuria.   PHYSICAL EXAMINATION  ECOG PERFORMANCE STATUS: 1 - Symptomatic but completely ambulatory  Filed Vitals:   01/17/12 1015  BP: 117/61  Pulse: 51  Temp: 97.9 F (36.6 C)  Resp: 18    GENERAL:alert, no distress, well nourished, well developed, comfortable, cooperative and smiling SKIN: skin color, texture, turgor are normal, no rashes or significant lesions HEAD: Normocephalic, No masses, lesions, tenderness or abnormalities EYES: normal, Conjunctiva are pink  and non-injected EARS: External ears normal OROPHARYNX:lips, buccal mucosa, and tongue normal and mucous membranes are moist  NECK: supple, no adenopathy, thyroid normal size, non-tender, without nodularity, no stridor, non-tender, trachea midline LYMPH:  no palpable lymphadenopathy, no hepatosplenomegaly BREAST:breasts appear normal, no suspicious masses, no skin or nipple changes or axillary nodes LUNGS: clear to auscultation and percussion HEART: regular rate & rhythm, no murmurs, no gallops, S1 normal and S2 normal ABDOMEN:abdomen soft, non-tender, normal bowel sounds, no masses or organomegaly and no hepatosplenomegaly BACK: Back symmetric, no curvature., No CVA tenderness EXTREMITIES:less then 2 second capillary refill, no joint deformities, effusion, or inflammation, no edema, no skin discoloration, no clubbing, no cyanosis  NEURO: alert & oriented x 3 with fluent speech, no focal motor/sensory deficits, gait normal   LABORATORY DATA: CBC    Component Value Date/Time   WBC 4.8 12/04/2011 0907   RBC 3.66* 12/04/2011 0907   HGB 11.6* 12/04/2011 0907   HCT 34.5* 12/04/2011 0907   PLT 158 12/04/2011 0907   MCV 94.3 12/04/2011 0907   MCH 31.7 12/04/2011 0907   MCHC 33.6 12/04/2011 0907   RDW 12.7 12/04/2011 0907   LYMPHSABS 1.0 12/04/2011 0907   MONOABS 0.5 12/04/2011 0907   EOSABS 0.1 12/04/2011 0907   BASOSABS 0.0 12/04/2011 0907      Chemistry      Component Value Date/Time   NA 141 12/04/2011 0907   K 3.8 12/04/2011 0907   CL 104 12/04/2011 0907   CO2 28 12/04/2011 0907   BUN 12 12/04/2011 0907   CREATININE 0.90 12/04/2011 0907      Component Value Date/Time   CALCIUM 9.0 12/04/2011 0907   ALKPHOS 30* 07/10/2011 1121   AST 16 07/10/2011 1121   ALT 19 07/10/2011 1121   BILITOT 0.4 07/10/2011 1121     Lab Results  Component Value Date   CEA 1.0 12/04/2011   Lab Results  Component Value Date   LABCA2 13 07/10/2011     RADIOGRAPHIC STUDIES:  10/13/2011  *RADIOLOGY REPORT*  Clinical Data: History of  right breast cancer status post  lumpectomy 2006  DIGITAL DIAGNOSTIC BILATERAL MAMMOGRAM WITH CAD  Comparison: With priors  Findings: There is a dense fibroglandular pattern. Stable  lumpectomy changes are seen in the right breast. There is no new  suspicious mass or malignant-type microcalcifications.  Mammographic images were processed with CAD.  IMPRESSION:  No evidence of malignancy in either breast.  RECOMMENDATION:  Screening mammogram in 1 year is recommended.  BI-RADS CATEGORY 2: Benign finding(s).  Original Report Authenticated By: Littie Deeds. ARCEO, M.D.     ASSESSMENT:  1. 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 Jena Kline plans another one in May 2015.  2. 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 The tumor was 2.0 cm pathologically, status post lumpectomy and sentinel node biopsy, followed by radiation therapy and then we treated with Arimidex adjuvantly for 5 years. She develop the inability tolerate Arimidex and we placed her on tamoxifen which she finished her 5 full years of hormonal therapy. Femara at that time was too costly for her. She had stage II disease at presentation and by MRI.  3. Shortness of breath with a rapid heart rate during the summer while cutting grass and she has been referred back to Dr. Alanda Amass.  4. Degenerative disk and joint disease with spinal stenosis of the low back with weakness of both legs and pain in the left leg in particular for which she has seen Dr. Eduard Clos.  She is to see a neurologist in [place].  She has also seen Dr. Fuller Canada, no evidence of cancer in  the bones. This is all degenerative.  5. Osteoporosis switched to prolia and is scheduled on 03/14/12, calcium, vitamin D.  6. Benign breast biopsy in 2000.  7. Right retinal hole repair by Dr. Fawn Kirk in the past.  8. Hypercholesterolemia.  9. Hypertension.  10.Right-sided breast pain intermittently since her surgery.  11.Mitral valve prolapse with a click and murmur.  12.Peripheral neuropathy on gabapentin 300 mg q.i.d. since her chemotherapy for colon cancer and that is somewhat improved.  13.Mild thrombocytopenia.  14.Depression much improved on Celexa.   PLAN:  1. I personally reviewed and went over laboratory results with the patient. 2. I personally reviewed and went over radiographic studies with the patient. 3. Lab work in December before Prolia injection: CBC diff, CMET, CEA, CA 27.29. 4. Lab work thereafter every 6 months before Prolia injection ordered as a standing order: CBC diff, CMET, CA 27.29, CEA. 5. Prolia injection every 6 months.  She is due in December 2013. 6. Patient encouraged to continue with calcium and Vit D supplementation. 7. She already had her flu immunization elsewhere.  8. Dr. Jena Kline plans a repeat colonoscopy in May 2015. 9.  Referral to Dr. Lovell Kline for port-a-cath removal.  10.  Return in 6 months for follow-up.    All questions were answered. The patient knows to call the clinic with any problems, questions or concerns. We can certainly see the patient much sooner if necessary.  The patient and plan discussed with Glenford Peers, MD and he is in agreement with the aforementioned.   Ebrima Ranta

## 2012-01-31 DIAGNOSIS — Z853 Personal history of malignant neoplasm of breast: Secondary | ICD-10-CM | POA: Diagnosis not present

## 2012-02-04 ENCOUNTER — Ambulatory Visit (HOSPITAL_COMMUNITY)
Admission: RE | Admit: 2012-02-04 | Discharge: 2012-02-04 | Disposition: A | Discharge status: Home / Self Care | Payer: Medicare Other | Source: Ambulatory Visit | Attending: General Surgery | Admitting: General Surgery

## 2012-02-04 ENCOUNTER — Encounter (HOSPITAL_COMMUNITY): Payer: Self-pay | Admitting: *Deleted

## 2012-02-04 ENCOUNTER — Encounter (HOSPITAL_COMMUNITY)
Admission: RE | Disposition: A | Discharge status: Home / Self Care | Payer: Self-pay | Source: Ambulatory Visit | Attending: General Surgery

## 2012-02-04 DIAGNOSIS — C50919 Malignant neoplasm of unspecified site of unspecified female breast: Secondary | ICD-10-CM

## 2012-02-04 DIAGNOSIS — Z452 Encounter for adjustment and management of vascular access device: Secondary | ICD-10-CM | POA: Diagnosis not present

## 2012-02-04 DIAGNOSIS — C189 Malignant neoplasm of colon, unspecified: Secondary | ICD-10-CM

## 2012-02-04 DIAGNOSIS — I10 Essential (primary) hypertension: Secondary | ICD-10-CM | POA: Diagnosis not present

## 2012-02-04 HISTORY — PX: PORT-A-CATH REMOVAL: SHX5289

## 2012-02-04 SURGERY — REMOVAL PORT-A-CATH
Anesthesia: LOCAL

## 2012-02-04 MED ORDER — LIDOCAINE HCL (PF) 1 % IJ SOLN
INTRAMUSCULAR | Status: AC
  Filled 2012-02-04: qty 30

## 2012-02-04 MED ORDER — HYDROCODONE-ACETAMINOPHEN 5-325 MG PO TABS: 1.0000 | ORAL_TABLET | ORAL | Status: DC | PRN

## 2012-02-04 MED ORDER — LIDOCAINE HCL (PF) 1 % IJ SOLN
INTRAMUSCULAR | Status: DC | PRN
  Administered 2012-02-04: 10 mL via INTRADERMAL

## 2012-02-04 SURGICAL SUPPLY — 21 items
BAG HAMPER (MISCELLANEOUS) IMPLANT
CLOTH BEACON ORANGE TIMEOUT ST (SAFETY) IMPLANT
COVER LIGHT HANDLE STERIS (MISCELLANEOUS) IMPLANT
DURAPREP 26ML APPLICATOR (WOUND CARE) IMPLANT
ELECT REM PT RETURN 9FT ADLT (ELECTROSURGICAL)
ELECTRODE REM PT RTRN 9FT ADLT (ELECTROSURGICAL) IMPLANT
GLOVE BIOGEL PI IND STRL 7.5 (GLOVE) IMPLANT
GLOVE BIOGEL PI INDICATOR 7.5 (GLOVE)
GOWN STRL REIN XL XLG (GOWN DISPOSABLE) IMPLANT
KIT ROOM TURNOVER APOR (KITS) IMPLANT
MANIFOLD NEPTUNE II (INSTRUMENTS) IMPLANT
NEEDLE HYPO 25X1 1.5 SAFETY (NEEDLE) IMPLANT
NS IRRIG 1000ML POUR BTL (IV SOLUTION) IMPLANT
PACK MINOR (CUSTOM PROCEDURE TRAY) ×2 IMPLANT
PAD ARMBOARD 7.5X6 YLW CONV (MISCELLANEOUS) IMPLANT
SET BASIN LINEN APH (SET/KITS/TRAYS/PACK) IMPLANT
SPONGE GAUZE 2X2 8PLY STRL LF (GAUZE/BANDAGES/DRESSINGS) IMPLANT
STRIP CLOSURE SKIN 1/4X3 (GAUZE/BANDAGES/DRESSINGS) IMPLANT
SUT MNCRL AB 4-0 PS2 18 (SUTURE) IMPLANT
SYR CONTROL 10ML LL (SYRINGE) IMPLANT
TOWEL OR 17X26 4PK STRL BLUE (TOWEL DISPOSABLE) IMPLANT

## 2012-02-04 NOTE — Op Note (Signed)
Patient:  Patricia Kline  DOB:  02-Feb-1941  MRN:  [id number]   Preop Diagnosis:  History of colon cancer and breast cancer  Postop Diagnosis:  Same  Procedure:  Removal of Port-A-Cath  Surgeon:  Dr. Tilford Pillar  Anes: 1 Percent lidocaine plain  Indications:  Patient is a 71 year old female with a history of cancer who is completed all of her chemotherapy treatments. She has an indwelling Port-A-Cath which she is only utilizing for blood draws at this time. These times utilization are infrequent and patient requests removal the port. Risks benefits and alternatives were discussed. Her questions and concerns are addressed. Patient was consented for the planned procedure.  Procedure note:  Patient was taken to the minor procedure room was placed in a supine position on the cart. Her left shoulder and neck were prepped with chlorhexidine solution and draped in standard fashion. 1% lidocaine was utilized to suture the local anesthetic. A 15 blade scalpel was then utilized to create an incision at the previous incisional scar of the port. The subcutaneous tissue was then dissected using combination of sharp and blunt dissection. The port capsule was entered into sharply and the port was delivered intact. He was removed and pressure was held under the left clavicle for proximally 5 minutes. Hemostasis was excellent. At this time I turned my attention to closure. A 4-0 Monocryl was utilized reapproximate the skin edges in a running subcuticular suture. The skin was washed dried moist dry towel. Steri-Strips are placed over the incision. Sharps were disposed of in proper accordance. Patient tolerated procedure she well.  Complications:  None apparent  EBL:  Minimal  Specimen:  None

## 2012-02-04 NOTE — H&P (Signed)
  NTS SOAP Note  Vital Signs:  Vitals as of: 01/31/2012: Systolic 146: Diastolic 68: Heart Rate 88: Temp 28F: Height 68ft 4in: Weight 112Lbs 5 Ounces: BMI 19  BMI : 19.28 kg/m2  Subjective: This 71 Years 61 Months old Female presents for of complaints of Port-A-Cath.  patient is had the Port-A-Cath since treatment for her breast cancer as well as her colon cancer. She states they are no longer using this. They're only accessing it and frequently for blood draws. She's had no difficulty with port however at this time she is in discomfort. She wishes to have it removed.  Review of Symptoms:  Constitutional:unremarkable   Head:unremarkable    Eyes:unremarkable   Nose/Mouth/Throat:unremarkable Cardiovascular:  unremarkable   Respiratory:unremarkable   Gastrointestinal:  unremarkable   Genitourinary:unremarkable     Musculoskeletal:unremarkable   Skin:unremarkable Breast:unremarkable   Hematolgic/Lymphatic:unremarkable     Allergic/Immunologic:unremarkable     Past Medical History:  Obtained     Past Medical History  Pregnancy Gravida: 1 Pregnancy Para: 1 Surgical History: tonsillectomy, hysterectomy, colon resection, partial mastectomy right, cataracts bilaterally Medical Problems: history of breast cancer, history: Cancer, hypertension, osteoporosis, hyper cholesterolemia, osteoarthritis, depression, mitral valve prolapse Psychiatric History: depression Allergies: penicillin Medications: gabapentin, alprazolam, citalopram, lisinopril, Hydrochlorothiazide, pravastatin, Norco   Social History:Obtained  Social History  Preferred Language: English Race:  White Ethnicity: Not Hispanic / Latino Age: 71 Years 11 Months Marital Status:  M Alcohol: none Recreational drug(s): none   Smoking Status: Never smoker reviewed on 02/01/2012 Functional Status reviewed on  mm/dd/yyyy ------------------------------------------------ Bathing: Normal Cooking: Normal Dressing: Normal Driving: Normal Eating: Normal Managing Meds: Normal Oral Care: Normal Shopping: Normal Toileting: Normal Transferring: Normal Walking: Normal Cognitive Status reviewed on mm/dd/yyyy ------------------------------------------------ Attention: Normal Decision Making: Normal Language: Normal Memory: Normal Motor: Normal Perception: Normal Problem Solving: Normal Visual and Spatial: Normal   Family History:Obtained     Family History  Other:  noncontributory    Objective Information: General:  Well appearing, well nourished in no distress. Skin:     no rash or prominent lesions.  port is present in the leftupper chest. no erythema . Head:Atraumatic; no masses; no abnormalities Eyes:  conjunctiva clear, EOM intact, PERRL Mouth:  Mucous membranes moist, no mucosal lesions. Neck:  Supple without lymphadenopathy.  Heart:  RRR, no murmur Lungs:    CTA bilaterally, no wheezes, rhonchi, rales.  Breathing unlabored. Breasts:    No lumps or nipple discharge Abdomen:Soft, NT/ND, no HSM, no masses. Extremities:  No deformities, clubbing, cyanosis, or edema.   Assessment:    Plan: history of breast cancer, history of colon cancer. She now has a non-utilized port. I did discuss with the patient options for removal. She does wish to proceed. We will schedule this at her convenience.  Patient Education:Alternative treatments to surgery were discussed with patient (and family).  Risks and benefits  of procedure were fully explained to the patient (and family) who gave informed consent. Patient/family questions were addressed.  Follow-up:Pending Surgery

## 2012-02-06 ENCOUNTER — Encounter (HOSPITAL_COMMUNITY): Payer: Self-pay | Admitting: General Surgery

## 2012-02-08 DIAGNOSIS — E785 Hyperlipidemia, unspecified: Secondary | ICD-10-CM | POA: Diagnosis not present

## 2012-03-10 ENCOUNTER — Encounter (HOSPITAL_COMMUNITY): Payer: Medicare Other | Attending: Oncology

## 2012-03-10 DIAGNOSIS — M810 Age-related osteoporosis without current pathological fracture: Secondary | ICD-10-CM | POA: Insufficient documentation

## 2012-03-10 DIAGNOSIS — C50919 Malignant neoplasm of unspecified site of unspecified female breast: Secondary | ICD-10-CM

## 2012-03-10 DIAGNOSIS — G609 Hereditary and idiopathic neuropathy, unspecified: Secondary | ICD-10-CM | POA: Insufficient documentation

## 2012-03-10 DIAGNOSIS — C189 Malignant neoplasm of colon, unspecified: Secondary | ICD-10-CM | POA: Diagnosis not present

## 2012-03-10 DIAGNOSIS — G629 Polyneuropathy, unspecified: Secondary | ICD-10-CM

## 2012-03-10 LAB — COMPREHENSIVE METABOLIC PANEL
Alkaline Phosphatase: 35 U/L — ABNORMAL LOW (ref 39–117)
BUN: 19 mg/dL (ref 6–23)
CO2: 30 mEq/L (ref 19–32)
Calcium: 9.7 mg/dL (ref 8.4–10.5)
GFR calc Af Amer: 59 mL/min — ABNORMAL LOW (ref >90)
GFR calc non Af Amer: 51 mL/min — ABNORMAL LOW (ref >90)
Glucose, Bld: 80 mg/dL (ref 70–99)
Total Protein: 6.9 g/dL (ref 6.0–8.3)

## 2012-03-10 LAB — CBC WITH DIFFERENTIAL/PLATELET
Eosinophils Absolute: 0.1 10*3/uL (ref 0.0–0.7)
Eosinophils Relative: 2 % (ref 0–5)
HCT: 38.7 % (ref 36.0–46.0)
Hemoglobin: 13 g/dL (ref 12.0–15.0)
Lymphs Abs: 1.2 10*3/uL (ref 0.7–4.0)
MCH: 30.9 pg (ref 26.0–34.0)
MCV: 91.9 fL (ref 78.0–100.0)
Monocytes Relative: 9 % (ref 3–12)
RBC: 4.21 MIL/uL (ref 3.87–5.11)

## 2012-03-10 LAB — CEA: CEA: 0.9 ng/mL (ref 0.0–5.0)

## 2012-03-10 NOTE — Progress Notes (Signed)
Labs drawn today for cbc/diff,cmp,cea,[id number] 

## 2012-03-14 ENCOUNTER — Encounter (HOSPITAL_BASED_OUTPATIENT_CLINIC_OR_DEPARTMENT_OTHER): Payer: Medicare Other

## 2012-03-14 VITALS — BP 145/75 | HR 48 | Temp 97.4°F | Resp 16

## 2012-03-14 DIAGNOSIS — M810 Age-related osteoporosis without current pathological fracture: Secondary | ICD-10-CM | POA: Diagnosis not present

## 2012-03-14 MED ORDER — DENOSUMAB 60 MG/ML ~~LOC~~ SOLN
60.0000 mg | Freq: Once | SUBCUTANEOUS | Status: AC
  Administered 2012-03-14: 60 mg via SUBCUTANEOUS
  Filled 2012-03-14: qty 1

## 2012-03-14 NOTE — Progress Notes (Signed)
Tolerated injection well. 

## 2012-03-15 ENCOUNTER — Ambulatory Visit (HOSPITAL_COMMUNITY): Payer: Medicare Other

## 2012-04-07 DIAGNOSIS — E785 Hyperlipidemia, unspecified: Secondary | ICD-10-CM | POA: Diagnosis not present

## 2012-04-07 DIAGNOSIS — I10 Essential (primary) hypertension: Secondary | ICD-10-CM | POA: Diagnosis not present

## 2012-07-01 DIAGNOSIS — M5126 Other intervertebral disc displacement, lumbar region: Secondary | ICD-10-CM | POA: Diagnosis not present

## 2012-07-01 DIAGNOSIS — M545 Low back pain: Secondary | ICD-10-CM | POA: Diagnosis not present

## 2012-07-01 DIAGNOSIS — M48062 Spinal stenosis, lumbar region with neurogenic claudication: Secondary | ICD-10-CM | POA: Diagnosis not present

## 2012-07-01 DIAGNOSIS — IMO0002 Reserved for concepts with insufficient information to code with codable children: Secondary | ICD-10-CM | POA: Diagnosis not present

## 2012-07-01 DIAGNOSIS — M5137 Other intervertebral disc degeneration, lumbosacral region: Secondary | ICD-10-CM | POA: Diagnosis not present

## 2012-07-02 DIAGNOSIS — M5137 Other intervertebral disc degeneration, lumbosacral region: Secondary | ICD-10-CM | POA: Diagnosis not present

## 2012-07-02 DIAGNOSIS — M545 Low back pain: Secondary | ICD-10-CM | POA: Diagnosis not present

## 2012-07-02 DIAGNOSIS — M5126 Other intervertebral disc displacement, lumbar region: Secondary | ICD-10-CM | POA: Diagnosis not present

## 2012-07-02 DIAGNOSIS — IMO0002 Reserved for concepts with insufficient information to code with codable children: Secondary | ICD-10-CM | POA: Diagnosis not present

## 2012-07-14 ENCOUNTER — Encounter (HOSPITAL_COMMUNITY): Payer: Medicare Other | Attending: Oncology | Admitting: Oncology

## 2012-07-14 ENCOUNTER — Ambulatory Visit (HOSPITAL_COMMUNITY): Payer: Medicare Other | Admitting: Oncology

## 2012-07-14 VITALS — BP 140/67 | HR 52 | Temp 97.8°F | Resp 18 | Wt 112.8 lb

## 2012-07-14 DIAGNOSIS — Z853 Personal history of malignant neoplasm of breast: Secondary | ICD-10-CM | POA: Diagnosis not present

## 2012-07-14 DIAGNOSIS — Z85038 Personal history of other malignant neoplasm of large intestine: Secondary | ICD-10-CM | POA: Diagnosis not present

## 2012-07-14 DIAGNOSIS — G609 Hereditary and idiopathic neuropathy, unspecified: Secondary | ICD-10-CM

## 2012-07-14 DIAGNOSIS — C50911 Malignant neoplasm of unspecified site of right female breast: Secondary | ICD-10-CM

## 2012-07-14 DIAGNOSIS — C189 Malignant neoplasm of colon, unspecified: Secondary | ICD-10-CM

## 2012-07-14 DIAGNOSIS — M810 Age-related osteoporosis without current pathological fracture: Secondary | ICD-10-CM

## 2012-07-14 NOTE — Progress Notes (Signed)
Diagnosis #1 stage III adenocarcinoma sigmoid colon status post resection on 05/29/2004 for is 6.0 cm cancer with 6 of 15 positive lymph nodes. We gave her oxaliplatinum and capecitabine adjuvantly for 6 cycles thus far without recurrence. Should negative colonoscopy in May 2010 and is due for another one in May 2015 according to Dr. Kendell Bane.  #2 stage I (T1 C. N0 M0) invasive ductal carcinoma of the right breast, ER 89% positive, PR 10% positive, HER-2/neu nonamplified, Ki-67 marker 16% status post surgical resection on 12/20/2004 after possibly 4 months of adjuvant Arimidex. The cancer was 2.0 cm pathologically, status post lumpectomy, sentinel node biopsy followed by radiation therapy followed by adjuvant Arimidex for 5 years. She cannot tolerate Arimidex so we switched her to tamoxifen. We had tried letrozole also but that was too costly for her. She therefore had stage II disease clinically at presentation and by MRI, but was reduced to stage I at the time of surgery by the adjuvant hormonal therapy.  #3 degenerative disc and joint disease with spinal stenosis of the low back. She sees Dr. Eduard Clos and recently had a epidural steroid injection which helped to look left leg and hip discomfort significantly she states.  #4 mitral valve prolapse #5 hypertension #6 peripheral neuropathy #7 osteoporosis and she needs a bone density repeated in August of this year which we'll schedule. She is undergoing therapy for this every 6 months with Prolia, calcium, vitamin D  Her oncology review of systems reveals no distinct and that she is recurrent breast cancer or colon cancer.  Her bowels are working well. She is fully functional. She has a normal performance status. She is now 72 years old.  She has no lymphadenopathy. Her vital signs are stable. Clear lung fields. Heart shows a regular rhythm and rate without distinct murmur rub or gallop. Abdomen remains soft and nontender without hepatosplenomegaly. Bowel  sounds are normal. There Is no ascites. There is no distention. The left breast is negative for masses. The right breast shows mild changes from surgery and radiation but shows no masses. She has no arm or leg edema.  She is alert and oriented. Facial symmetry is intact. There is no thyromegaly.  We will continue to monitor her. She is due for blood work prior to her Prolia in June. We'll do the bone density in August and we will see her in 6 months. Her old Port-A-Cath site is well-healed and she has had her port removed.

## 2012-07-14 NOTE — Patient Instructions (Addendum)
.  [facility] Discharge Instructions  RECOMMENDATIONS MADE BY THE CONSULTANT AND ANY TEST RESULTS WILL BE SENT TO YOUR REFERRING PHYSICIAN.  EXAM FINDINGS BY THE PHYSICIAN TODAY AND SIGNS OR SYMPTOMS TO REPORT TO CLINIC OR PRIMARY PHYSICIAN: Exam is great   INSTRUCTIONS GIVEN AND DISCUSSED: Labs in June and Bone density in August  SPECIAL INSTRUCTIONS/FOLLOW-UP: 6 months to see Jenita Seashore PA  Thank you for choosing [facility] to provide your oncology and hematology care.  To afford each patient quality time with our providers, please arrive at least 15 minutes before your scheduled appointment time.  With your help, our goal is to use those 15 minutes to complete the necessary work-up to ensure our physicians have the information they need to help with your evaluation and healthcare recommendations.    Effective January 1st, 2014, we ask that you re-schedule your appointment with our physicians should you arrive 10 or more minutes late for your appointment.  We strive to give you quality time with our providers, and arriving late affects you and other patients whose appointments are after yours.    Again, thank you for choosing [facility].  Our hope is that these requests will decrease the amount of time that you wait before being seen by our physicians.       _____________________________________________________________  Should you have questions after your visit to [facility], please contact our office at [phone] between the hours of 8:30 a.m. and 5:00 p.m.  Voicemails left after 4:30 p.m. will not be returned until the following business day.  For prescription refill requests, have your pharmacy contact our office with your prescription refill request.

## 2012-07-17 ENCOUNTER — Ambulatory Visit (HOSPITAL_COMMUNITY): Payer: Medicare Other

## 2012-07-21 DIAGNOSIS — IMO0002 Reserved for concepts with insufficient information to code with codable children: Secondary | ICD-10-CM | POA: Diagnosis not present

## 2012-07-21 DIAGNOSIS — Z23 Encounter for immunization: Secondary | ICD-10-CM | POA: Diagnosis not present

## 2012-07-21 DIAGNOSIS — I10 Essential (primary) hypertension: Secondary | ICD-10-CM | POA: Diagnosis not present

## 2012-07-21 DIAGNOSIS — E785 Hyperlipidemia, unspecified: Secondary | ICD-10-CM | POA: Diagnosis not present

## 2012-07-22 DIAGNOSIS — I10 Essential (primary) hypertension: Secondary | ICD-10-CM | POA: Diagnosis not present

## 2012-07-22 DIAGNOSIS — IMO0002 Reserved for concepts with insufficient information to code with codable children: Secondary | ICD-10-CM | POA: Diagnosis not present

## 2012-07-22 DIAGNOSIS — E785 Hyperlipidemia, unspecified: Secondary | ICD-10-CM | POA: Diagnosis not present

## 2012-09-02 DIAGNOSIS — H43399 Other vitreous opacities, unspecified eye: Secondary | ICD-10-CM | POA: Diagnosis not present

## 2012-09-02 DIAGNOSIS — H26499 Other secondary cataract, unspecified eye: Secondary | ICD-10-CM | POA: Diagnosis not present

## 2012-09-02 DIAGNOSIS — Z961 Presence of intraocular lens: Secondary | ICD-10-CM | POA: Diagnosis not present

## 2012-09-02 DIAGNOSIS — H353190 Nonexudative age-related macular degeneration, unspecified eye, stage unspecified: Secondary | ICD-10-CM | POA: Diagnosis not present

## 2012-09-12 ENCOUNTER — Encounter (HOSPITAL_BASED_OUTPATIENT_CLINIC_OR_DEPARTMENT_OTHER): Payer: Medicare Other

## 2012-09-12 ENCOUNTER — Encounter (HOSPITAL_COMMUNITY): Payer: Medicare Other | Attending: Oncology

## 2012-09-12 VITALS — BP 122/50 | HR 54

## 2012-09-12 DIAGNOSIS — G609 Hereditary and idiopathic neuropathy, unspecified: Secondary | ICD-10-CM | POA: Diagnosis not present

## 2012-09-12 DIAGNOSIS — M810 Age-related osteoporosis without current pathological fracture: Secondary | ICD-10-CM | POA: Diagnosis not present

## 2012-09-12 DIAGNOSIS — C189 Malignant neoplasm of colon, unspecified: Secondary | ICD-10-CM

## 2012-09-12 DIAGNOSIS — C50919 Malignant neoplasm of unspecified site of unspecified female breast: Secondary | ICD-10-CM | POA: Diagnosis not present

## 2012-09-12 DIAGNOSIS — G629 Polyneuropathy, unspecified: Secondary | ICD-10-CM

## 2012-09-12 LAB — CBC WITH DIFFERENTIAL/PLATELET
Basophils Absolute: 0 10*3/uL (ref 0.0–0.1)
Basophils Relative: 1 % (ref 0–1)
Eosinophils Relative: 1 % (ref 0–5)
Lymphocytes Relative: 18 % (ref 12–46)
MCHC: 34.1 g/dL (ref 30.0–36.0)
MCV: 93 fL (ref 78.0–100.0)
Monocytes Absolute: 0.7 10*3/uL (ref 0.1–1.0)
Platelets: 199 10*3/uL (ref 150–400)
RDW: 14 % (ref 11.5–15.5)
WBC: 7.2 10*3/uL (ref 4.0–10.5)

## 2012-09-12 LAB — COMPREHENSIVE METABOLIC PANEL
AST: 15 U/L (ref 0–37)
Albumin: 3.9 g/dL (ref 3.5–5.2)
BUN: 28 mg/dL — ABNORMAL HIGH (ref 6–23)
Calcium: 9.1 mg/dL (ref 8.4–10.5)
Chloride: 99 mEq/L (ref 96–112)
Creatinine, Ser: 1.16 mg/dL — ABNORMAL HIGH (ref 0.50–1.10)
Total Bilirubin: 0.4 mg/dL (ref 0.3–1.2)
Total Protein: 6.7 g/dL (ref 6.0–8.3)

## 2012-09-12 MED ORDER — DENOSUMAB 60 MG/ML ~~LOC~~ SOLN
60.0000 mg | Freq: Once | SUBCUTANEOUS | Status: AC
  Administered 2012-09-12: 60 mg via SUBCUTANEOUS
  Filled 2012-09-12: qty 1

## 2012-09-12 NOTE — Progress Notes (Signed)
Labs drawn today for cbc/diff,cmp,cea,[id number] 

## 2012-09-12 NOTE — Progress Notes (Signed)
Prolia 60 mg given sub-q in lower rt abd tissue.  Tolerated well.

## 2012-09-13 LAB — CANCER ANTIGEN 27.29: CA 27.29: 14 U/mL (ref 0–39)

## 2012-09-13 LAB — CEA: CEA: 1.3 ng/mL (ref 0.0–5.0)

## 2012-09-15 ENCOUNTER — Encounter (HOSPITAL_COMMUNITY): Payer: Self-pay | Admitting: Pharmacy Technician

## 2012-09-23 ENCOUNTER — Encounter (HOSPITAL_COMMUNITY): Payer: Self-pay | Admitting: *Deleted

## 2012-09-23 ENCOUNTER — Ambulatory Visit (HOSPITAL_COMMUNITY)
Admission: RE | Admit: 2012-09-23 | Discharge: 2012-09-23 | Disposition: A | Discharge status: Home / Self Care | Payer: Medicare Other | Source: Ambulatory Visit | Attending: Ophthalmology | Admitting: Ophthalmology

## 2012-09-23 ENCOUNTER — Encounter (HOSPITAL_COMMUNITY)
Admission: RE | Disposition: A | Discharge status: Home / Self Care | Payer: Self-pay | Source: Ambulatory Visit | Attending: Ophthalmology

## 2012-09-23 DIAGNOSIS — H26499 Other secondary cataract, unspecified eye: Secondary | ICD-10-CM | POA: Insufficient documentation

## 2012-09-23 HISTORY — PX: YAG LASER APPLICATION: SHX6189

## 2012-09-23 SURGERY — TREATMENT, USING YAG LASER
Anesthesia: LOCAL | Laterality: Left

## 2012-09-23 MED ORDER — TROPICAMIDE 1 % OP SOLN
1.0000 [drp] | OPHTHALMIC | Status: AC
  Administered 2012-09-23 (×2): 1 [drp] via OPHTHALMIC

## 2012-09-23 MED ORDER — TROPICAMIDE 1 % OP SOLN
OPHTHALMIC | Status: AC
  Filled 2012-09-23: qty 3

## 2012-09-23 NOTE — Brief Op Note (Addendum)
Akaila Rambo Hemann 09/23/2012  Marcelyn Bruins. Nile Riggs, MD  Pre-op diagnosis: opacification left eye  Post-op diagnosis: opacification left eye    Yag Laser Self Test Completedyes. Procedure: Posterior Capsulotomy, left eye.  Eye Protection Worn by Staff yes. Laser In Use Sign on Door yes.  Laser: Nd:YAG Spot Size: Fixed Burst Mode: III Power Setting: 3,3 mJ/burst  Number of shots: 15 Total energy delivered: 48.4 mJ  Patency of the peripheral iridotomy was confirmed visually.  The patient tolerated the procedure without difficulty. No complications were encountered.    The patient was discharged home with the instructions to continue all her current glaucoma medications, if any.   Patient instructed to go to office at 0100 for intraocular pressure check.  Patient verbalizes understanding of discharge instructions yes.

## 2012-09-23 NOTE — H&P (Signed)
The patient was re examined and there is no change in the patients condition since the original H and P. 

## 2012-09-25 ENCOUNTER — Encounter (HOSPITAL_COMMUNITY): Payer: Self-pay | Admitting: Ophthalmology

## 2012-09-29 ENCOUNTER — Other Ambulatory Visit (HOSPITAL_COMMUNITY): Payer: Self-pay | Admitting: Family Medicine

## 2012-09-29 DIAGNOSIS — Z139 Encounter for screening, unspecified: Secondary | ICD-10-CM

## 2012-10-20 ENCOUNTER — Ambulatory Visit (HOSPITAL_COMMUNITY)
Admission: RE | Admit: 2012-10-20 | Discharge: 2012-10-20 | Disposition: A | Discharge status: Home / Self Care | Payer: Medicare Other | Source: Ambulatory Visit | Attending: Family Medicine | Admitting: Family Medicine

## 2012-10-20 DIAGNOSIS — Z1231 Encounter for screening mammogram for malignant neoplasm of breast: Secondary | ICD-10-CM | POA: Insufficient documentation

## 2012-10-20 DIAGNOSIS — Z139 Encounter for screening, unspecified: Secondary | ICD-10-CM

## 2012-10-22 DIAGNOSIS — R269 Unspecified abnormalities of gait and mobility: Secondary | ICD-10-CM | POA: Diagnosis not present

## 2012-10-22 DIAGNOSIS — M5137 Other intervertebral disc degeneration, lumbosacral region: Secondary | ICD-10-CM | POA: Diagnosis not present

## 2012-10-22 DIAGNOSIS — G894 Chronic pain syndrome: Secondary | ICD-10-CM | POA: Diagnosis not present

## 2012-10-22 DIAGNOSIS — IMO0002 Reserved for concepts with insufficient information to code with codable children: Secondary | ICD-10-CM | POA: Diagnosis not present

## 2012-10-22 DIAGNOSIS — M545 Low back pain: Secondary | ICD-10-CM | POA: Diagnosis not present

## 2012-10-22 DIAGNOSIS — M48062 Spinal stenosis, lumbar region with neurogenic claudication: Secondary | ICD-10-CM | POA: Diagnosis not present

## 2012-11-10 ENCOUNTER — Emergency Department (HOSPITAL_COMMUNITY)
Admission: EM | Admit: 2012-11-10 | Discharge: 2012-11-10 | Disposition: A | Discharge status: Home / Self Care | Payer: Medicare Other | Attending: Emergency Medicine | Admitting: Emergency Medicine

## 2012-11-10 ENCOUNTER — Encounter (HOSPITAL_COMMUNITY): Payer: Self-pay | Admitting: *Deleted

## 2012-11-10 DIAGNOSIS — Z90710 Acquired absence of both cervix and uterus: Secondary | ICD-10-CM | POA: Diagnosis not present

## 2012-11-10 DIAGNOSIS — Z8679 Personal history of other diseases of the circulatory system: Secondary | ICD-10-CM | POA: Diagnosis not present

## 2012-11-10 DIAGNOSIS — Z9181 History of falling: Secondary | ICD-10-CM | POA: Insufficient documentation

## 2012-11-10 DIAGNOSIS — I10 Essential (primary) hypertension: Secondary | ICD-10-CM | POA: Insufficient documentation

## 2012-11-10 DIAGNOSIS — Z85830 Personal history of malignant neoplasm of bone: Secondary | ICD-10-CM | POA: Diagnosis not present

## 2012-11-10 DIAGNOSIS — Y93H2 Activity, gardening and landscaping: Secondary | ICD-10-CM | POA: Insufficient documentation

## 2012-11-10 DIAGNOSIS — Z853 Personal history of malignant neoplasm of breast: Secondary | ICD-10-CM | POA: Diagnosis not present

## 2012-11-10 DIAGNOSIS — Z8739 Personal history of other diseases of the musculoskeletal system and connective tissue: Secondary | ICD-10-CM | POA: Diagnosis not present

## 2012-11-10 DIAGNOSIS — Z9849 Cataract extraction status, unspecified eye: Secondary | ICD-10-CM | POA: Insufficient documentation

## 2012-11-10 DIAGNOSIS — G609 Hereditary and idiopathic neuropathy, unspecified: Secondary | ICD-10-CM | POA: Diagnosis not present

## 2012-11-10 DIAGNOSIS — F329 Major depressive disorder, single episode, unspecified: Secondary | ICD-10-CM | POA: Diagnosis not present

## 2012-11-10 DIAGNOSIS — E7800 Pure hypercholesterolemia, unspecified: Secondary | ICD-10-CM | POA: Insufficient documentation

## 2012-11-10 DIAGNOSIS — Z85038 Personal history of other malignant neoplasm of large intestine: Secondary | ICD-10-CM | POA: Diagnosis not present

## 2012-11-10 DIAGNOSIS — Z79899 Other long term (current) drug therapy: Secondary | ICD-10-CM | POA: Insufficient documentation

## 2012-11-10 DIAGNOSIS — Z880 Allergy status to penicillin: Secondary | ICD-10-CM | POA: Insufficient documentation

## 2012-11-10 DIAGNOSIS — Z9889 Other specified postprocedural states: Secondary | ICD-10-CM | POA: Diagnosis not present

## 2012-11-10 DIAGNOSIS — T6391XA Toxic effect of contact with unspecified venomous animal, accidental (unintentional), initial encounter: Secondary | ICD-10-CM | POA: Insufficient documentation

## 2012-11-10 DIAGNOSIS — T63461A Toxic effect of venom of wasps, accidental (unintentional), initial encounter: Secondary | ICD-10-CM | POA: Insufficient documentation

## 2012-11-10 DIAGNOSIS — M129 Arthropathy, unspecified: Secondary | ICD-10-CM | POA: Insufficient documentation

## 2012-11-10 DIAGNOSIS — F3289 Other specified depressive episodes: Secondary | ICD-10-CM | POA: Insufficient documentation

## 2012-11-10 DIAGNOSIS — Y92009 Unspecified place in unspecified non-institutional (private) residence as the place of occurrence of the external cause: Secondary | ICD-10-CM | POA: Insufficient documentation

## 2012-11-10 DIAGNOSIS — L299 Pruritus, unspecified: Secondary | ICD-10-CM | POA: Diagnosis not present

## 2012-11-10 MED ORDER — IBUPROFEN 800 MG PO TABS: 800.0000 mg | ORAL_TABLET | Freq: Three times a day (TID) | ORAL | Status: DC | PRN

## 2012-11-10 NOTE — ED Provider Notes (Signed)
CSN: [id number]     Arrival date & time 11/10/12  1726 History  This chart was scribed for Benny Lennert, MD, by Yevette Edwards, ED Scribe. This patient was seen in room APA19/[facility]19 and the patient's care was started at 5:38 PM.   First MD Initiated Contact with Patient 11/10/12 1734     Chief Complaint  Patient presents with  . bee stings    Patient is a 72 y.o. female presenting with allergic reaction. The history is provided by the patient. No language interpreter was used.  Allergic Reaction Presenting symptoms: itching and swelling   Presenting symptoms: no difficulty breathing, no difficulty swallowing and no wheezing   Severity:  Mild Prior allergic episodes:  No prior episodes Context: insect bite/sting   Relieved by:  Nothing Ineffective treatments:  Antihistamines  HPI Comments: Patricia Kline is a 72 y.o. female who presents to the Emergency Department complaining of multiple stings from yellow jackets which occurred an hour ago while she was mowing the yard. The pt reports receiving stings to her right hand, right groin, legs bilaterally, and feet bilaterally. She reports she is experiencing pain and swelling to the affected sites. The pt attempted to mitigate her symptoms with 25 mg Benadryl thirty minutes PTA. She denies a h/o similar reactions to bee stings.   Past Medical History  Diagnosis Date  . Breast CA 11/14/2010  . Colon cancer 11/14/2010  . Hypertension   . Osteoporosis   . Hypercholesteremia   . Arthritis   . Peripheral neuropathy   . Depression   . Mitral valve prolapse   . Osteoporosis, senile 03/05/2011  . H/O: hysterectomy   . History of right knee surgery     for bone cancer right knee  . S/P cataract surgery     bil eyes  . Falls   . 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 Cataract extraction, bilateral    . Retinal hole repair      right  . Abdominal  hysterectomy    . Tonsillectomy    . Knee surgery    . Epidural steroid injection with kenalog      Midline  . Port-a-cath removal  02/04/2012    Procedure: REMOVAL PORT-A-CATH;  Surgeon: Fabio Bering, MD;  Location: [facility];  Service: General;  Laterality: N/A;  minor room  . Yag laser application Left 09/23/2012    Procedure: YAG LASER APPLICATION;  Surgeon: Loraine Leriche T. Nile Riggs, MD;  Location: [facility];  Service: Ophthalmology;  Laterality: Left;   Family History  Problem Relation Age of Onset  . Stroke Mother   . Cancer Mother     breast  cancer  . Heart attack Father    History  Substance Use Topics  . Smoking status: Never Smoker   . Smokeless tobacco: Never Used  . Alcohol Use: No   No OB history provided.  Review of Systems  HENT: Negative for trouble swallowing.   Respiratory: Negative for shortness of breath and wheezing.   Skin: Positive for itching and wound (Bee stings).  All other systems reviewed and are negative.    Allergies  Penicillins  Home Medications   Current Outpatient Rx  Name  Route  Sig  Dispense  Refill  . acetaminophen (TYLENOL) 325 MG tablet   Oral   Take 650 mg by mouth as needed.           Marland Kitchen  ALPRAZolam (XANAX) 1 MG tablet   Oral   Take 1 mg by mouth at bedtime as needed.           . citalopram (CELEXA) 20 MG tablet   Oral   Take 20 mg by mouth daily.           Marland Kitchen denosumab (PROLIA) 60 MG/ML SOLN injection   Subcutaneous   Inject 60 mg into the skin every 6 (six) months. Administer in upper arm, thigh, or abdomen         . fish oil-omega-3 fatty acids 1000 MG capsule   Oral   Take 1 g by mouth daily.           Marland Kitchen gabapentin (NEURONTIN) 300 MG capsule   Oral   Take 1 capsule (300 mg total) by mouth 4 (four) times daily.   120 capsule   6   . lisinopril-hydrochlorothiazide (PRINZIDE,ZESTORETIC) 20-12.5 MG per tablet   Oral   Take 1 tablet by mouth daily.           . pravastatin (PRAVACHOL) 10 MG tablet   Oral    Take 10 mg by mouth at bedtime.           Triage Vitals: BP 134/53  Pulse 70  Temp(Src) 98.1 F (36.7 C) (Oral)  Resp 16  Ht 5\' 4"  (1.626 m)  Wt 108 lb (48.988 kg)  BMI 18.53 kg/m2  SpO2 100%  Physical Exam  Nursing note and vitals reviewed. Constitutional: She is oriented to person, place, and time. She appears well-developed. No distress.  HENT:  Head: Normocephalic and atraumatic.  Eyes: Conjunctivae and EOM are normal. No scleral icterus.  Neck: Neck supple. No thyromegaly present.  Cardiovascular: Normal rate, regular rhythm and normal heart sounds.  Exam reveals no gallop and no friction rub.   No murmur heard. Pulmonary/Chest: Effort normal and breath sounds normal. No stridor. No respiratory distress. She has no wheezes. She has no rales. She exhibits no tenderness.  Abdominal: She exhibits no distension. There is no tenderness. There is no rebound.  Musculoskeletal: Normal range of motion. She exhibits no edema.  Lymphadenopathy:    She has no cervical adenopathy.  Neurological: She is alert and oriented to person, place, and time. Coordination normal.  Skin: No rash noted. No erythema.  Insect stings to her legs bilaterally and her hand. About 5 or 6 insect stings total to her extremities.   Psychiatric: She has a normal mood and affect. Her behavior is normal.    ED Course   DIAGNOSTIC STUDIES:  Oxygen Saturation is 100% on room air, normal by my interpretation.    COORDINATION OF CARE:  5:42 PM- Discussed treatment plan with patient, and the patient agreed to the plan.   Procedures (including critical care time)  Labs Reviewed - No data to display No results found. No diagnosis found.  MDM    The chart was scribed for me under my direct supervision.  I personally performed the history, physical, and medical decision making and all procedures in the evaluation of this patient.Benny Lennert, MD 11/10/12 [phone]

## 2012-11-10 NOTE — ED Notes (Signed)
Multiple bee stings from yellow jackets while mowing today.  C/o stings to right hand, right groin, bil feet, and bil legs.  C/o swelling and pain.  Denies allergic reaction in past.  Took Benedryl 25mg  OTC at 1700.

## 2012-11-17 ENCOUNTER — Ambulatory Visit (HOSPITAL_COMMUNITY)
Admission: RE | Admit: 2012-11-17 | Discharge: 2012-11-17 | Disposition: A | Discharge status: Home / Self Care | Payer: Medicare Other | Source: Ambulatory Visit | Attending: Oncology | Admitting: Oncology

## 2012-11-17 DIAGNOSIS — M899 Disorder of bone, unspecified: Secondary | ICD-10-CM | POA: Diagnosis not present

## 2012-11-17 DIAGNOSIS — M810 Age-related osteoporosis without current pathological fracture: Secondary | ICD-10-CM | POA: Diagnosis not present

## 2012-11-17 DIAGNOSIS — C50911 Malignant neoplasm of unspecified site of right female breast: Secondary | ICD-10-CM

## 2012-11-17 DIAGNOSIS — C189 Malignant neoplasm of colon, unspecified: Secondary | ICD-10-CM

## 2012-12-17 DIAGNOSIS — M810 Age-related osteoporosis without current pathological fracture: Secondary | ICD-10-CM | POA: Diagnosis not present

## 2012-12-17 DIAGNOSIS — I10 Essential (primary) hypertension: Secondary | ICD-10-CM | POA: Diagnosis not present

## 2012-12-17 DIAGNOSIS — IMO0002 Reserved for concepts with insufficient information to code with codable children: Secondary | ICD-10-CM | POA: Diagnosis not present

## 2012-12-17 DIAGNOSIS — Z23 Encounter for immunization: Secondary | ICD-10-CM | POA: Diagnosis not present

## 2012-12-17 DIAGNOSIS — D473 Essential (hemorrhagic) thrombocythemia: Secondary | ICD-10-CM | POA: Diagnosis not present

## 2012-12-17 DIAGNOSIS — E785 Hyperlipidemia, unspecified: Secondary | ICD-10-CM | POA: Diagnosis not present

## 2012-12-17 DIAGNOSIS — Z0000 Encounter for general adult medical examination without abnormal findings: Secondary | ICD-10-CM | POA: Diagnosis not present

## 2013-01-11 ENCOUNTER — Encounter (HOSPITAL_COMMUNITY): Payer: Self-pay | Admitting: Oncology

## 2013-01-11 NOTE — Progress Notes (Signed)
Patricia Ribas, MD [address] [place] [address]  Invasive ductal carcinoma of right breast  Adenocarcinoma of sigmoid colon  Osteoporosis, senile - Plan: DG Bone Density  CURRENT THERAPY: Surveillance  INTERVAL HISTORY: Patricia Kline 72 y.o. female returns for  regular  visit for followup of Stage III adenocarcinoma sigmoid colon status post resection on 05/29/2004 for is 6.0 cm cancer with 6 of 15 positive lymph nodes. We gave her oxaliplatin and capecitabine adjuvantly for 6 cycles, thus far without recurrence. Negative colonoscopy in May 2010 and is due for another one in May 2015 according to Dr. Kendell Bane.  AND Stage I (T1 C. N0 M0) invasive ductal carcinoma of the right breast, ER 89% positive, PR 10% positive, HER-2/neu nonamplified, Ki-67 marker 16% status post surgical resection on 12/20/2004 after possibly 4 months of adjuvant Arimidex. The cancer was 2.0 cm pathologically, status post lumpectomy, sentinel node biopsy followed by radiation therapy followed by adjuvant Arimidex for 5 years. She cannot tolerate Arimidex so we switched her to tamoxifen. We had tried letrozole also but that was too costly for her. She therefore had stage II disease clinically at presentation and by MRI, but was reduced to stage I at the time of surgery by the adjuvant hormonal therapy.   I personally reviewed and went over laboratory results with the patient.  Labs from June 2014 shows a mild, yet stable anemia at 11.8 g/dL, WBC of 7.2, and platelet count of 199,000.  Metabolic panel shows a renal insufficiency that is stable.  Additionally, CEA and CA 27.29 are WNL.  I personally reviewed and went over radiographic studies with the patient.  Mammogram in July 2014 shows a negative exam and recommended to be repeated annually. Additionally, bone density examination in August 2014 shows improvement in osteoporosis and therefore we will continue with same osteoporosis  management.   She notes that of late, she has noted B/L ankle edema that worsens as evening progresses, but resolves in the AM after sleeping.  She reports that it is not tender and there is no fluid leaking from skin.  As a result, I provided her education regarding dependent edema.  I decline to treat at this time other than with conservative interventions.  I recommended LE elevation while resting at home.   She recently saw her PCP, Dr. Phillips Odor, who performed her annual physical.  Labs were performed at that time.  She reports that her physical exam was benign and Dr. Phillips Odor was pleased with her Cholesterol.  She denies any issues that were brought up by Dr. Phillips Odor.   [name] is doing well.  From an oncology standpoint, she denies any complaints and ROS questioning is negative.   Past Medical History  Diagnosis Date  . Breast CA 11/14/2010  . Colon cancer 11/14/2010  . Hypertension   . Osteoporosis   . Hypercholesteremia   . Arthritis   . Peripheral neuropathy   . Depression   . Mitral valve prolapse   . Osteoporosis, senile 03/05/2011  . H/O: hysterectomy   . History of right knee surgery     for bone cancer right knee  . S/P cataract surgery     bil eyes  . Falls   . 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 Adenocarcinoma of sigmoid colon 11/14/2010    has Invasive ductal carcinoma of right breast;  Adenocarcinoma of sigmoid colon; Osteoporosis, senile; Arthritis of back; Left leg pain; Spinal stenosis, lumbar; and Peripheral neuropathy on her problem list.     is allergic to penicillins.  [name] had no medications administered during this visit.  Past Surgical History  Procedure Laterality Date  . Cataract extraction, bilateral    . Retinal hole repair      right  . Abdominal hysterectomy    . Tonsillectomy    . Knee surgery    . Epidural steroid injection with kenalog      Midline   . Port-a-cath removal  02/04/2012    Procedure: REMOVAL PORT-A-CATH;  Surgeon: Fabio Bering, MD;  Location: [facility];  Service: General;  Laterality: N/A;  minor room  . Yag laser application Left 09/23/2012    Procedure: YAG LASER APPLICATION;  Surgeon: Loraine Leriche T. Nile Riggs, MD;  Location: [facility];  Service: Ophthalmology;  Laterality: Left;    Denies any headaches, dizziness, double vision, fevers, chills, night sweats, nausea, vomiting, diarrhea, constipation, chest pain, heart palpitations, shortness of breath, blood in stool, black tarry stool, urinary pain, urinary burning, urinary frequency, hematuria.   PHYSICAL EXAMINATION  ECOG PERFORMANCE STATUS: 0 - Asymptomatic  Filed Vitals:   01/12/13 0921  BP: 121/68  Pulse: 51  Temp: 97.3 F (36.3 C)  Resp: 15    GENERAL:alert, no distress, well nourished, well developed, comfortable, cooperative and smiling SKIN: skin color, texture, turgor are normal, no rashes or significant lesions HEAD: Normocephalic, No masses, lesions, tenderness or abnormalities EYES: normal, PERRLA, EOMI, Conjunctiva are pink and non-injected EARS: External ears normal OROPHARYNX:mucous membranes are moist  NECK: supple, no adenopathy, thyroid normal size, non-tender, without nodularity, no stridor, non-tender, trachea midline LYMPH:  no palpable lymphadenopathy, no hepatosplenomegaly BREAST:right breast demonstrates mild surgical changes and radiation, but otherwise breasts appear normal, no suspicious masses, no skin or nipple changes or axillary nodes LUNGS: clear to auscultation and percussion HEART: regular rate & rhythm, no murmurs, no gallops, S1 normal and S2 normal ABDOMEN:abdomen soft, non-tender, normal bowel sounds, no masses or organomegaly and no hepatosplenomegaly BACK: Back symmetric, no curvature., No CVA tenderness EXTREMITIES:less then 2 second capillary refill, no joint deformities, effusion, or inflammation, no skin discoloration, no  clubbing, no cyanosis, positive findings:  edema trace, B/L LE edema  NEURO: alert & oriented x 3 with fluent speech, no focal motor/sensory deficits, gait normal    LABORATORY DATA: CBC    Component Value Date/Time   WBC 7.2 09/12/2012 0955   RBC 3.72* 09/12/2012 0955   HGB 11.8* 09/12/2012 0955   HCT 34.6* 09/12/2012 0955   PLT 199 09/12/2012 0955   MCV 93.0 09/12/2012 0955   MCH 31.7 09/12/2012 0955   MCHC 34.1 09/12/2012 0955   RDW 14.0 09/12/2012 0955   LYMPHSABS 1.3 09/12/2012 0955   MONOABS 0.7 09/12/2012 0955   EOSABS 0.1 09/12/2012 0955   BASOSABS 0.0 09/12/2012 0955      Chemistry      Component Value Date/Time   NA 138 09/12/2012 0955   K 4.1 09/12/2012 0955   CL 99 09/12/2012 0955   CO2 28 09/12/2012 0955   BUN 28* 09/12/2012 0955   CREATININE 1.16* 09/12/2012 0955      Component Value Date/Time   CALCIUM 9.1 09/12/2012 0955   ALKPHOS 44 09/12/2012 0955   AST 15 09/12/2012 0955   ALT 8 09/12/2012 0955   BILITOT 0.4 09/12/2012 0955     Lab Results  Component Value Date  CEA 1.3 09/12/2012   Lab Results  Component Value Date   LABCA2 14 09/12/2012      RADIOGRAPHIC STUDIES:  10/21/2012  *RADIOLOGY REPORT*  Clinical Data: Screening.  DIGITAL SCREENING BILATERAL MAMMOGRAM WITH CAD  Comparison: Previous exam(s).  FINDINGS:  ACR Breast Density Category d: The breasts tissue is extremely  dense, which lowers the sensitivity of mammography.  There are no findings suspicious for malignancy.  Images were processed with CAD.  IMPRESSION:  No mammographic evidence of malignancy.  A result letter of this screening mammogram will be mailed directly  to the patient.  RECOMMENDATION:  Screening mammogram in one year. (Code:SM-B-01Y)  BI-RADS CATEGORY 1: Negative.  Original Report Authenticated By: Annia Belt, M.D     ASSESSMENT:  1. Stage III adenocarcinoma sigmoid colon status post resection on 05/29/2004 for is 6.0 cm cancer with 6 of 15 positive lymph nodes. We gave  her oxaliplatin and capecitabine adjuvantly for 6 cycles, thus far without recurrence. Negative colonoscopy in May 2010 and is due for another one in May 2015 according to Dr. Kendell Bane.  2. Stage I (T1 C. N0 M0) invasive ductal carcinoma of the right breast, ER 89% positive, PR 10% positive, HER-2/neu nonamplified, Ki-67 marker 16% status post surgical resection on 12/20/2004 after possibly 4 months of adjuvant Arimidex. The cancer was 2.0 cm pathologically, status post lumpectomy, sentinel node biopsy followed by radiation therapy followed by adjuvant Arimidex for 5 years. She cannot tolerate Arimidex so we switched her to tamoxifen. We had tried letrozole also but that was too costly for her. She therefore had stage II disease clinically at presentation and by MRI, but was reduced to stage I at the time of surgery by the adjuvant hormonal therapy.  3. 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 She sees Dr. Eduard Clos where she receives epidural steroid injections which helps she states.   4. Mitral valve prolapse  5. Hypertension  6. Peripheral neuropathy  7. Osteoporosis and she had a bone density in August 2014 which stable and slightly improved with Prolia every 6 months, calcium, vitamin D.  She will be due for another bone density in August 2016.  Patient Active Problem List   Diagnosis Date Noted  . Peripheral neuropathy 01/17/2012  . Spinal stenosis, lumbar 05/22/2011  . Left leg pain 03/23/2011  . Arthritis of back 03/21/2011  . Osteoporosis, senile 03/05/2011  . 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 Adenocarcinoma of sigmoid colon 11/14/2010     PLAN:  1. I personally reviewed and went over laboratory results with the patient. 2. I personally reviewed and went over radiographic studies with the patient. 3. Bone density in August 2016.  Order placed. 4. Continue with Prolia every 6 months.  Supportive therapy plan reviewed and update  appropriately.  Last given on 09/12/2012. 5. Continue Ca++ and Vit D. 6. Due for Colonoscopy per Dr. Kendell Bane in May 2015.  WILL REFER PATIENT TO DR. Jena Gauss IN 6 MONTHS FOR COLONOSCOPY. 7. Next screening mammogram is due in July 2015. 8. Labs in December 2014: CBC diff, CMET 9. Patient education regarding dependent LE edema. 10. Return in 6 months for follow-up. At that time we will set-up referral to Dr. Jena Gauss.   THERAPY PLAN:  5 years worth of surveillance are complete from a colon cancer standpoint.  Therefore, NCCN guidelines does not recommend CEA monitoring or surveillance imaging.  As a result, these tests will only be performed as clinically  indicated.  From a breast cancer standpoint, we will continue with breast exams and perform annual screening mammograms. From an osteoporosis standpoint, we will continue with Prolia every 6 months as her most recent bone scan is improved.  She is encouraged to continue Ca++ and Vit D supplementation.  All questions were answered. The patient knows to call the clinic with any problems, questions or concerns. We can certainly see the patient much sooner if necessary.  Patient and plan discussed with Dr. Alla German and he is in agreement with the aforementioned.   KEFALAS,THOMAS

## 2013-01-12 ENCOUNTER — Encounter (HOSPITAL_COMMUNITY): Payer: Self-pay | Admitting: Oncology

## 2013-01-12 ENCOUNTER — Encounter (HOSPITAL_COMMUNITY): Payer: Medicare Other | Attending: Oncology | Admitting: Oncology

## 2013-01-12 VITALS — BP 121/68 | HR 51 | Temp 97.3°F | Resp 15

## 2013-01-12 DIAGNOSIS — M810 Age-related osteoporosis without current pathological fracture: Secondary | ICD-10-CM

## 2013-01-12 DIAGNOSIS — C50911 Malignant neoplasm of unspecified site of right female breast: Secondary | ICD-10-CM

## 2013-01-12 DIAGNOSIS — C187 Malignant neoplasm of sigmoid colon: Secondary | ICD-10-CM

## 2013-01-12 DIAGNOSIS — Z853 Personal history of malignant neoplasm of breast: Secondary | ICD-10-CM | POA: Diagnosis not present

## 2013-01-12 DIAGNOSIS — Z85038 Personal history of other malignant neoplasm of large intestine: Secondary | ICD-10-CM

## 2013-01-12 DIAGNOSIS — G609 Hereditary and idiopathic neuropathy, unspecified: Secondary | ICD-10-CM

## 2013-01-12 NOTE — Patient Instructions (Signed)
[facility] Discharge Instructions  RECOMMENDATIONS MADE BY THE CONSULTANT AND ANY TEST RESULTS WILL BE SENT TO YOUR REFERRING PHYSICIAN.  EXAM FINDINGS BY THE PHYSICIAN TODAY AND SIGNS OR SYMPTOMS TO REPORT TO CLINIC OR PRIMARY PHYSICIAN: Exam and findings as discussed by Dellis Anes, PA - C.  You are doing well.  Report changes in your bowels, blood in your bowel movements, any new lumps, shortness of breath or other problems.  MEDICATIONS PRESCRIBED:  none  INSTRUCTIONS/FOLLOW-UP: Blood work and prolia in December and follow-up in 6 months.  Thank you for choosing [facility] to provide your oncology and hematology care.  To afford each patient quality time with our providers, please arrive at least 15 minutes before your scheduled appointment time.  With your help, our goal is to use those 15 minutes to complete the necessary work-up to ensure our physicians have the information they need to help with your evaluation and healthcare recommendations.    Effective January 1st, 2014, we ask that you re-schedule your appointment with our physicians should you arrive 10 or more minutes late for your appointment.  We strive to give you quality time with our providers, and arriving late affects you and other patients whose appointments are after yours.    Again, thank you for choosing [facility].  Our hope is that these requests will decrease the amount of time that you wait before being seen by our physicians.       _____________________________________________________________  Should you have questions after your visit to [facility], please contact our office at [phone] between the hours of 8:30 a.m. and 5:00 p.m.  Voicemails left after 4:30 p.m. will not be returned until the following business day.  For prescription refill requests, have your pharmacy contact our office with your prescription refill request.

## 2013-03-02 DIAGNOSIS — H35349 Macular cyst, hole, or pseudohole, unspecified eye: Secondary | ICD-10-CM | POA: Diagnosis not present

## 2013-03-02 DIAGNOSIS — H04129 Dry eye syndrome of unspecified lacrimal gland: Secondary | ICD-10-CM | POA: Diagnosis not present

## 2013-03-02 DIAGNOSIS — H31009 Unspecified chorioretinal scars, unspecified eye: Secondary | ICD-10-CM | POA: Diagnosis not present

## 2013-03-02 DIAGNOSIS — H35359 Cystoid macular degeneration, unspecified eye: Secondary | ICD-10-CM | POA: Diagnosis not present

## 2013-03-13 ENCOUNTER — Encounter (HOSPITAL_COMMUNITY): Payer: Medicare Other | Attending: Oncology

## 2013-03-13 ENCOUNTER — Encounter (HOSPITAL_BASED_OUTPATIENT_CLINIC_OR_DEPARTMENT_OTHER): Payer: Medicare Other

## 2013-03-13 VITALS — BP 134/45 | HR 49 | Temp 97.3°F | Resp 16

## 2013-03-13 DIAGNOSIS — M810 Age-related osteoporosis without current pathological fracture: Secondary | ICD-10-CM | POA: Diagnosis not present

## 2013-03-13 DIAGNOSIS — Z853 Personal history of malignant neoplasm of breast: Secondary | ICD-10-CM | POA: Diagnosis not present

## 2013-03-13 DIAGNOSIS — C189 Malignant neoplasm of colon, unspecified: Secondary | ICD-10-CM

## 2013-03-13 DIAGNOSIS — C50919 Malignant neoplasm of unspecified site of unspecified female breast: Secondary | ICD-10-CM | POA: Insufficient documentation

## 2013-03-13 LAB — CBC WITH DIFFERENTIAL/PLATELET
Eosinophils Absolute: 0.1 10*3/uL (ref 0.0–0.7)
Eosinophils Relative: 3 % (ref 0–5)
HCT: 38.4 % (ref 36.0–46.0)
Hemoglobin: 12.5 g/dL (ref 12.0–15.0)
Lymphocytes Relative: 27 % (ref 12–46)
Lymphs Abs: 1.3 10*3/uL (ref 0.7–4.0)
MCH: 30.4 pg (ref 26.0–34.0)
MCV: 93.4 fL (ref 78.0–100.0)
Monocytes Relative: 13 % — ABNORMAL HIGH (ref 3–12)
Platelets: 155 10*3/uL (ref 150–400)
RBC: 4.11 MIL/uL (ref 3.87–5.11)
WBC: 4.8 10*3/uL (ref 4.0–10.5)

## 2013-03-13 LAB — COMPREHENSIVE METABOLIC PANEL
ALT: 7 U/L (ref 0–35)
Alkaline Phosphatase: 39 U/L (ref 39–117)
BUN: 17 mg/dL (ref 6–23)
CO2: 28 mEq/L (ref 19–32)
Calcium: 9.2 mg/dL (ref 8.4–10.5)
GFR calc Af Amer: 61 mL/min — ABNORMAL LOW (ref >90)
GFR calc non Af Amer: 53 mL/min — ABNORMAL LOW (ref >90)
Glucose, Bld: 77 mg/dL (ref 70–99)
Sodium: 140 mEq/L (ref 135–145)
Total Protein: 7 g/dL (ref 6.0–8.3)

## 2013-03-13 MED ORDER — DENOSUMAB 60 MG/ML ~~LOC~~ SOLN
60.0000 mg | Freq: Once | SUBCUTANEOUS | Status: AC
  Administered 2013-03-13: 60 mg via SUBCUTANEOUS
  Filled 2013-03-13: qty 1

## 2013-03-13 NOTE — Progress Notes (Signed)
Patricia Kline presents today for injection per MD orders. Prolia 60 mg administered SQ in left Abdomen. Administration without incident. Patient tolerated well.

## 2013-03-13 NOTE — Progress Notes (Signed)
Labs drawn today for cbc/diff,cmp 

## 2013-07-12 NOTE — Progress Notes (Signed)
[name], MD [address] [place] [address]  Invasive ductal carcinoma of right breast  Adenocarcinoma of sigmoid colon  Osteoporosis, senile  Peripheral neuropathy  CURRENT THERAPY: Surveillance per NCCN guidelines and Prolia 60 mg every 6 months (last given on 03/13/2013)  INTERVAL HISTORY: Patricia Kline 73 y.o. female returns for  regular  visit for followup of Stage III adenocarcinoma sigmoid colon status post resection on 05/29/2004 for is 6.0 cm cancer with 6 of 15 positive lymph nodes. We gave her oxaliplatin and capecitabine adjuvantly for 6 cycles, thus far without recurrence. Negative colonoscopy in May 2010 and is due for another one in May 2015 according to Dr. Sydell Axon.  AND Stage I (T1 C. N0 M0) invasive ductal carcinoma of the right breast, ER 89% positive, PR 10% positive, HER-2/neu nonamplified, Ki-67 marker 16% status post surgical resection on 12/20/2004 after possibly 4 months of adjuvant Arimidex. The cancer was 2.0 cm pathologically, status post lumpectomy, sentinel node biopsy followed by radiation therapy followed by adjuvant Arimidex for 5 years. She cannot tolerate Arimidex so we switched her to tamoxifen. We had tried letrozole also but that was too costly for her. She therefore had stage II disease clinically at presentation and by MRI, but was reduced to stage I at the time of surgery by the adjuvant hormonal therapy.    Invasive ductal carcinoma of right breast   07/19/2004 Imaging PET- increased uptake in posterior aspect of right breast   08/09/2004 Imaging Mammogram and [place]   08/09/2004 Initial Diagnosis Invasive ductal carcinoma of right breast on needle core biopsy of right breast   08/22/2004 Imaging MRI B/L Breast- 1.8 x 1.4 x 1.3 cm right breast mass   08/29/2004 - 12/13/2004 Chemotherapy Arimidex x 4 months neoadjuvantly- 77% shrinkage by volume size   12/20/2004 Surgery Right partial mastectomy- 2 cm, ER 89%, PR 10%,  Her2 negative, Ki-67 16%.     02/06/2005 - 03/28/2005 Radiation Therapy By Dr. Elba Barman   05/15/2005 - 05/06/2007 Chemotherapy Femara   08/29/2005 Imaging Mammogram s/p chemo and resection.  BIRADS 2   05/06/2007 - 09/06/2009 Chemotherapy Tamoxifen    Adenocarcinoma of sigmoid colon   05/29/2004 Initial Diagnosis Adenocarcinoma of sigmoid colon   05/29/2004 Surgery Segmental resection, 6 cm maximum size, adenocarcinoma, 6/15 lymph nodes positive for disease, T3N2   07/25/2004 - 11/15/2004 Chemotherapy Xeloda + Oxaliplatin, Avastin x 6 cycles.  Avastin d/c after cycle 1    Remission     I personally reviewed and went over laboratory results with the patient.  The results are noted within this dictation.  I personally reviewed and went over radiographic studies with the patient.  The results are noted within this dictation.    Her mammogram in July was negative and she will be due for her next this July.  She is having some family issues, namely her mother is ailing and Roisin's family is expecting her to move her mother in with her, but in my opinion, Dawanna cannot take care of herself and her mother.  We briefly discussed nursing home placement.  Shatavia notes that her mother's PCP has not yet mentioned nursing home placement.   As a result of this stress, she asked about her anxiolytic and anti-depressant therapy.  I recommended that she follow-up with her PCP regarding this as she may benefit from a short-term increase in both medications.   Oncologically, she denies any complaints and ROS questioning is negative.  She is due for her next screening colonoscopy by Dr. Gala Romney this May 2015.  Past Medical History  Diagnosis Date  . Breast CA 11/14/2010  . Colon cancer 11/14/2010  . Hypertension   . Osteoporosis   . Hypercholesteremia   . Arthritis   . Peripheral neuropathy   . Depression   . Mitral valve prolapse   . Osteoporosis, senile 03/05/2011  . H/O: hysterectomy   . History of right knee  surgery     for bone cancer right knee  . S/P cataract surgery     bil eyes  . Falls   . 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 Adenocarcinoma of sigmoid colon 11/14/2010    has Invasive ductal carcinoma of right breast; Adenocarcinoma of sigmoid colon; Osteoporosis, senile; Arthritis of back; Left leg pain; Spinal stenosis, lumbar; and Peripheral neuropathy on her problem list.     is allergic to penicillins.  Ms. Steinhaus does not currently have medications on file.  Past Surgical History  Procedure Laterality Date  . Cataract extraction, bilateral    . Retinal hole repair      right  . Abdominal hysterectomy    . Tonsillectomy    . Knee surgery    . Epidural steroid injection with kenalog      Midline  . Port-a-cath removal  02/04/2012    Procedure: REMOVAL PORT-A-CATH;  Surgeon: Donato Heinz, MD;  Location: [facility];  Service: General;  Laterality: N/A;  minor room  . Yag laser application Left 08/24/8183    Procedure: YAG LASER APPLICATION;  Surgeon: Elta Guadeloupe T. Gershon Crane, MD;  Location: [facility];  Service: Ophthalmology;  Laterality: Left;    Denies any headaches, dizziness, double vision, fevers, chills, night sweats, nausea, vomiting, diarrhea, constipation, chest pain, heart palpitations, shortness of breath, blood in stool, black tarry stool, urinary pain, urinary burning, urinary frequency, hematuria.   PHYSICAL EXAMINATION  ECOG PERFORMANCE STATUS: 0 - Asymptomatic  Filed Vitals:   07/13/13 1400  BP: 118/70  Pulse: 60  Temp: 97.5 F (36.4 C)  Resp: 18    GENERAL:alert, healthy, no distress, well nourished, well developed, comfortable, cooperative and smiling SKIN: skin color, texture, turgor are normal, no rashes or significant lesions HEAD: Normocephalic, No masses, lesions, tenderness or abnormalities EYES: normal, PERRLA, EOMI, Conjunctiva are pink and non-injected EARS:  External ears normal OROPHARYNX:mucous membranes are moist  NECK: supple, no adenopathy, thyroid normal size, non-tender, without nodularity, no stridor, non-tender, trachea midline LYMPH:  no palpable lymphadenopathy, no hepatosplenomegaly BREAST:breasts appear normal, no suspicious masses, no skin or nipple changes or axillary nodes LUNGS: clear to auscultation and percussion HEART: regular rate & rhythm, no murmurs, no gallops, S1 normal and S2 normal ABDOMEN:abdomen soft, non-tender, normal bowel sounds, no masses or organomegaly and no hepatosplenomegaly BACK: Back symmetric, no curvature., No CVA tenderness EXTREMITIES:less then 2 second capillary refill, no joint deformities, effusion, or inflammation, no edema, no skin discoloration, no clubbing, no cyanosis  NEURO: alert & oriented x 3 with fluent speech, no focal motor/sensory deficits, gait normal    LABORATORY DATA: Lab Results  Component Value Date   WBC 4.8 03/13/2013   HGB 12.5 03/13/2013   HCT 38.4 03/13/2013   MCV 93.4 03/13/2013   PLT 155 03/13/2013     Chemistry      Component Value Date/Time   NA 140 03/13/2013 0941   K 4.0 03/13/2013 0941  CL 102 03/13/2013 0941   CO2 28 03/13/2013 0941   BUN 17 03/13/2013 0941   CREATININE 1.04 03/13/2013 0941      Component Value Date/Time   CALCIUM 9.2 03/13/2013 0941   ALKPHOS 39 03/13/2013 0941   AST 15 03/13/2013 0941   ALT 7 03/13/2013 0941   BILITOT 0.4 03/13/2013 0941     Lab Results  Component Value Date   CEA 1.3 09/12/2012   Lab Results  Component Value Date   LABCA2 14 09/12/2012      ASSESSMENT:  1. Stage III adenocarcinoma sigmoid colon status post resection on 05/29/2004 for is 6.0 cm cancer with 6 of 15 positive lymph nodes. We gave her oxaliplatin and capecitabine adjuvantly for 6 cycles, thus far without recurrence. Negative colonoscopy in May 2010 and is due for another one in May 2015 according to Dr. Sydell Axon.  2. Stage I (T1 C. N0 M0)  invasive ductal carcinoma of the right breast, ER 89% positive, PR 10% positive, HER-2/neu nonamplified, Ki-67 marker 16% status post surgical resection on 12/20/2004 after possibly 4 months of adjuvant Arimidex. The cancer was 2.0 cm pathologically, status post lumpectomy, sentinel node biopsy followed by radiation therapy followed by adjuvant Arimidex for 5 years. She cannot tolerate Arimidex so we switched her to tamoxifen. We had tried letrozole also but that was too costly for her. She therefore had stage II disease clinically at presentation and by MRI, but was reduced to stage I at the time of surgery by the adjuvant hormonal therapy.  3. 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 She sees Dr. Ace Gins where she receives epidural steroid injections which helps she states.  4. Mitral valve prolapse  5. Hypertension  6. Peripheral neuropathy  7. Osteoporosis and she had a bone density in August 2014 which stable and slightly improved with Prolia every 6 months, calcium, vitamin D. She will be due for another bone density in August 2016.  Patient Active Problem List   Diagnosis Date Noted  . Peripheral neuropathy 01/17/2012  . Spinal stenosis, lumbar 05/22/2011  . Left leg pain 03/23/2011  . Arthritis of back 03/21/2011  . Osteoporosis, senile 03/05/2011  . 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 Adenocarcinoma of sigmoid colon 11/14/2010     PLAN:  1. I personally reviewed and went over laboratory results with the patient.  The results are noted within this dictation. 2. Oncology history plan abstracted 3. Next Bone density in August 2016. Order placed 4. Continue with Prolia every 6 months. Supportive therapy plan reviewed and update appropriately. Last given on 03/13/2013. 5. Continue Ca++ and Vit D. 6. Due for Colonoscopy per Dr. Sydell Axon in May 2015. WILL REFER PATIENT TO DR. Gala Romney  7. Next screening mammogram is due in July 2015. 8. Labs  in June 2015: CBC diff, CMET, CEA, CA 27.29 9. Return in 6 months for follow-up.   THERAPY PLAN:  5 years worth of surveillance are complete from a colon cancer standpoint. Therefore, NCCN guidelines does not recommend CEA monitoring or surveillance imaging.  From a breast cancer standpoint, we will continue with breast exams and perform annual screening mammograms. From an osteoporosis standpoint, we will continue with Prolia every 6 months as her most recent bone scan is improved. She is encouraged to continue Ca++ and Vit D supplementation.   All questions were answered. The patient knows to call the clinic with any problems, questions or concerns. We can certainly see  the patient much sooner if necessary.  Patient and plan discussed with Dr. Farrel Gobble and he is in agreement with the aforementioned.   [facility] 07/13/2013

## 2013-07-13 ENCOUNTER — Encounter (HOSPITAL_COMMUNITY): Payer: Self-pay | Admitting: Oncology

## 2013-07-13 ENCOUNTER — Encounter (HOSPITAL_COMMUNITY): Payer: Medicare Other | Attending: Oncology | Admitting: Oncology

## 2013-07-13 ENCOUNTER — Ambulatory Visit (HOSPITAL_COMMUNITY): Payer: Medicare Other | Admitting: Oncology

## 2013-07-13 VITALS — BP 118/70 | HR 60 | Temp 97.5°F | Resp 18 | Wt 117.0 lb

## 2013-07-13 DIAGNOSIS — G609 Hereditary and idiopathic neuropathy, unspecified: Secondary | ICD-10-CM

## 2013-07-13 DIAGNOSIS — C187 Malignant neoplasm of sigmoid colon: Secondary | ICD-10-CM

## 2013-07-13 DIAGNOSIS — M810 Age-related osteoporosis without current pathological fracture: Secondary | ICD-10-CM | POA: Diagnosis not present

## 2013-07-13 DIAGNOSIS — C50919 Malignant neoplasm of unspecified site of unspecified female breast: Secondary | ICD-10-CM

## 2013-07-13 DIAGNOSIS — I10 Essential (primary) hypertension: Secondary | ICD-10-CM

## 2013-07-13 DIAGNOSIS — C50911 Malignant neoplasm of unspecified site of right female breast: Secondary | ICD-10-CM

## 2013-07-13 DIAGNOSIS — G629 Polyneuropathy, unspecified: Secondary | ICD-10-CM

## 2013-07-13 NOTE — Patient Instructions (Signed)
[facility] Discharge Instructions  RECOMMENDATIONS MADE BY THE CONSULTANT AND ANY TEST RESULTS WILL BE SENT TO YOUR REFERRING PHYSICIAN.  EXAM FINDINGS BY THE PHYSICIAN TODAY AND SIGNS OR SYMPTOMS TO REPORT TO CLINIC OR PRIMARY PHYSICIAN:   Refer to Dr. Gala Romney for colonoscopy. Mazzie to call his office.   Due for Prolia & labs on Tuesday September 08, 2013 @ 10:20  See Gershon Mussel in 6 months  Tuesday 01/12/14 @ 2:30  Phone # is [phone]  Thank you for choosing [facility] to provide your oncology and hematology care.  To afford each patient quality time with our providers, please arrive at least 15 minutes before your scheduled appointment time.  With your help, our goal is to use those 15 minutes to complete the necessary work-up to ensure our physicians have the information they need to help with your evaluation and healthcare recommendations.    Effective January 1st, 2014, we ask that you re-schedule your appointment with our physicians should you arrive 10 or more minutes late for your appointment.  We strive to give you quality time with our providers, and arriving late affects you and other patients whose appointments are after yours.    Again, thank you for choosing [facility].  Our hope is that these requests will decrease the amount of time that you wait before being seen by our physicians.       _____________________________________________________________  Should you have questions after your visit to [facility], please contact our office at [phone] between the hours of 8:30 a.m. and 5:00 p.m.  Voicemails left after 4:30 p.m. will not be returned until the following business day.  For prescription refill requests, have your pharmacy contact our office with your prescription refill request.

## 2013-07-20 ENCOUNTER — Telehealth: Payer: Self-pay

## 2013-07-20 NOTE — Telephone Encounter (Signed)
Pt is scheduled for OV with Laban Emperor, NP on 08/25/2013 at 10:00 Am.

## 2013-07-20 NOTE — Telephone Encounter (Signed)
Pt was referred from Oncology for colonoscopy. She has a hx of colon cancer. Her last one was 08/27/2013 with Dr. Gala Romney and her next was recommended in 5 years. I called and LMOM for a return call she does need to schedule OV prior to colonoscopy.

## 2013-08-25 ENCOUNTER — Encounter (INDEPENDENT_AMBULATORY_CARE_PROVIDER_SITE_OTHER): Payer: Self-pay

## 2013-08-25 ENCOUNTER — Encounter: Payer: Self-pay | Admitting: Gastroenterology

## 2013-08-25 ENCOUNTER — Ambulatory Visit (INDEPENDENT_AMBULATORY_CARE_PROVIDER_SITE_OTHER): Payer: Medicare Other | Admitting: Gastroenterology

## 2013-08-25 VITALS — BP 125/56 | HR 56 | Temp 98.4°F | Ht 64.0 in | Wt 112.8 lb

## 2013-08-25 DIAGNOSIS — C187 Malignant neoplasm of sigmoid colon: Secondary | ICD-10-CM

## 2013-08-25 MED ORDER — PEG 3350-KCL-NA BICARB-NACL 420 G PO SOLR: 4000.0000 mL | ORAL | Status: DC

## 2013-08-25 NOTE — Patient Instructions (Signed)
We have set you up for a colonoscopy with Dr. Gala Romney in the near future.  Start taking a probiotic daily such as Digestive Advantage, Philip's Colon Health, [employer] brand, Align, or Restora.

## 2013-08-25 NOTE — Progress Notes (Signed)
Primary Care Physician:  [name], MD Primary Gastroenterologist:  Dr. Gala Romney  Chief Complaint  Patient presents with  . Colonoscopy    HPI:   Patricia Kline presents today for surveillance colonoscopy. She has a history of colon cancer in 2006, stat post LAR. Last colonoscopy in 2010 unimpressive. Needs surveillance. No rectal bleeding. Denies constipation, diarrhea. Has a lot of gas. No significant abdominal pain. BM twice a day. Eats raisin bran every morning. Once a day has vegetables. No reflux, dysphagia.   Past Medical History  Diagnosis Date  . Breast CA 11/14/2010  . Colon cancer 11/14/2010  . Hypertension   . Osteoporosis   . Hypercholesteremia   . Arthritis   . Peripheral neuropathy   . Depression   . Mitral valve prolapse   . Osteoporosis, senile 03/05/2011  . H/O: hysterectomy   . History of right knee surgery     for bone cancer right knee  . S/P cataract surgery     bil eyes  . Falls   . 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 Cataract extraction, bilateral    . Retinal hole repair      bilateral  . Abdominal hysterectomy    . Tonsillectomy    . Knee surgery    . Epidural steroid injection with kenalog      Midline  . Port-a-cath removal  02/04/2012    Procedure: REMOVAL PORT-A-CATH;  Surgeon: Donato Heinz, MD;  Location: [facility];  Service: General;  Laterality: N/A;  minor room  . Yag laser application Left 09/23/7626    Procedure: YAG LASER APPLICATION;  Surgeon: Elta Guadeloupe T. Gershon Crane, MD;  Location: [facility];  Service: Ophthalmology;  Laterality: Left;  . Colonoscopy  08/27/2008    BTD:VVOH papilla.  Surgical anastomosis at 8-10 cm from the anal verge.  Residual rectal and colonic mucosa appeared normal.    Current Outpatient Prescriptions  Medication Sig  Dispense Refill  . acetaminophen (TYLENOL) 325 MG tablet Take 650 mg by mouth as needed.        . ALPRAZolam (XANAX) 1 MG tablet Take 1 mg by mouth at bedtime as needed.        . citalopram (CELEXA) 20 MG tablet Take 20 mg by mouth daily.        Marland Kitchen denosumab (PROLIA) 60 MG/ML SOLN injection Inject 60 mg into the skin every 6 (six) months. Administer in upper arm, thigh, or abdomen      . fish oil-omega-3 fatty acids 1000 MG capsule Take 1 g by mouth daily.        Marland Kitchen gabapentin (NEURONTIN) 300 MG capsule Take 1 capsule (300 mg total) by mouth 4 (four) times daily.  120 capsule  6  . lisinopril-hydrochlorothiazide (PRINZIDE,ZESTORETIC) 20-12.5 MG per tablet Take 1 tablet by mouth daily.        . pravastatin (PRAVACHOL) 10 MG tablet Take 10 mg by mouth at bedtime.        No current facility-administered medications for this visit.    Allergies as of 08/25/2013 - Review Complete 08/25/2013  Allergen Reaction Noted  . Penicillins  11/22/2010    Family History  Problem Relation Age of Onset  . Stroke Mother   . Cancer Mother     breast  cancer  .  Heart attack Father   . Colon cancer Neg Hx     History   Social History  . Marital Status: Married    Spouse Name: N/A    Number of Children: N/A  . Years of Education: N/A   Occupational History  . Not on file.   Social History Main Topics  . Smoking status: Never Smoker   . Smokeless tobacco: Never Used  . Alcohol Use: No  . Drug Use: No  . Sexual Activity: No   Other Topics Concern  . Not on file   Social History Narrative  . No narrative on file    Review of Systems: Gen: Denies any fever, chills, fatigue, weight loss, lack of appetite.  CV: palpitations "sometimes"  Resp: Denies shortness of breath at rest or with exertion. Denies wheezing or cough.  GI: see HPI GU : Denies urinary burning, urinary frequency, urinary hesitancy MS: +arthritis Derm: Denies rash, itching, dry skin Psych: short term memory loss after  chemo Heme: Denies bruising, bleeding, and enlarged lymph nodes.  Physical Exam: BP 125/56  Pulse 56  Temp(Src) 98.4 F (36.9 C) (Oral)  Ht 5\' 4"  (1.626 m)  Wt 112 lb 12.8 oz (51.166 kg)  BMI 19.35 kg/m2 General:   Alert and oriented. Pleasant and cooperative. Well-nourished and well-developed.  Head:  Normocephalic and atraumatic. Eyes:  Without icterus, sclera clear and conjunctiva pink.  Ears:  Normal auditory acuity. Nose:  No deformity, discharge,  or lesions. Mouth:  No deformity or lesions, oral mucosa pink.  Neck:  Supple, without mass or thyromegaly. Lungs:  Clear to auscultation bilaterally. No wheezes, rales, or rhonchi. No distress.  Heart:  S1, S2 present without murmurs appreciated.  Abdomen:  +BS, soft, non-tender and non-distended. No HSM noted. No guarding or rebound. No masses appreciated.  Rectal:  Deferred  Msk:  Symmetrical without gross deformities. Normal posture. Extremities:  Without clubbing or edema. Neurologic:  Alert and  oriented x4;  grossly normal neurologically. Skin:  Intact without significant lesions or rashes. Psych:  Alert and cooperative. Normal mood and affect.  Lab Results  Component Value Date   WBC 4.8 03/13/2013   HGB 12.5 03/13/2013   HCT 38.4 03/13/2013   MCV 93.4 03/13/2013   PLT 155 03/13/2013   Lab Results  Component Value Date   ALT 7 03/13/2013   AST 15 03/13/2013   ALKPHOS 39 03/13/2013   BILITOT 0.4 03/13/2013   Lab Results  Component Value Date   CREATININE 1.04 03/13/2013   BUN 17 03/13/2013   NA 140 03/13/2013   K 4.0 03/13/2013   CL 102 03/13/2013   CO2 28 03/13/2013

## 2013-08-27 ENCOUNTER — Encounter: Payer: Self-pay | Admitting: Gastroenterology

## 2013-08-27 NOTE — Assessment & Plan Note (Signed)
73 year old female with history of colon cancer in remote past, due for routine surveillance now. Last surveillance normal in 2010. No significant lower GI or upper GI symptoms. Intermittent gas but no other concerning features. Start a probiotic daily.   Proceed with TCS with Dr. Gala Romney in near future: the risks, benefits, and alternatives have been discussed with the patient in detail. The patient states understanding and desires to proceed.

## 2013-08-28 ENCOUNTER — Encounter (HOSPITAL_COMMUNITY): Payer: Self-pay | Admitting: Pharmacy Technician

## 2013-08-31 ENCOUNTER — Encounter (HOSPITAL_COMMUNITY)
Admission: RE | Disposition: A | Discharge status: Home / Self Care | Payer: Self-pay | Source: Ambulatory Visit | Attending: Internal Medicine

## 2013-08-31 ENCOUNTER — Encounter (HOSPITAL_COMMUNITY): Payer: Self-pay | Admitting: *Deleted

## 2013-08-31 ENCOUNTER — Ambulatory Visit (HOSPITAL_COMMUNITY)
Admission: RE | Admit: 2013-08-31 | Discharge: 2013-08-31 | Disposition: A | Discharge status: Home / Self Care | Payer: Medicare Other | Source: Ambulatory Visit | Attending: Internal Medicine | Admitting: Internal Medicine

## 2013-08-31 DIAGNOSIS — I10 Essential (primary) hypertension: Secondary | ICD-10-CM | POA: Insufficient documentation

## 2013-08-31 DIAGNOSIS — Z85830 Personal history of malignant neoplasm of bone: Secondary | ICD-10-CM | POA: Insufficient documentation

## 2013-08-31 DIAGNOSIS — Z1211 Encounter for screening for malignant neoplasm of colon: Secondary | ICD-10-CM | POA: Diagnosis not present

## 2013-08-31 DIAGNOSIS — F329 Major depressive disorder, single episode, unspecified: Secondary | ICD-10-CM | POA: Diagnosis not present

## 2013-08-31 DIAGNOSIS — Z853 Personal history of malignant neoplasm of breast: Secondary | ICD-10-CM | POA: Insufficient documentation

## 2013-08-31 DIAGNOSIS — F3289 Other specified depressive episodes: Secondary | ICD-10-CM | POA: Diagnosis not present

## 2013-08-31 DIAGNOSIS — E7800 Pure hypercholesterolemia, unspecified: Secondary | ICD-10-CM | POA: Diagnosis not present

## 2013-08-31 DIAGNOSIS — C187 Malignant neoplasm of sigmoid colon: Secondary | ICD-10-CM

## 2013-08-31 DIAGNOSIS — Z85038 Personal history of other malignant neoplasm of large intestine: Secondary | ICD-10-CM

## 2013-08-31 HISTORY — PX: COLONOSCOPY: SHX5424

## 2013-08-31 SURGERY — COLONOSCOPY
Anesthesia: Moderate Sedation

## 2013-08-31 MED ORDER — MIDAZOLAM HCL 5 MG/5ML IJ SOLN
INTRAMUSCULAR | Status: DC | PRN
  Administered 2013-08-31 (×2): 2 mg via INTRAVENOUS

## 2013-08-31 MED ORDER — STERILE WATER FOR IRRIGATION IR SOLN
Status: DC | PRN
  Administered 2013-08-31: 15:00:00

## 2013-08-31 MED ORDER — ONDANSETRON HCL 4 MG/2ML IJ SOLN
INTRAMUSCULAR | Status: AC
  Filled 2013-08-31: qty 2

## 2013-08-31 MED ORDER — MEPERIDINE HCL 100 MG/ML IJ SOLN
INTRAMUSCULAR | Status: DC | PRN
  Administered 2013-08-31: 25 mg via INTRAVENOUS
  Administered 2013-08-31: 50 mg via INTRAVENOUS

## 2013-08-31 MED ORDER — MIDAZOLAM HCL 5 MG/5ML IJ SOLN
INTRAMUSCULAR | Status: AC
  Filled 2013-08-31: qty 10

## 2013-08-31 MED ORDER — SODIUM CHLORIDE 0.9 % IV SOLN
INTRAVENOUS | Status: DC
  Administered 2013-08-31: 14:00:00 via INTRAVENOUS

## 2013-08-31 MED ORDER — ONDANSETRON HCL 4 MG/2ML IJ SOLN
INTRAMUSCULAR | Status: DC | PRN
  Administered 2013-08-31: 4 mg via INTRAVENOUS

## 2013-08-31 MED ORDER — MEPERIDINE HCL 100 MG/ML IJ SOLN
INTRAMUSCULAR | Status: AC
  Filled 2013-08-31: qty 2

## 2013-08-31 NOTE — Progress Notes (Signed)
cc'd to pcp 

## 2013-08-31 NOTE — Op Note (Signed)
[place], [id number]   COLONOSCOPY PROCEDURE REPORT  PATIENT: Patricia Kline, Patricia Kline.  MR#:         [id number] BIRTHDATE: 01/18/1941 , 72  yrs. old GENDER: Female ENDOSCOPIST: R.  Garfield Cornea, MD FACP FACG REFERRED BY:  Sharilyn Sites, M.D.  Robynn Pane, P.A. PROCEDURE DATE:  08/31/2013 PROCEDURE:     Surveillance colonoscopy  INDICATIONS: History of colorectal cancer.  INFORMED CONSENT:  The risks, benefits, alternatives and imponderables including but not limited to bleeding, perforation as well as the possibility of a missed lesion have been reviewed.  The potential for biopsy, lesion removal, etc. have also been discussed.  Questions have been answered.  All parties agreeable. Please see the history and physical in the medical record for more information.  MEDICATIONS: Versed 4 mg IV and Demerol 75 mg IV in divided doses. Zofran 4 mg IV.  DESCRIPTION OF PROCEDURE:  After a digital rectal exam was performed, the EC-[id number] ([id number])  colonoscope was advanced from the anus through the rectum and colon to the area of the cecum, ileocecal valve and appendiceal orifice.  The cecum was deeply intubated.  These structures were well-seen and photographed for the record.  From the level of the cecum and ileocecal valve, the scope was slowly and cautiously withdrawn.  The mucosal surfaces were carefully surveyed utilizing scope tip deflection to facilitate fold flattening as needed.  The scope was pulled down into the rectum where a thorough examination was performed.    FINDINGS:  Adequate preparation. Surgical anastomosis at 15 cm verge. Residual rectum small-unable retroflex but was seen well on-face. The residual colonic mucosa appeared normal.  THERAPEUTIC / DIAGNOSTIC MANEUVERS PERFORMED:  None  COMPLICATIONS: none  CECAL WITHDRAWAL TIME:  6 minutes  IMPRESSION:  Normal residual rectum/colon  RECOMMENDATIONS: One more surveillance  colonoscopy in 5 years   _______________________________ eSigned:  R. Garfield Cornea, MD FACP [facility] 08/31/2013 3:11 PM   CC:

## 2013-08-31 NOTE — Discharge Instructions (Addendum)

## 2013-08-31 NOTE — H&P (View-Only) (Signed)
Primary Care Physician:  [name], MD Primary Gastroenterologist:  Dr. Gala Romney  Chief Complaint  Patient presents with  . Colonoscopy    HPI:   Patricia Kline presents today for surveillance colonoscopy. She has a history of colon cancer in 2006, stat post LAR. Last colonoscopy in 2010 unimpressive. Needs surveillance. No rectal bleeding. Denies constipation, diarrhea. Has a lot of gas. No significant abdominal pain. BM twice a day. Eats raisin bran every morning. Once a day has vegetables. No reflux, dysphagia.   Past Medical History  Diagnosis Date  . Breast CA 11/14/2010  . Colon cancer 11/14/2010  . Hypertension   . Osteoporosis   . Hypercholesteremia   . Arthritis   . Peripheral neuropathy   . Depression   . Mitral valve prolapse   . Osteoporosis, senile 03/05/2011  . H/O: hysterectomy   . History of right knee surgery     for bone cancer right knee  . S/P cataract surgery     bil eyes  . Falls   . 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 Cataract extraction, bilateral    . Retinal hole repair      bilateral  . Abdominal hysterectomy    . Tonsillectomy    . Knee surgery    . Epidural steroid injection with kenalog      Midline  . Port-a-cath removal  02/04/2012    Procedure: REMOVAL PORT-A-CATH;  Surgeon: Donato Heinz, MD;  Location: [facility];  Service: General;  Laterality: N/A;  minor room  . Yag laser application Left 09/23/7626    Procedure: YAG LASER APPLICATION;  Surgeon: Elta Guadeloupe T. Gershon Crane, MD;  Location: [facility];  Service: Ophthalmology;  Laterality: Left;  . Colonoscopy  08/27/2008    BTD:VVOH papilla.  Surgical anastomosis at 8-10 cm from the anal verge.  Residual rectal and colonic mucosa appeared normal.    Current Outpatient Prescriptions  Medication Sig  Dispense Refill  . acetaminophen (TYLENOL) 325 MG tablet Take 650 mg by mouth as needed.        . ALPRAZolam (XANAX) 1 MG tablet Take 1 mg by mouth at bedtime as needed.        . citalopram (CELEXA) 20 MG tablet Take 20 mg by mouth daily.        Marland Kitchen denosumab (PROLIA) 60 MG/ML SOLN injection Inject 60 mg into the skin every 6 (six) months. Administer in upper arm, thigh, or abdomen      . fish oil-omega-3 fatty acids 1000 MG capsule Take 1 g by mouth daily.        Marland Kitchen gabapentin (NEURONTIN) 300 MG capsule Take 1 capsule (300 mg total) by mouth 4 (four) times daily.  120 capsule  6  . lisinopril-hydrochlorothiazide (PRINZIDE,ZESTORETIC) 20-12.5 MG per tablet Take 1 tablet by mouth daily.        . pravastatin (PRAVACHOL) 10 MG tablet Take 10 mg by mouth at bedtime.        No current facility-administered medications for this visit.    Allergies as of 08/25/2013 - Review Complete 08/25/2013  Allergen Reaction Noted  . Penicillins  11/22/2010    Family History  Problem Relation Age of Onset  . Stroke Mother   . Cancer Mother     breast  cancer  .  Heart attack Father   . Colon cancer Neg Hx     History   Social History  . Marital Status: Married    Spouse Name: N/A    Number of Children: N/A  . Years of Education: N/A   Occupational History  . Not on file.   Social History Main Topics  . Smoking status: Never Smoker   . Smokeless tobacco: Never Used  . Alcohol Use: No  . Drug Use: No  . Sexual Activity: No   Other Topics Concern  . Not on file   Social History Narrative  . No narrative on file    Review of Systems: Gen: Denies any fever, chills, fatigue, weight loss, lack of appetite.  CV: palpitations "sometimes"  Resp: Denies shortness of breath at rest or with exertion. Denies wheezing or cough.  GI: see HPI GU : Denies urinary burning, urinary frequency, urinary hesitancy MS: +arthritis Derm: Denies rash, itching, dry skin Psych: short term memory loss after  chemo Heme: Denies bruising, bleeding, and enlarged lymph nodes.  Physical Exam: BP 125/56  Pulse 56  Temp(Src) 98.4 F (36.9 C) (Oral)  Ht 5\' 4"  (1.626 m)  Wt 112 lb 12.8 oz (51.166 kg)  BMI 19.35 kg/m2 General:   Alert and oriented. Pleasant and cooperative. Well-nourished and well-developed.  Head:  Normocephalic and atraumatic. Eyes:  Without icterus, sclera clear and conjunctiva pink.  Ears:  Normal auditory acuity. Nose:  No deformity, discharge,  or lesions. Mouth:  No deformity or lesions, oral mucosa pink.  Neck:  Supple, without mass or thyromegaly. Lungs:  Clear to auscultation bilaterally. No wheezes, rales, or rhonchi. No distress.  Heart:  S1, S2 present without murmurs appreciated.  Abdomen:  +BS, soft, non-tender and non-distended. No HSM noted. No guarding or rebound. No masses appreciated.  Rectal:  Deferred  Msk:  Symmetrical without gross deformities. Normal posture. Extremities:  Without clubbing or edema. Neurologic:  Alert and  oriented x4;  grossly normal neurologically. Skin:  Intact without significant lesions or rashes. Psych:  Alert and cooperative. Normal mood and affect.  Lab Results  Component Value Date   WBC 4.8 03/13/2013   HGB 12.5 03/13/2013   HCT 38.4 03/13/2013   MCV 93.4 03/13/2013   PLT 155 03/13/2013   Lab Results  Component Value Date   ALT 7 03/13/2013   AST 15 03/13/2013   ALKPHOS 39 03/13/2013   BILITOT 0.4 03/13/2013   Lab Results  Component Value Date   CREATININE 1.04 03/13/2013   BUN 17 03/13/2013   NA 140 03/13/2013   K 4.0 03/13/2013   CL 102 03/13/2013   CO2 28 03/13/2013

## 2013-08-31 NOTE — Interval H&P Note (Signed)
History and Physical Interval Note:  08/31/2013 2:37 PM  Patricia Kline  has presented today for surgery, with the diagnosis of HISTORY OF COLON CA  The various methods of treatment have been discussed with the patient and family. After consideration of risks, benefits and other options for treatment, the patient has consented to  Procedure(s) with comments: COLONOSCOPY (N/A) - 2:45 as a surgical intervention .  The patient's history has been reviewed, patient examined, no change in status, stable for surgery.  I have reviewed the patient's chart and labs.  Questions were answered to the patient's satisfaction.     No change. Colonoscopy per plan.The risks, benefits, limitations, alternatives and imponderables have been reviewed with the patient. Questions have been answered. All parties are agreeable.   Cristopher Estimable Rourk

## 2013-09-02 ENCOUNTER — Encounter (HOSPITAL_COMMUNITY): Payer: Self-pay | Admitting: Internal Medicine

## 2013-09-08 ENCOUNTER — Encounter (HOSPITAL_BASED_OUTPATIENT_CLINIC_OR_DEPARTMENT_OTHER): Payer: Medicare Other

## 2013-09-08 ENCOUNTER — Encounter (HOSPITAL_COMMUNITY): Payer: Medicare Other | Attending: Oncology

## 2013-09-08 ENCOUNTER — Other Ambulatory Visit (HOSPITAL_COMMUNITY): Payer: Self-pay | Admitting: Oncology

## 2013-09-08 VITALS — BP 122/62 | HR 45 | Temp 96.9°F | Resp 16

## 2013-09-08 DIAGNOSIS — M810 Age-related osteoporosis without current pathological fracture: Secondary | ICD-10-CM | POA: Insufficient documentation

## 2013-09-08 DIAGNOSIS — C187 Malignant neoplasm of sigmoid colon: Secondary | ICD-10-CM | POA: Diagnosis not present

## 2013-09-08 DIAGNOSIS — Z853 Personal history of malignant neoplasm of breast: Secondary | ICD-10-CM

## 2013-09-08 DIAGNOSIS — C50911 Malignant neoplasm of unspecified site of right female breast: Secondary | ICD-10-CM

## 2013-09-08 DIAGNOSIS — C189 Malignant neoplasm of colon, unspecified: Secondary | ICD-10-CM

## 2013-09-08 DIAGNOSIS — C50919 Malignant neoplasm of unspecified site of unspecified female breast: Secondary | ICD-10-CM

## 2013-09-08 LAB — COMPREHENSIVE METABOLIC PANEL
ALK PHOS: 75 U/L (ref 39–117)
ALT: 44 U/L — ABNORMAL HIGH (ref 0–35)
AST: 16 U/L (ref 0–37)
Albumin: 4.1 g/dL (ref 3.5–5.2)
BILIRUBIN TOTAL: 0.5 mg/dL (ref 0.3–1.2)
BUN: 15 mg/dL (ref 6–23)
CHLORIDE: 100 meq/L (ref 96–112)
CO2: 29 meq/L (ref 19–32)
Calcium: 9.3 mg/dL (ref 8.4–10.5)
Creatinine, Ser: 1.09 mg/dL (ref 0.50–1.10)
GFR calc Af Amer: 58 mL/min — ABNORMAL LOW (ref >90)
GFR calc non Af Amer: 50 mL/min — ABNORMAL LOW (ref >90)
Glucose, Bld: 83 mg/dL (ref 70–99)
POTASSIUM: 4.6 meq/L (ref 3.7–5.3)
Sodium: 140 mEq/L (ref 137–147)
Total Protein: 7 g/dL (ref 6.0–8.3)

## 2013-09-08 LAB — CBC WITH DIFFERENTIAL/PLATELET
BASOS PCT: 1 % (ref 0–1)
Basophils Absolute: 0 10*3/uL (ref 0.0–0.1)
Eosinophils Absolute: 0.1 10*3/uL (ref 0.0–0.7)
Eosinophils Relative: 2 % (ref 0–5)
HCT: 37.8 % (ref 36.0–46.0)
HEMOGLOBIN: 12.8 g/dL (ref 12.0–15.0)
LYMPHS PCT: 23 % (ref 12–46)
Lymphs Abs: 1.2 10*3/uL (ref 0.7–4.0)
MCH: 30.5 pg (ref 26.0–34.0)
MCHC: 33.9 g/dL (ref 30.0–36.0)
MCV: 90.2 fL (ref 78.0–100.0)
MONOS PCT: 11 % (ref 3–12)
Monocytes Absolute: 0.6 10*3/uL (ref 0.1–1.0)
NEUTROS ABS: 3.5 10*3/uL (ref 1.7–7.7)
Neutrophils Relative %: 63 % (ref 43–77)
Platelets: 164 10*3/uL (ref 150–400)
RBC: 4.19 MIL/uL (ref 3.87–5.11)
RDW: 13.4 % (ref 11.5–15.5)
WBC: 5.4 10*3/uL (ref 4.0–10.5)

## 2013-09-08 MED ORDER — DENOSUMAB 60 MG/ML ~~LOC~~ SOLN
60.0000 mg | Freq: Once | SUBCUTANEOUS | Status: AC
  Administered 2013-09-08: 60 mg via SUBCUTANEOUS
  Filled 2013-09-08: qty 1

## 2013-09-08 NOTE — Progress Notes (Signed)
Labs drawn for cbc/diff, cmp, cea, ca27.29.

## 2013-09-08 NOTE — Progress Notes (Signed)
Patricia Kline Pulse presents today for injection per MD orders. prolia  administered SQ in right Abdomen. Administration without incident. Patient tolerated well.

## 2013-09-09 LAB — CANCER ANTIGEN 27.29: CA 27.29: 17 U/mL (ref 0–39)

## 2013-09-09 LAB — CEA: CEA: 1 ng/mL (ref 0.0–5.0)

## 2013-09-10 ENCOUNTER — Ambulatory Visit (HOSPITAL_COMMUNITY): Payer: Medicare Other

## 2013-09-10 ENCOUNTER — Other Ambulatory Visit (HOSPITAL_COMMUNITY): Payer: Medicare Other

## 2013-09-11 ENCOUNTER — Ambulatory Visit (HOSPITAL_COMMUNITY): Payer: Medicare Other

## 2013-09-11 ENCOUNTER — Other Ambulatory Visit (HOSPITAL_COMMUNITY): Payer: Medicare Other

## 2013-09-15 ENCOUNTER — Other Ambulatory Visit (HOSPITAL_COMMUNITY): Payer: Self-pay | Admitting: Family Medicine

## 2013-09-15 DIAGNOSIS — Z1231 Encounter for screening mammogram for malignant neoplasm of breast: Secondary | ICD-10-CM

## 2013-10-13 DIAGNOSIS — Z961 Presence of intraocular lens: Secondary | ICD-10-CM | POA: Diagnosis not present

## 2013-10-13 DIAGNOSIS — H35349 Macular cyst, hole, or pseudohole, unspecified eye: Secondary | ICD-10-CM | POA: Diagnosis not present

## 2013-10-13 DIAGNOSIS — H353190 Nonexudative age-related macular degeneration, unspecified eye, stage unspecified: Secondary | ICD-10-CM | POA: Diagnosis not present

## 2013-11-02 ENCOUNTER — Ambulatory Visit (HOSPITAL_COMMUNITY)
Admission: RE | Admit: 2013-11-02 | Discharge: 2013-11-02 | Disposition: A | Discharge status: Home / Self Care | Payer: Medicare Other | Source: Ambulatory Visit | Attending: Family Medicine | Admitting: Family Medicine

## 2013-11-02 DIAGNOSIS — Z1231 Encounter for screening mammogram for malignant neoplasm of breast: Secondary | ICD-10-CM | POA: Insufficient documentation

## 2013-12-29 DIAGNOSIS — Z23 Encounter for immunization: Secondary | ICD-10-CM | POA: Diagnosis not present

## 2013-12-29 DIAGNOSIS — Z0000 Encounter for general adult medical examination without abnormal findings: Secondary | ICD-10-CM | POA: Diagnosis not present

## 2013-12-29 DIAGNOSIS — IMO0002 Reserved for concepts with insufficient information to code with codable children: Secondary | ICD-10-CM | POA: Diagnosis not present

## 2013-12-29 DIAGNOSIS — E785 Hyperlipidemia, unspecified: Secondary | ICD-10-CM | POA: Diagnosis not present

## 2013-12-29 DIAGNOSIS — I10 Essential (primary) hypertension: Secondary | ICD-10-CM | POA: Diagnosis not present

## 2014-01-09 NOTE — Progress Notes (Signed)
[name], MD [place] [address]  Invasive ductal carcinoma of right breast - Plan: CBC with Differential, Comprehensive metabolic panel, CEA, Cancer antigen 27.29  Adenocarcinoma of sigmoid colon - Plan: CBC with Differential, Comprehensive metabolic panel, CEA  Osteoporosis, senile - Plan: CBC with Differential, Comprehensive metabolic panel  Peripheral neuropathy  CURRENT THERAPY: Surveillance per NCCN guidelines and Prolia 60 mg every 6 months (last given on 03/13/2013)  INTERVAL HISTORY: Patricia Kline 73 y.o. female returns for  regular  visit for followup of Stage III adenocarcinoma sigmoid colon status post resection on 05/29/2004 for is 6.0 cm cancer with 6 of 15 positive lymph nodes. We gave her oxaliplatin and capecitabine adjuvantly for 6 cycles, thus far without recurrence. Negative colonoscopy in May 2010 and is due for another one in May 2015 according to Dr. Sydell Axon.  AND  Stage I (T1 C. N0 M0) invasive ductal carcinoma of the right breast, ER 89% positive, PR 10% positive, HER-2/neu nonamplified, Ki-67 marker 16% status post surgical resection on 12/20/2004 after possibly 4 months of adjuvant Arimidex. The cancer was 2.0 cm pathologically, status post lumpectomy, sentinel node biopsy followed by radiation therapy followed by adjuvant Arimidex for 5 years. She cannot tolerate Arimidex so we switched her to tamoxifen. We had tried letrozole also but that was too costly for her. She therefore had stage II disease clinically at presentation and by MRI, but was reduced to stage I at the time of surgery by the adjuvant hormonal therapy.    Invasive ductal carcinoma of right breast   07/19/2004 Imaging PET- increased uptake in posterior aspect of right breast   08/09/2004 Imaging Mammogram and [place]   08/09/2004 Initial Diagnosis Invasive ductal carcinoma of right breast on needle core biopsy of right breast   08/22/2004 Imaging MRI B/L Breast- 1.8 x 1.4  x 1.3 cm right breast mass   08/29/2004 - 12/13/2004 Chemotherapy Arimidex x 4 months neoadjuvantly- 77% shrinkage by volume size   12/20/2004 Surgery Right partial mastectomy- 2 cm, ER 89%, PR 10%, Her2 negative, Ki-67 16%.     02/06/2005 - 03/28/2005 Radiation Therapy By Dr. Elba Barman   05/15/2005 - 05/06/2007 Chemotherapy Femara   08/29/2005 Imaging Mammogram s/p chemo and resection.  BIRADS 2   05/06/2007 - 09/06/2009 Chemotherapy Tamoxifen    Adenocarcinoma of sigmoid colon   05/29/2004 Initial Diagnosis Adenocarcinoma of sigmoid colon   05/29/2004 Surgery Segmental resection, 6 cm maximum size, adenocarcinoma, 6/15 lymph nodes positive for disease, T3N2   07/25/2004 - 11/15/2004 Chemotherapy Xeloda + Oxaliplatin, Avastin x 6 cycles.  Avastin d/c after cycle 1    Remission    I personally reviewed and went over laboratory results with the patient.  The results are noted within this dictation.  We will update her labs in the near future in preparation for prolia.   I personally reviewed and went over radiographic studies with the patient.  The results are noted within this dictation.  Last mammogram on 11/03/13 was BIRADS 1.   She underwent colonoscopy by Dr. Gala Romney on 08/31/2013 and a normal rectum and colon was identified.  She will be due for 1 more colonoscopy per Dr. Gala Romney.    She reports that she fell two weeks ago when she was walking and the ground was not level.  She reports that her peripheral neuropathy continues to be problematic for her.  She was evaluated for the [facility] program and she qualifies for this referral.  She excoriated  some skin on her right hand and wrist and right knee.    Oncologically, she denies any complaints and ROS questioning is negative.   Past Medical History  Diagnosis Date  . Breast CA 11/14/2010  . Colon cancer 11/14/2010  . Hypertension   . Osteoporosis   . Hypercholesteremia   . Arthritis   . Peripheral neuropathy   . Depression   . Mitral valve prolapse   .  Osteoporosis, senile 03/05/2011  . H/O: hysterectomy   . History of right knee surgery     for bone cancer right knee  . S/P cataract surgery     bil eyes  . Falls   . 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 Adenocarcinoma of sigmoid colon 11/14/2010    has Invasive ductal carcinoma of right breast; Adenocarcinoma of sigmoid colon; Osteoporosis, senile; Arthritis of back; Left leg pain; Spinal stenosis, lumbar; and Peripheral neuropathy on her problem list.     is allergic to penicillins.  Ms. Sevcik does not currently have medications on file.  Past Surgical History  Procedure Laterality Date  . Cataract extraction, bilateral    . Retinal hole repair      bilateral  . Abdominal hysterectomy    . Tonsillectomy    . Knee surgery    . Epidural steroid injection with kenalog      Midline  . Port-a-cath removal  02/04/2012    Procedure: REMOVAL PORT-A-CATH;  Surgeon: Donato Heinz, MD;  Location: [facility];  Service: General;  Laterality: N/A;  minor room  . Yag laser application Left 08/04/6977    Procedure: YAG LASER APPLICATION;  Surgeon: Elta Guadeloupe T. Gershon Crane, MD;  Location: [facility];  Service: Ophthalmology;  Laterality: Left;  . Colonoscopy  08/27/2008    Dr. Madolyn Frieze papilla.  Surgical anastomosis at 8-10 cm from the anal verge.  Residual rectal and colonic mucosa appeared normal.  . Colonoscopy N/A 08/31/2013    Procedure: COLONOSCOPY;  Surgeon: Daneil Dolin, MD;  Location: [facility];  Service: Endoscopy;  Laterality: N/A;  2:45    Denies any headaches, dizziness, double vision, fevers, chills, night sweats, nausea, vomiting, diarrhea, constipation, chest pain, heart palpitations, shortness of breath, blood in stool, black tarry stool, urinary pain, urinary burning, urinary frequency, hematuria.   PHYSICAL EXAMINATION  ECOG PERFORMANCE STATUS: 1 - Symptomatic but completely ambulatory  Filed  Vitals:   01/12/14 1430  BP: 139/53  Pulse: 51  Temp: 97.1 F (36.2 C)  Resp: 16    GENERAL:alert, healthy, no distress, well nourished, well developed, comfortable, cooperative and smiling SKIN: skin color, texture, turgor are normal, no rashes or significant lesions, excoriation on right hand, wrist, and knee HEAD: Normocephalic, No masses, lesions, tenderness or abnormalities EYES: normal, PERRLA, EOMI, Conjunctiva are pink and non-injected EARS: External ears normal OROPHARYNX:mucous membranes are moist  NECK: supple, no adenopathy, thyroid normal size, non-tender, without nodularity, no stridor, non-tender, trachea midline LYMPH:  no palpable lymphadenopathy, no hepatosplenomegaly BREAST:breasts appear normal, no suspicious masses, no skin or nipple changes or axillary nodes LUNGS: clear to auscultation and percussion HEART: regular rate & rhythm, no murmurs, no gallops, S1 normal and S2 normal ABDOMEN:abdomen soft, non-tender, normal bowel sounds, no masses or organomegaly and no hepatosplenomegaly BACK: Back symmetric, no curvature., No CVA tenderness EXTREMITIES:less then 2 second capillary refill, no joint deformities, effusion, or inflammation, no edema, no skin discoloration, no clubbing, no  cyanosis  NEURO: alert & oriented x 3 with fluent speech, no focal motor/sensory deficits, gait normal   LABORATORY DATA: CBC    Component Value Date/Time   WBC 5.4 09/08/2013 0958   RBC 4.19 09/08/2013 0958   HGB 12.8 09/08/2013 0958   HCT 37.8 09/08/2013 0958   PLT 164 09/08/2013 0958   MCV 90.2 09/08/2013 0958   MCH 30.5 09/08/2013 0958   MCHC 33.9 09/08/2013 0958   RDW 13.4 09/08/2013 0958   LYMPHSABS 1.2 09/08/2013 0958   MONOABS 0.6 09/08/2013 0958   EOSABS 0.1 09/08/2013 0958   BASOSABS 0.0 09/08/2013 0958      Chemistry      Component Value Date/Time   NA 140 09/08/2013 0958   K 4.6 09/08/2013 0958   CL 100 09/08/2013 0958   CO2 29 09/08/2013 0958   BUN 15 09/08/2013 0958   CREATININE 1.09  09/08/2013 0958      Component Value Date/Time   CALCIUM 9.3 09/08/2013 0958   ALKPHOS 75 09/08/2013 0958   AST 16 09/08/2013 0958   ALT 44* 09/08/2013 0958   BILITOT 0.5 09/08/2013 0958       RADIOGRAPHIC STUDIES:  11/03/2013  CLINICAL DATA: Screening.  EXAM:  DIGITAL SCREENING BILATERAL MAMMOGRAM WITH CAD  COMPARISON: Previous exam(s).  ACR Breast Density Category c: The breast tissue is heterogeneously  dense, which may obscure small masses.  FINDINGS:  There are no findings suspicious for malignancy. Images were  processed with CAD.  IMPRESSION:  No mammographic evidence of malignancy. A result letter of this  screening mammogram will be mailed directly to the patient.  RECOMMENDATION:  Screening mammogram in one year. (Code:SM-B-01Y)  BI-RADS CATEGORY 1: Negative.  Electronically Signed  By: Abelardo Diesel M.D.  On: 11/03/2013 09:10    ASSESSMENT:  1. Stage III adenocarcinoma sigmoid colon status post resection on 05/29/2004 for this 6.0 cm cancer with 6 of 15 positive lymph nodes. We gave her oxaliplatin and capecitabine adjuvantly for 6 cycles, thus far without recurrence. Negative colonoscopy in June 2015 and is due for another one in June 2020 according to Dr. Sydell Axon.  2. Stage I (T1 C. 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 The cancer was 2.0 cm pathologically, status post lumpectomy, sentinel node biopsy followed by radiation therapy followed by adjuvant Arimidex for 5 years. She could not tolerate Arimidex so we switched her to tamoxifen. We had tried letrozole also but that was too costly for her. She therefore had stage II disease clinically at presentation and by MRI, but was reduced to stage I at the time of surgery by the adjuvant hormonal therapy.  3. 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 She sees Dr. Ace Gins where she receives epidural steroid injections which helps she states.  4. Mitral valve prolapse  5. Hypertension  6. Peripheral neuropathy  7. Osteoporosis and she had a bone density in August 2014 which stable and slightly improved with Prolia every 6 months, calcium, vitamin D. She will be due for another bone density in August 2016.  Patient Active Problem List   Diagnosis Date Noted  . Peripheral neuropathy 01/17/2012  . Spinal stenosis, lumbar 05/22/2011  . Left leg pain 03/23/2011  . Arthritis of back 03/21/2011  . Osteoporosis, senile 03/05/2011  . Invasive ductal carcinoma of right breast  11/14/2010  . Adenocarcinoma of sigmoid colon 11/14/2010     PLAN:  1. I personally reviewed and went over laboratory results with the patient.  The results are noted within this dictation. 2. I personally reviewed and went over radiographic studies with the patient.  The results are noted within this dictation.   3. Next screening mammogram is due in 11/2014 4. Next bone density is due in August 2016. 5. Continue with Prolia every 6 months. Supportive therapy plan reviewed and update appropriately. Last given on 09/08/2013. 6. Continue Ca++ and Vit D. 7. Labs in 8 months: CBC diff, CMET, CEA, CA 27.29 8. Will ascertain labs from [place] 9. Return in 8 months for follow-up at which time aforementioned labs will be completed and Prolia injection administered.  If all is well at that time, we will see her annually for follow-up.    THERAPY PLAN:  5 years worth of surveillance are complete from a colon cancer standpoint. Therefore, NCCN guidelines does not recommend CEA monitoring or surveillance imaging.  From a breast cancer standpoint, we will continue with breast exams and perform annual screening mammograms.  From an osteoporosis standpoint, we will continue with Prolia every 6 months as her most recent bone scan is improved. She is encouraged to  continue Ca++ and Vit D supplementation.   All questions were answered. The patient knows to call the clinic with any problems, questions or concerns. We can certainly see the patient much sooner if necessary.  Patient and plan discussed with Dr. Farrel Gobble and he is in agreement with the aforementioned.   KEFALAS,THOMAS 01/12/2014

## 2014-01-11 ENCOUNTER — Ambulatory Visit (HOSPITAL_COMMUNITY): Payer: Medicare Other | Admitting: Oncology

## 2014-01-12 ENCOUNTER — Encounter (HOSPITAL_COMMUNITY): Payer: Medicare Other | Attending: Oncology | Admitting: Oncology

## 2014-01-12 ENCOUNTER — Encounter (HOSPITAL_COMMUNITY): Payer: Self-pay | Admitting: Oncology

## 2014-01-12 VITALS — BP 139/53 | HR 51 | Temp 97.1°F | Resp 16 | Wt 117.4 lb

## 2014-01-12 DIAGNOSIS — Z85038 Personal history of other malignant neoplasm of large intestine: Secondary | ICD-10-CM

## 2014-01-12 DIAGNOSIS — C50911 Malignant neoplasm of unspecified site of right female breast: Secondary | ICD-10-CM | POA: Diagnosis not present

## 2014-01-12 DIAGNOSIS — G629 Polyneuropathy, unspecified: Secondary | ICD-10-CM

## 2014-01-12 DIAGNOSIS — C187 Malignant neoplasm of sigmoid colon: Secondary | ICD-10-CM

## 2014-01-12 DIAGNOSIS — M810 Age-related osteoporosis without current pathological fracture: Secondary | ICD-10-CM

## 2014-01-12 NOTE — Progress Notes (Signed)
STAR Program Physical Impairment and Functional Assessment Screening Tool  1. Are you having any pain, including headaches, joint pain, or muscle pain (upper body = OT; lower body = PT)?  Yes, this started after my diagnosis and is still a problem.  2. Do your hands and/or feet feel numb or tingle (PT)?  Yes, this started after my diagnosis and is still a problem.  3. Does any part of your body feel swollen or larger than usual (upper body = OT; lower body = PT)?  No  4. Are you so tired that you cannot do the things you want or need to do (PT or OT)?  No  5. Are you feeling weak or are you having trouble moving any part of your body (PT/OT)?  No  6. Are you having trouble concentrating, thinking, or remembering things (OT/ST)?  Yes, this started after my diagnosis and is still a problem.  7. Are you having trouble moving around or feel like you might trip or fall (PT)?  Yes, this started after my diagnosis and is still a problem.  8. Are you having trouble swallowing (ST)?  No  9. Are you having trouble speaking (ST)?  No  10. Are you having trouble with going or getting to the bathroom (OT)?  No  11. Are you having trouble with your sexual function (OT)?  No  12. Are you having trouble lifting things, even just your arms (OT/PT)?  No  13. Are you having trouble taking care of yourself as in dressing or bathing (OT)?  No  14. Are you having trouble with daily tasks like chores or shopping (OT)?  No  15. Are you having trouble driving (OT)?  No  16. Are you having trouble returning to work or completing your tasks at work (OT)?  No  Other concerns:    Legend: OT = Occupational Therapy PT = Physical Therapy ST = Speech Therapy

## 2014-01-12 NOTE — Addendum Note (Signed)
Addended by: Mellissa Kohut on: 01/12/2014 05:32 PM   Modules accepted: Orders

## 2014-01-12 NOTE — Patient Instructions (Signed)
Major Discharge Instructions  RECOMMENDATIONS MADE BY THE CONSULTANT AND ANY TEST RESULTS WILL BE SENT TO YOUR REFERRING PHYSICIAN.  EXAM FINDINGS BY THE PHYSICIAN TODAY AND SIGNS OR SYMPTOMS TO REPORT TO CLINIC OR PRIMARY PHYSICIAN: Exam and findings as discussed by Orland Mustard, PA - C.  You are doing well.  Report changes in bowel habits, blood in your stool, any new lumps, bone pain or shortness of breath or other concerns.    INSTRUCTIONS/FOLLOW-UP: Follow-up as scheduled for labs and prolia Return for labs, prolia and exam in 8 months.  Thank you for choosing [facility] to provide your oncology and hematology care.  To afford each patient quality time with our providers, please arrive at least 15 minutes before your scheduled appointment time.  With your help, our goal is to use those 15 minutes to complete the necessary work-up to ensure our physicians have the information they need to help with your evaluation and healthcare recommendations.    Effective January 1st, 2014, we ask that you re-schedule your appointment with our physicians should you arrive 10 or more minutes late for your appointment.  We strive to give you quality time with our providers, and arriving late affects you and other patients whose appointments are after yours.    Again, thank you for choosing [facility].  Our hope is that these requests will decrease the amount of time that you wait before being seen by our physicians.       _____________________________________________________________  Should you have questions after your visit to [facility], please contact our office at [phone] between the hours of 8:30 a.m. and 4:30 p.m.  Voicemails left after 4:30 p.m. will not be returned until the following business day.  For prescription refill requests, have your pharmacy contact our office with your prescription refill request.     _______________________________________________________________  We hope that we have given you very good care.  You may receive a patient satisfaction survey in the mail, please complete it and return it as soon as possible.  We value your feedback!  _______________________________________________________________  Have you asked about our STAR program?  STAR stands for Survivorship Training and Rehabilitation, and this is a nationally recognized cancer care program that focuses on survivorship and rehabilitation.  Cancer and cancer treatments may cause problems, such as, pain, making you feel tired and keeping you from doing the things that you need or want to do. Cancer rehabilitation can help. Our goal is to reduce these troubling effects and help you have the best quality of life possible.  You may receive a survey from a nurse that asks questions about your current state of health.  Based on the survey results, all eligible patients will be referred to the [facility] program for an evaluation so we can better serve you!  A frequently asked questions sheet is available upon request.

## 2014-02-10 ENCOUNTER — Ambulatory Visit (HOSPITAL_COMMUNITY): Payer: Medicare Other | Admitting: Physical Therapy

## 2014-03-09 ENCOUNTER — Encounter (HOSPITAL_BASED_OUTPATIENT_CLINIC_OR_DEPARTMENT_OTHER): Payer: Medicare Other

## 2014-03-09 ENCOUNTER — Encounter (HOSPITAL_COMMUNITY): Payer: Self-pay

## 2014-03-09 ENCOUNTER — Encounter (HOSPITAL_COMMUNITY): Payer: Medicare Other | Attending: Oncology

## 2014-03-09 DIAGNOSIS — M810 Age-related osteoporosis without current pathological fracture: Secondary | ICD-10-CM

## 2014-03-09 DIAGNOSIS — C50911 Malignant neoplasm of unspecified site of right female breast: Secondary | ICD-10-CM | POA: Diagnosis not present

## 2014-03-09 DIAGNOSIS — C187 Malignant neoplasm of sigmoid colon: Secondary | ICD-10-CM | POA: Insufficient documentation

## 2014-03-09 LAB — COMPREHENSIVE METABOLIC PANEL
ALT: 8 U/L (ref 0–35)
ANION GAP: 13 (ref 5–15)
AST: 15 U/L (ref 0–37)
Albumin: 4.2 g/dL (ref 3.5–5.2)
Alkaline Phosphatase: 42 U/L (ref 39–117)
BUN: 25 mg/dL — AB (ref 6–23)
CO2: 26 mEq/L (ref 19–32)
CREATININE: 1.33 mg/dL — AB (ref 0.50–1.10)
Calcium: 9 mg/dL (ref 8.4–10.5)
Chloride: 100 mEq/L (ref 96–112)
GFR calc Af Amer: 45 mL/min — ABNORMAL LOW (ref >90)
GFR calc non Af Amer: 39 mL/min — ABNORMAL LOW (ref >90)
Glucose, Bld: 88 mg/dL (ref 70–99)
Potassium: 4.3 mEq/L (ref 3.7–5.3)
Sodium: 139 mEq/L (ref 137–147)
Total Bilirubin: 0.6 mg/dL (ref 0.3–1.2)
Total Protein: 7.1 g/dL (ref 6.0–8.3)

## 2014-03-09 LAB — CBC WITH DIFFERENTIAL/PLATELET
BASOS PCT: 0 % (ref 0–1)
Basophils Absolute: 0 10*3/uL (ref 0.0–0.1)
EOS ABS: 0 10*3/uL (ref 0.0–0.7)
Eosinophils Relative: 1 % (ref 0–5)
HEMATOCRIT: 36.1 % (ref 36.0–46.0)
HEMOGLOBIN: 12.2 g/dL (ref 12.0–15.0)
Lymphocytes Relative: 16 % (ref 12–46)
Lymphs Abs: 1.1 10*3/uL (ref 0.7–4.0)
MCH: 31 pg (ref 26.0–34.0)
MCHC: 33.8 g/dL (ref 30.0–36.0)
MCV: 91.6 fL (ref 78.0–100.0)
Monocytes Absolute: 0.8 10*3/uL (ref 0.1–1.0)
Monocytes Relative: 11 % (ref 3–12)
Neutro Abs: 4.9 10*3/uL (ref 1.7–7.7)
Neutrophils Relative %: 72 % (ref 43–77)
Platelets: 163 10*3/uL (ref 150–400)
RBC: 3.94 MIL/uL (ref 3.87–5.11)
RDW: 13.5 % (ref 11.5–15.5)
WBC: 6.8 10*3/uL (ref 4.0–10.5)

## 2014-03-09 MED ORDER — DENOSUMAB 60 MG/ML ~~LOC~~ SOLN
60.0000 mg | Freq: Once | SUBCUTANEOUS | Status: AC
  Administered 2014-03-09: 60 mg via SUBCUTANEOUS
  Filled 2014-03-09: qty 1

## 2014-03-09 NOTE — Progress Notes (Signed)
Patricia Kline's reason for visit today is for an injection and labs as scheduled per MD orders.  Labs were drawn prior to administration of ordered medication.   Patricia Kline also received prolia per MD orders; see [facility] for administration details.  Patricia Kline tolerated all procedures well and without incident; questions were answered and patient was discharged.

## 2014-03-09 NOTE — Patient Instructions (Signed)
[place] Discharge Instructions  RECOMMENDATIONS MADE BY THE CONSULTANT AND ANY TEST RESULTS WILL BE SENT TO YOUR REFERRING PHYSICIAN. You had your prolia injection today.  Please call the clinic if you have any questions or concerns  Thank you for choosing [facility] to provide your oncology and hematology care.  To afford each patient quality time with our providers, please arrive at least 15 minutes before your scheduled appointment time.  With your help, our goal is to use those 15 minutes to complete the necessary work-up to ensure our physicians have the information they need to help with your evaluation and healthcare recommendations.    Effective January 1st, 2014, we ask that you re-schedule your appointment with our physicians should you arrive 10 or more minutes late for your appointment.  We strive to give you quality time with our providers, and arriving late affects you and other patients whose appointments are after yours.    Again, thank you for choosing [facility].  Our hope is that these requests will decrease the amount of time that you wait before being seen by our physicians.       _____________________________________________________________  Should you have questions after your visit to [facility], please contact our office at [phone] between the hours of 8:30 a.m. and 5:00 p.m.  Voicemails left after 4:30 p.m. will not be returned until the following business day.  For prescription refill requests, have your pharmacy contact our office with your prescription refill request.

## 2014-03-09 NOTE — Progress Notes (Signed)
Labs for cbcd,cmp,cea,[id number]

## 2014-03-10 LAB — CANCER ANTIGEN 27.29: CA 27.29: 12 U/mL (ref 0–39)

## 2014-03-10 LAB — CEA: CEA: 1.1 ng/mL (ref 0.0–5.0)

## 2014-05-28 DIAGNOSIS — F419 Anxiety disorder, unspecified: Secondary | ICD-10-CM | POA: Diagnosis not present

## 2014-05-28 DIAGNOSIS — Z6820 Body mass index (BMI) 20.0-20.9, adult: Secondary | ICD-10-CM | POA: Diagnosis not present

## 2014-09-08 ENCOUNTER — Encounter (HOSPITAL_COMMUNITY): Payer: Medicare Other

## 2014-09-08 ENCOUNTER — Other Ambulatory Visit (HOSPITAL_COMMUNITY): Payer: Medicare Other

## 2014-09-08 ENCOUNTER — Ambulatory Visit (HOSPITAL_COMMUNITY): Payer: Medicare Other

## 2014-09-08 ENCOUNTER — Encounter (HOSPITAL_COMMUNITY): Payer: Medicare Other | Attending: Oncology | Admitting: Oncology

## 2014-09-08 ENCOUNTER — Encounter (HOSPITAL_BASED_OUTPATIENT_CLINIC_OR_DEPARTMENT_OTHER): Payer: Medicare Other

## 2014-09-08 ENCOUNTER — Encounter (HOSPITAL_COMMUNITY): Payer: Self-pay | Admitting: Oncology

## 2014-09-08 ENCOUNTER — Ambulatory Visit (HOSPITAL_COMMUNITY): Payer: Medicare Other | Admitting: Oncology

## 2014-09-08 VITALS — BP 140/46 | HR 52 | Temp 98.1°F | Resp 16 | Wt 114.7 lb

## 2014-09-08 DIAGNOSIS — M810 Age-related osteoporosis without current pathological fracture: Secondary | ICD-10-CM | POA: Insufficient documentation

## 2014-09-08 DIAGNOSIS — C50911 Malignant neoplasm of unspecified site of right female breast: Secondary | ICD-10-CM | POA: Insufficient documentation

## 2014-09-08 DIAGNOSIS — Z85038 Personal history of other malignant neoplasm of large intestine: Secondary | ICD-10-CM | POA: Diagnosis not present

## 2014-09-08 DIAGNOSIS — C187 Malignant neoplasm of sigmoid colon: Secondary | ICD-10-CM | POA: Insufficient documentation

## 2014-09-08 LAB — CBC WITH DIFFERENTIAL/PLATELET
BASOS PCT: 1 % (ref 0–1)
Basophils Absolute: 0 10*3/uL (ref 0.0–0.1)
EOS ABS: 0.1 10*3/uL (ref 0.0–0.7)
Eosinophils Relative: 1 % (ref 0–5)
HCT: 37.3 % (ref 36.0–46.0)
Hemoglobin: 12.5 g/dL (ref 12.0–15.0)
LYMPHS PCT: 18 % (ref 12–46)
Lymphs Abs: 1.1 10*3/uL (ref 0.7–4.0)
MCH: 30.9 pg (ref 26.0–34.0)
MCHC: 33.5 g/dL (ref 30.0–36.0)
MCV: 92.1 fL (ref 78.0–100.0)
Monocytes Absolute: 0.6 10*3/uL (ref 0.1–1.0)
Monocytes Relative: 9 % (ref 3–12)
NEUTROS ABS: 4.4 10*3/uL (ref 1.7–7.7)
Neutrophils Relative %: 71 % (ref 43–77)
Platelets: 181 10*3/uL (ref 150–400)
RBC: 4.05 MIL/uL (ref 3.87–5.11)
RDW: 13.5 % (ref 11.5–15.5)
WBC: 6.1 10*3/uL (ref 4.0–10.5)

## 2014-09-08 LAB — COMPREHENSIVE METABOLIC PANEL
ALBUMIN: 4.4 g/dL (ref 3.5–5.0)
ALT: 11 U/L — AB (ref 14–54)
AST: 15 U/L (ref 15–41)
Alkaline Phosphatase: 38 U/L (ref 38–126)
Anion gap: 10 (ref 5–15)
BILIRUBIN TOTAL: 0.6 mg/dL (ref 0.3–1.2)
BUN: 23 mg/dL — AB (ref 6–20)
CO2: 28 mmol/L (ref 22–32)
Calcium: 9.2 mg/dL (ref 8.9–10.3)
Chloride: 101 mmol/L (ref 101–111)
Creatinine, Ser: 1.2 mg/dL — ABNORMAL HIGH (ref 0.44–1.00)
GFR calc Af Amer: 51 mL/min — ABNORMAL LOW (ref >60)
GFR, EST NON AFRICAN AMERICAN: 44 mL/min — AB (ref >60)
Glucose, Bld: 90 mg/dL (ref 65–99)
POTASSIUM: 4.4 mmol/L (ref 3.5–5.1)
SODIUM: 139 mmol/L (ref 135–145)
Total Protein: 7.1 g/dL (ref 6.5–8.1)

## 2014-09-08 MED ORDER — DENOSUMAB 60 MG/ML ~~LOC~~ SOLN
60.0000 mg | Freq: Once | SUBCUTANEOUS | Status: AC
  Administered 2014-09-08: 60 mg via SUBCUTANEOUS
  Filled 2014-09-08: qty 1

## 2014-09-08 NOTE — Progress Notes (Signed)
[name], MD [place] [address]  Invasive ductal carcinoma of right breast - Plan: CBC with Differential, Comprehensive metabolic panel, Cancer antigen 27.29, CBC with Differential, Comprehensive metabolic panel, Cancer antigen 27.29  Adenocarcinoma of sigmoid colon - Plan: CBC with Differential, Comprehensive metabolic panel, CEA, CBC with Differential, Comprehensive metabolic panel, CEA  Osteoporosis, senile - Plan: Comprehensive metabolic panel, Comprehensive metabolic panel, Comprehensive metabolic panel, Vitamin D 25 hydroxy  CURRENT THERAPY: Surveillance per NCCN guidelines and Prolia 60 mg every 6 months (last given on 03/13/2013)  INTERVAL HISTORY: Patricia Kline 74 y.o. female returns for  regular  visit for followup of Stage III adenocarcinoma sigmoid colon status post resection on 05/29/2004 for is 6.0 cm cancer with 6 of 15 positive lymph nodes. We gave her oxaliplatin and capecitabine adjuvantly for 6 cycles, thus far without recurrence. Negative colonoscopy in May 2010 and is due for another one in June 2020 according to Dr. Gala Romney.  AND  Stage I (T1 C. N0 M0) invasive ductal carcinoma of the right breast, ER 89% positive, PR 10% positive, HER-2/neu nonamplified, Ki-67 marker 16% status post surgical resection on 12/20/2004 after possibly 4 months of adjuvant Arimidex. The cancer was 2.0 cm pathologically, status post lumpectomy, sentinel node biopsy followed by radiation therapy followed by adjuvant Arimidex for 5 years. She cannot tolerate Arimidex so we switched her to tamoxifen. We had tried letrozole also but that was too costly for her. She therefore had stage II disease clinically at presentation and by MRI, but was reduced to stage I at the time of surgery by the adjuvant hormonal therapy.    Invasive ductal carcinoma of right breast   07/19/2004 Imaging PET- increased uptake in posterior aspect of right breast   08/09/2004 Imaging  Mammogram and [place]   08/09/2004 Initial Diagnosis Invasive ductal carcinoma of right breast on needle core biopsy of right breast   08/22/2004 Imaging MRI B/L Breast- 1.8 x 1.4 x 1.3 cm right breast mass   08/29/2004 - 12/13/2004 Chemotherapy Arimidex x 4 months neoadjuvantly- 77% shrinkage by volume size   12/20/2004 Surgery Right partial mastectomy- 2 cm, ER 89%, PR 10%, Her2 negative, Ki-67 16%.     02/06/2005 - 03/28/2005 Radiation Therapy By Dr. Elba Barman   05/15/2005 - 05/06/2007 Chemotherapy Femara   08/29/2005 Imaging Mammogram s/p chemo and resection.  BIRADS 2   05/06/2007 - 09/06/2009 Chemotherapy Tamoxifen    Adenocarcinoma of sigmoid colon   05/29/2004 Initial Diagnosis Adenocarcinoma of sigmoid colon   05/29/2004 Surgery Segmental resection, 6 cm maximum size, adenocarcinoma, 6/15 lymph nodes positive for disease, T3N2   07/25/2004 - 11/15/2004 Chemotherapy Xeloda + Oxaliplatin, Avastin x 6 cycles.  Avastin d/c after cycle 1    Remission    I personally reviewed and went over laboratory results with the patient.  The results are noted within this dictation.    I personally reviewed and went over radiographic studies with the patient.  The results are noted within this dictation.  Last mammogram on 11/03/13 was BIRADS 1.   She underwent colonoscopy by Dr. Gala Romney on 08/31/2013 and a normal rectum and colon was identified.  She will be due for 1 more colonoscopy per Dr. Gala Romney in June 2020.    She is doing well without any oncology complaints.  She is tolerating Prolia well.  She is due for an injection today.  She notes some issues with fatigue and tiredness when she is reading, which she likes to  do.  She will discuss this further with her primary care provider at her upcoming physical exam.  She also notes some symptoms of orthostatic hypotension.  She is provided education regarding orthostatic hypotension.  She is encouraged increase PO fluid intake.  Oncologically, she denies any complaints and ROS  questioning is negative.   Past Medical History  Diagnosis Date  . Breast CA 11/14/2010  . Colon cancer 11/14/2010  . Hypertension   . Osteoporosis   . Hypercholesteremia   . Arthritis   . Peripheral neuropathy   . Depression   . Mitral valve prolapse   . Osteoporosis, senile 03/05/2011  . H/O: hysterectomy   . History of right knee surgery     for bone cancer right knee  . S/P cataract surgery     bil eyes  . Falls   . 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 Adenocarcinoma of sigmoid colon 11/14/2010    has Invasive ductal carcinoma of right breast; Adenocarcinoma of sigmoid colon; Osteoporosis, senile; Arthritis of back; Left leg pain; Spinal stenosis, lumbar; and Peripheral neuropathy on her problem list.     is allergic to penicillins.  We administered denosumab.  Past Surgical History  Procedure Laterality Date  . Cataract extraction, bilateral    . Retinal hole repair      bilateral  . Abdominal hysterectomy    . Tonsillectomy    . Knee surgery    . Epidural steroid injection with kenalog      Midline  . Port-a-cath removal  02/04/2012    Procedure: REMOVAL PORT-A-CATH;  Surgeon: Donato Heinz, MD;  Location: [facility];  Service: General;  Laterality: N/A;  minor room  . Yag laser application Left 07/14/2438    Procedure: YAG LASER APPLICATION;  Surgeon: Elta Guadeloupe T. Gershon Crane, MD;  Location: [facility] OR[facility];  Service: Ophthalmology;  Laterality: Left;  . Colonoscopy  08/27/2008    Dr. Madolyn Frieze papilla.  Surgical anastomosis at 8-10 cm from the anal verge.  Residual rectal and colonic mucosa appeared normal.  . Colonoscopy N/A 08/31/2013    Procedure: COLONOSCOPY;  Surgeon: Daneil Dolin, MD;  Location: [facility];  Service: Endoscopy;  Laterality: N/A;  2:45    Denies any headaches, dizziness, double vision, fevers, chills, night sweats, nausea, vomiting, diarrhea, constipation, chest pain,  heart palpitations, shortness of breath, blood in stool, black tarry stool, urinary pain, urinary burning, urinary frequency, hematuria.   PHYSICAL EXAMINATION  ECOG PERFORMANCE STATUS: 1 - Symptomatic but completely ambulatory  Filed Vitals:   09/08/14 1254  BP: 140/46  Pulse: 52  Temp: 98.1 F (36.7 C)  Resp: 16    GENERAL:alert, healthy, no distress, well nourished, well developed, comfortable, cooperative and smiling SKIN: skin color, texture, turgor are normal, no rashes or significant lesions. HEAD: Normocephalic, No masses, lesions, tenderness or abnormalities EYES: normal, PERRLA, EOMI, Conjunctiva are pink and non-injected EARS: External ears normal OROPHARYNX:mucous membranes are moist  NECK: supple, no adenopathy, thyroid normal size, non-tender, without nodularity, no stridor, non-tender, trachea midline LYMPH:  no palpable lymphadenopathy, no hepatosplenomegaly BREAST:breasts exam deferred today.  She is provided education regarding self breast exams. LUNGS: clear to auscultation and percussion HEART: regular rate & rhythm, no murmurs, no gallops, S1 normal and S2 normal ABDOMEN:abdomen soft, non-tender, normal bowel sounds, no masses or organomegaly and no hepatosplenomegaly BACK: Back symmetric, no curvature., No CVA tenderness EXTREMITIES:less then 2 second capillary  refill, no joint deformities, effusion, or inflammation, no edema, no skin discoloration, no clubbing, no cyanosis  NEURO: alert & oriented x 3 with fluent speech, no focal motor/sensory deficits, gait normal   LABORATORY DATA: CBC    Component Value Date/Time   WBC 6.1 09/08/2014 1203   RBC 4.05 09/08/2014 1203   HGB 12.5 09/08/2014 1203   HCT 37.3 09/08/2014 1203   PLT 181 09/08/2014 1203   MCV 92.1 09/08/2014 1203   MCH 30.9 09/08/2014 1203   MCHC 33.5 09/08/2014 1203   RDW 13.5 09/08/2014 1203   LYMPHSABS 1.1 09/08/2014 1203   MONOABS 0.6 09/08/2014 1203   EOSABS 0.1 09/08/2014 1203    BASOSABS 0.0 09/08/2014 1203      Chemistry      Component Value Date/Time   NA 139 09/08/2014 1203   K 4.4 09/08/2014 1203   CL 101 09/08/2014 1203   CO2 28 09/08/2014 1203   BUN 23* 09/08/2014 1203   CREATININE 1.20* 09/08/2014 1203      Component Value Date/Time   CALCIUM 9.2 09/08/2014 1203   ALKPHOS 38 09/08/2014 1203   AST 15 09/08/2014 1203   ALT 11* 09/08/2014 1203   BILITOT 0.6 09/08/2014 1203     Lab Results  Component Value Date   CEA 1.1 03/09/2014   Lab Results  Component Value Date   LABCA2 12 03/09/2014     RADIOGRAPHIC STUDIES:  11/03/2013  CLINICAL DATA: Screening.  EXAM:  DIGITAL SCREENING BILATERAL MAMMOGRAM WITH CAD  COMPARISON: Previous exam(s).  ACR Breast Density Category c: The breast tissue is heterogeneously  dense, which may obscure small masses.  FINDINGS:  There are no findings suspicious for malignancy. Images were  processed with CAD.  IMPRESSION:  No mammographic evidence of malignancy. A result letter of this  screening mammogram will be mailed directly to the patient.  RECOMMENDATION:  Screening mammogram in one year. (Code:[id number])  BI-RADS CATEGORY 1: Negative.  Electronically Signed  By: Abelardo Diesel M.D.  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 We gave her oxaliplatin and capecitabine adjuvantly for 6 cycles, thus far without recurrence. Negative colonoscopy in June 2015 and is due for another one in June 2020 according to Dr. Sydell Axon.   Labs today: CBC diff, CMET, CEA.  Labs in 1 year: CBC diff, CMET, CEA.  Return in 1 year for follow-up.  She is hesitant about a 1 year follow-up compared to every 6 months, but she is far enough out from her malignancies that annual follow-up is most appropriate.  Invasive ductal carcinoma of right breast Stage I  (T1 C. 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 The cancer was 2.0 cm pathologically, status post lumpectomy, sentinel node biopsy followed by radiation therapy followed by adjuvant Arimidex for 5 years. She could not tolerate Arimidex so we switched her to tamoxifen. We had tried letrozole also but that was too costly for her. She therefore had stage II disease clinically at presentation and by MRI, but was reduced to stage I at the time of surgery by the adjuvant hormonal therapy.   Labs today: CBC diff, CMET, CA 27.29  Labs in 1 year: CBC diff, CMET  Osteoporosis,  senile Bone density in April 2014 demonstrates stable and slightly improved bone density.  Taking Prolia every 6 months without Ca++ and Vit D supplements due to intolerance with muscle cramping.  She is encouraged to eat food products high in calcium.   Due for Prolia injection today.  Labs today: CMET  Labs in 6 months: CMET  Labs in 12 months: CMET.  Prolia injection in 6 months and 12 months.  Bone density exam in 2 months or so.  Return in 12 months.    THERAPY PLAN:  5 years worth of surveillance are complete from a colon cancer standpoint. Therefore, NCCN guidelines does not recommend CEA monitoring or surveillance imaging.  From a breast cancer standpoint, we will continue with breast exams and perform annual screening mammograms.  From an osteoporosis standpoint, we will continue with Prolia every 6 months as her most recent bone scan is improved. She is encouraged to continue Ca++ and Vit D supplementation.   All questions were answered. The patient knows to call the clinic with any problems, questions or concerns. We can certainly see the patient much sooner if necessary.  Patient and plan discussed with Dr. Ancil Linsey and she is in  agreement with the aforementioned.   Patricia Kline 09/08/2014

## 2014-09-08 NOTE — Assessment & Plan Note (Addendum)
Bone density in April 2014 demonstrates stable and slightly improved bone density.  Taking Prolia every 6 months without Ca++ and Vit D supplements due to intolerance with muscle cramping.  She is encouraged to eat food products high in calcium.   Due for Prolia injection today.  Labs today: CMET  Labs in 6 months: CMET  Labs in 12 months: CMET.  Prolia injection in 6 months and 12 months.  Bone density exam in 2 months or so.  Return in 12 months.

## 2014-09-08 NOTE — Progress Notes (Signed)
LABS DRAWN

## 2014-09-08 NOTE — Assessment & Plan Note (Addendum)
Stage III adenocarcinoma sigmoid colon status post resection on 05/29/2004 for this 6.0 cm cancer with 6 of 15 positive lymph nodes. We gave her oxaliplatin and capecitabine adjuvantly for 6 cycles, thus far without recurrence. Negative colonoscopy in June 2015 and is due for another one in June 2020 according to Dr. Sydell Axon.   Labs today: CBC diff, CMET, CEA.  Labs in 1 year: CBC diff, CMET, CEA.  Return in 1 year for follow-up.  She is hesitant about a 1 year follow-up compared to every 6 months, but she is far enough out from her malignancies that annual follow-up is most appropriate.

## 2014-09-08 NOTE — Progress Notes (Signed)
Please see doctors encounter for more information 

## 2014-09-08 NOTE — Progress Notes (Signed)
..  Patricia Kline presents today for injection per the provider's orders.  prolia administration without incident; see MAR for injection details.  Patient tolerated procedure well and without incident.  No questions or complaints noted at this time.

## 2014-09-08 NOTE — Assessment & Plan Note (Signed)
Stage I (T1 C. 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 The cancer was 2.0 cm pathologically, status post lumpectomy, sentinel node biopsy followed by radiation therapy followed by adjuvant Arimidex for 5 years. She could not tolerate Arimidex so we switched her to tamoxifen. We had tried letrozole also but that was too costly for her. She therefore had stage II disease clinically at presentation and by MRI, but was reduced to stage I at the time of surgery by the adjuvant hormonal therapy.   Labs today: CBC diff, CMET, CA 27.29  Labs in 1 year: CBC diff, CMET

## 2014-09-08 NOTE — Patient Instructions (Signed)
..  [place] at [facility] Discharge Instructions  RECOMMENDATIONS MADE BY THE CONSULTANT AND ANY TEST RESULTS WILL BE SENT TO YOUR REFERRING PHYSICIAN.  Prolia today  Bone density August 2016 Labs in 6 months with prolia again  Return to see [place] in 12 months  Thank you for choosing [place] at [facility] to provide your oncology and hematology care.  To afford each patient quality time with our provider, please arrive at least 15 minutes before your scheduled appointment time.    You need to re-schedule your appointment should you arrive 10 or more minutes late.  We strive to give you quality time with our providers, and arriving late affects you and other patients whose appointments are after yours.  Also, if you no show three or more times for appointments you may be dismissed from the clinic at the providers discretion.     Again, thank you for choosing [facility].  Our hope is that these requests will decrease the amount of time that you wait before being seen by our physicians.       _____________________________________________________________  Should you have questions after your visit to [facility], please contact our office at [phone] between the hours of 8:30 a.m. and 4:30 p.m.  Voicemails left after 4:30 p.m. will not be returned until the following business day.  For prescription refill requests, have your pharmacy contact our office.

## 2014-09-09 LAB — CANCER ANTIGEN 27.29: CA 27.29: 20.2 U/mL (ref 0.0–38.6)

## 2014-09-09 LAB — CEA: CEA: 1.2 ng/mL (ref 0.0–4.7)

## 2014-10-07 ENCOUNTER — Other Ambulatory Visit (HOSPITAL_COMMUNITY): Payer: Self-pay | Admitting: Family Medicine

## 2014-10-07 DIAGNOSIS — Z1231 Encounter for screening mammogram for malignant neoplasm of breast: Secondary | ICD-10-CM

## 2014-11-01 ENCOUNTER — Other Ambulatory Visit (HOSPITAL_COMMUNITY): Payer: Medicare Other

## 2014-11-05 ENCOUNTER — Ambulatory Visit (HOSPITAL_COMMUNITY)
Admission: RE | Admit: 2014-11-05 | Discharge: 2014-11-05 | Disposition: A | Discharge status: Home / Self Care | Payer: Medicare Other | Source: Ambulatory Visit | Attending: Family Medicine | Admitting: Family Medicine

## 2014-11-05 DIAGNOSIS — Z1231 Encounter for screening mammogram for malignant neoplasm of breast: Secondary | ICD-10-CM | POA: Diagnosis not present

## 2014-11-15 ENCOUNTER — Ambulatory Visit (HOSPITAL_COMMUNITY)
Admission: RE | Admit: 2014-11-15 | Discharge: 2014-11-15 | Disposition: A | Discharge status: Home / Self Care | Payer: Medicare Other | Source: Ambulatory Visit | Attending: Oncology | Admitting: Oncology

## 2014-11-15 DIAGNOSIS — Z780 Asymptomatic menopausal state: Secondary | ICD-10-CM | POA: Insufficient documentation

## 2014-11-15 DIAGNOSIS — M810 Age-related osteoporosis without current pathological fracture: Secondary | ICD-10-CM | POA: Diagnosis not present

## 2014-11-16 DIAGNOSIS — H3531 Nonexudative age-related macular degeneration: Secondary | ICD-10-CM | POA: Diagnosis not present

## 2014-11-16 DIAGNOSIS — Z961 Presence of intraocular lens: Secondary | ICD-10-CM | POA: Diagnosis not present

## 2014-11-16 DIAGNOSIS — H35341 Macular cyst, hole, or pseudohole, right eye: Secondary | ICD-10-CM | POA: Diagnosis not present

## 2015-01-11 DIAGNOSIS — E233 Hypothalamic dysfunction, not elsewhere classified: Secondary | ICD-10-CM | POA: Diagnosis not present

## 2015-01-11 DIAGNOSIS — N183 Chronic kidney disease, stage 3 (moderate): Secondary | ICD-10-CM | POA: Diagnosis not present

## 2015-01-11 DIAGNOSIS — Z1389 Encounter for screening for other disorder: Secondary | ICD-10-CM | POA: Diagnosis not present

## 2015-01-11 DIAGNOSIS — Z0000 Encounter for general adult medical examination without abnormal findings: Secondary | ICD-10-CM | POA: Diagnosis not present

## 2015-01-11 DIAGNOSIS — Z6820 Body mass index (BMI) 20.0-20.9, adult: Secondary | ICD-10-CM | POA: Diagnosis not present

## 2015-01-11 DIAGNOSIS — I10 Essential (primary) hypertension: Secondary | ICD-10-CM | POA: Diagnosis not present

## 2015-01-11 DIAGNOSIS — Z23 Encounter for immunization: Secondary | ICD-10-CM | POA: Diagnosis not present

## 2015-01-11 DIAGNOSIS — E782 Mixed hyperlipidemia: Secondary | ICD-10-CM | POA: Diagnosis not present

## 2015-01-11 DIAGNOSIS — D473 Essential (hemorrhagic) thrombocythemia: Secondary | ICD-10-CM | POA: Diagnosis not present

## 2015-01-11 DIAGNOSIS — R5383 Other fatigue: Secondary | ICD-10-CM | POA: Diagnosis not present

## 2015-03-10 ENCOUNTER — Ambulatory Visit (HOSPITAL_COMMUNITY): Payer: Medicare Other

## 2015-03-10 ENCOUNTER — Encounter (HOSPITAL_COMMUNITY): Payer: Medicare Other

## 2015-03-11 ENCOUNTER — Encounter (HOSPITAL_COMMUNITY): Payer: Self-pay

## 2015-03-11 ENCOUNTER — Encounter (HOSPITAL_COMMUNITY): Payer: Medicare Other | Attending: Hematology & Oncology

## 2015-03-11 ENCOUNTER — Encounter (HOSPITAL_BASED_OUTPATIENT_CLINIC_OR_DEPARTMENT_OTHER): Payer: Medicare Other

## 2015-03-11 VITALS — BP 145/49 | HR 59 | Temp 97.9°F | Resp 18

## 2015-03-11 DIAGNOSIS — M810 Age-related osteoporosis without current pathological fracture: Secondary | ICD-10-CM

## 2015-03-11 LAB — COMPREHENSIVE METABOLIC PANEL
ALT: 11 U/L — AB (ref 14–54)
AST: 18 U/L (ref 15–41)
Albumin: 4.3 g/dL (ref 3.5–5.0)
Alkaline Phosphatase: 39 U/L (ref 38–126)
Anion gap: 6 (ref 5–15)
BUN: 20 mg/dL (ref 6–20)
CHLORIDE: 107 mmol/L (ref 101–111)
CO2: 27 mmol/L (ref 22–32)
Calcium: 9.2 mg/dL (ref 8.9–10.3)
Creatinine, Ser: 1.05 mg/dL — ABNORMAL HIGH (ref 0.44–1.00)
GFR calc Af Amer: 60 mL/min — ABNORMAL LOW (ref >60)
GFR calc non Af Amer: 51 mL/min — ABNORMAL LOW (ref >60)
GLUCOSE: 86 mg/dL (ref 65–99)
Potassium: 4.6 mmol/L (ref 3.5–5.1)
SODIUM: 140 mmol/L (ref 135–145)
Total Bilirubin: 0.7 mg/dL (ref 0.3–1.2)
Total Protein: 6.9 g/dL (ref 6.5–8.1)

## 2015-03-11 MED ORDER — DENOSUMAB 60 MG/ML ~~LOC~~ SOLN
60.0000 mg | Freq: Once | SUBCUTANEOUS | Status: AC
  Administered 2015-03-11: 60 mg via SUBCUTANEOUS
  Filled 2015-03-11: qty 1

## 2015-03-11 NOTE — Progress Notes (Signed)
Labs drawn

## 2015-03-11 NOTE — Patient Instructions (Signed)
[place] at [facility] Discharge Instructions  RECOMMENDATIONS MADE BY THE CONSULTANT AND ANY TEST RESULTS WILL BE SENT TO YOUR REFERRING PHYSICIAN.  Lab work and Prolia injection today. Return as scheduled in 6 months for lab work, office visit, and injection.   Thank you for choosing [place] at [facility] to provide your oncology and hematology care.  To afford each patient quality time with our provider, please arrive at least 15 minutes before your scheduled appointment time.    You need to re-schedule your appointment should you arrive 10 or more minutes late.  We strive to give you quality time with our providers, and arriving late affects you and other patients whose appointments are after yours.  Also, if you no show three or more times for appointments you may be dismissed from the clinic at the providers discretion.     Again, thank you for choosing [facility].  Our hope is that these requests will decrease the amount of time that you wait before being seen by our physicians.       _____________________________________________________________  Should you have questions after your visit to [facility], please contact our office at [phone] between the hours of 8:30 a.m. and 4:30 p.m.  Voicemails left after 4:30 p.m. will not be returned until the following business day.  For prescription refill requests, have your pharmacy contact our office.

## 2015-03-11 NOTE — Progress Notes (Signed)
Patricia Kline presents today for injection per the provider's orders.  Prolia administration without incident; see MAR for injection details.  Patient tolerated procedure well and without incident.  No questions or complaints noted at this time.

## 2015-03-12 LAB — VITAMIN D 25 HYDROXY (VIT D DEFICIENCY, FRACTURES): VIT D 25 HYDROXY: 14.3 ng/mL — AB (ref 30.0–100.0)

## 2015-03-13 ENCOUNTER — Other Ambulatory Visit (HOSPITAL_COMMUNITY): Payer: Self-pay | Admitting: Oncology

## 2015-03-13 DIAGNOSIS — E559 Vitamin D deficiency, unspecified: Secondary | ICD-10-CM

## 2015-03-13 MED ORDER — ERGOCALCIFEROL 1.25 MG (50000 UT) PO CAPS: 50000.0000 [IU] | ORAL_CAPSULE | ORAL | Status: DC

## 2015-03-14 ENCOUNTER — Other Ambulatory Visit (HOSPITAL_COMMUNITY): Payer: Self-pay | Admitting: Emergency Medicine

## 2015-03-14 ENCOUNTER — Telehealth (HOSPITAL_COMMUNITY): Payer: Self-pay | Admitting: Emergency Medicine

## 2015-03-14 NOTE — Telephone Encounter (Signed)
Pt notified, pt states that she does not want to take Vitamin D that it makes her sick, Labs faxed to Dr Cornelia Copa office

## 2015-03-14 NOTE — Telephone Encounter (Signed)
-----   Message from [facility], PA-C sent at 03/13/2015 10:44 AM EST ----- Vit D 50,000 units weekly x 12 weeks (escribed), then she is to take 1000-2000 units daily thereafter.

## 2015-05-23 DIAGNOSIS — I10 Essential (primary) hypertension: Secondary | ICD-10-CM | POA: Diagnosis not present

## 2015-05-23 DIAGNOSIS — E782 Mixed hyperlipidemia: Secondary | ICD-10-CM | POA: Diagnosis not present

## 2015-05-23 DIAGNOSIS — Z6820 Body mass index (BMI) 20.0-20.9, adult: Secondary | ICD-10-CM | POA: Diagnosis not present

## 2015-05-23 DIAGNOSIS — Z1389 Encounter for screening for other disorder: Secondary | ICD-10-CM | POA: Diagnosis not present

## 2015-07-14 DIAGNOSIS — J329 Chronic sinusitis, unspecified: Secondary | ICD-10-CM | POA: Diagnosis not present

## 2015-07-14 DIAGNOSIS — Z1389 Encounter for screening for other disorder: Secondary | ICD-10-CM | POA: Diagnosis not present

## 2015-07-14 DIAGNOSIS — R070 Pain in throat: Secondary | ICD-10-CM | POA: Diagnosis not present

## 2015-07-14 DIAGNOSIS — J343 Hypertrophy of nasal turbinates: Secondary | ICD-10-CM | POA: Diagnosis not present

## 2015-07-14 DIAGNOSIS — R6889 Other general symptoms and signs: Secondary | ICD-10-CM | POA: Diagnosis not present

## 2015-07-14 DIAGNOSIS — J040 Acute laryngitis: Secondary | ICD-10-CM | POA: Diagnosis not present

## 2015-07-14 DIAGNOSIS — Z6820 Body mass index (BMI) 20.0-20.9, adult: Secondary | ICD-10-CM | POA: Diagnosis not present

## 2015-09-05 DIAGNOSIS — M1991 Primary osteoarthritis, unspecified site: Secondary | ICD-10-CM | POA: Diagnosis not present

## 2015-09-05 DIAGNOSIS — Z681 Body mass index (BMI) 19 or less, adult: Secondary | ICD-10-CM | POA: Diagnosis not present

## 2015-09-05 DIAGNOSIS — M2550 Pain in unspecified joint: Secondary | ICD-10-CM | POA: Diagnosis not present

## 2015-09-05 DIAGNOSIS — Z1389 Encounter for screening for other disorder: Secondary | ICD-10-CM | POA: Diagnosis not present

## 2015-09-05 DIAGNOSIS — E538 Deficiency of other specified B group vitamins: Secondary | ICD-10-CM | POA: Diagnosis not present

## 2015-09-06 ENCOUNTER — Encounter (HOSPITAL_COMMUNITY): Payer: Medicare Other | Attending: Hematology & Oncology

## 2015-09-06 ENCOUNTER — Ambulatory Visit (HOSPITAL_COMMUNITY): Payer: Medicare Other | Admitting: Oncology

## 2015-09-06 ENCOUNTER — Encounter (HOSPITAL_COMMUNITY): Payer: Medicare Other

## 2015-09-06 ENCOUNTER — Encounter (HOSPITAL_COMMUNITY): Payer: Self-pay | Admitting: Lab

## 2015-09-06 ENCOUNTER — Encounter (HOSPITAL_BASED_OUTPATIENT_CLINIC_OR_DEPARTMENT_OTHER): Payer: Medicare Other | Admitting: Oncology

## 2015-09-06 ENCOUNTER — Ambulatory Visit (HOSPITAL_COMMUNITY): Payer: Medicare Other | Admitting: Hematology & Oncology

## 2015-09-06 ENCOUNTER — Encounter (HOSPITAL_COMMUNITY): Payer: Self-pay | Admitting: Oncology

## 2015-09-06 VITALS — BP 147/54 | HR 55 | Temp 97.4°F | Resp 16 | Wt 112.2 lb

## 2015-09-06 DIAGNOSIS — E559 Vitamin D deficiency, unspecified: Secondary | ICD-10-CM

## 2015-09-06 DIAGNOSIS — C50911 Malignant neoplasm of unspecified site of right female breast: Secondary | ICD-10-CM

## 2015-09-06 DIAGNOSIS — Z85038 Personal history of other malignant neoplasm of large intestine: Secondary | ICD-10-CM | POA: Diagnosis not present

## 2015-09-06 DIAGNOSIS — Z9889 Other specified postprocedural states: Secondary | ICD-10-CM | POA: Insufficient documentation

## 2015-09-06 DIAGNOSIS — M810 Age-related osteoporosis without current pathological fracture: Secondary | ICD-10-CM | POA: Diagnosis not present

## 2015-09-06 DIAGNOSIS — Z0000 Encounter for general adult medical examination without abnormal findings: Secondary | ICD-10-CM

## 2015-09-06 DIAGNOSIS — Z803 Family history of malignant neoplasm of breast: Secondary | ICD-10-CM | POA: Insufficient documentation

## 2015-09-06 DIAGNOSIS — I10 Essential (primary) hypertension: Secondary | ICD-10-CM | POA: Insufficient documentation

## 2015-09-06 DIAGNOSIS — Z9221 Personal history of antineoplastic chemotherapy: Secondary | ICD-10-CM | POA: Insufficient documentation

## 2015-09-06 DIAGNOSIS — C187 Malignant neoplasm of sigmoid colon: Secondary | ICD-10-CM

## 2015-09-06 DIAGNOSIS — D649 Anemia, unspecified: Secondary | ICD-10-CM | POA: Diagnosis not present

## 2015-09-06 DIAGNOSIS — Z808 Family history of malignant neoplasm of other organs or systems: Secondary | ICD-10-CM | POA: Diagnosis not present

## 2015-09-06 DIAGNOSIS — Z823 Family history of stroke: Secondary | ICD-10-CM | POA: Diagnosis not present

## 2015-09-06 DIAGNOSIS — Z90710 Acquired absence of both cervix and uterus: Secondary | ICD-10-CM | POA: Insufficient documentation

## 2015-09-06 DIAGNOSIS — E7800 Pure hypercholesterolemia, unspecified: Secondary | ICD-10-CM | POA: Insufficient documentation

## 2015-09-06 DIAGNOSIS — Z853 Personal history of malignant neoplasm of breast: Secondary | ICD-10-CM | POA: Diagnosis not present

## 2015-09-06 LAB — CBC WITH DIFFERENTIAL/PLATELET
BASOS ABS: 0 10*3/uL (ref 0.0–0.1)
Basophils Relative: 0 %
EOS PCT: 0 %
Eosinophils Absolute: 0 10*3/uL (ref 0.0–0.7)
HCT: 33.5 % — ABNORMAL LOW (ref 36.0–46.0)
Hemoglobin: 11.1 g/dL — ABNORMAL LOW (ref 12.0–15.0)
LYMPHS ABS: 1.1 10*3/uL (ref 0.7–4.0)
Lymphocytes Relative: 17 %
MCH: 30.4 pg (ref 26.0–34.0)
MCHC: 33.1 g/dL (ref 30.0–36.0)
MCV: 91.8 fL (ref 78.0–100.0)
Monocytes Absolute: 0.5 10*3/uL (ref 0.1–1.0)
Monocytes Relative: 7 %
NEUTROS PCT: 76 %
Neutro Abs: 5 10*3/uL (ref 1.7–7.7)
PLATELETS: 181 10*3/uL (ref 150–400)
RBC: 3.65 MIL/uL — AB (ref 3.87–5.11)
RDW: 15 % (ref 11.5–15.5)
WBC: 6.6 10*3/uL (ref 4.0–10.5)

## 2015-09-06 LAB — COMPREHENSIVE METABOLIC PANEL
ALT: 16 U/L (ref 14–54)
AST: 19 U/L (ref 15–41)
Albumin: 4.6 g/dL (ref 3.5–5.0)
Alkaline Phosphatase: 32 U/L — ABNORMAL LOW (ref 38–126)
Anion gap: 6 (ref 5–15)
BUN: 19 mg/dL (ref 6–20)
CHLORIDE: 104 mmol/L (ref 101–111)
CO2: 28 mmol/L (ref 22–32)
CREATININE: 1.02 mg/dL — AB (ref 0.44–1.00)
Calcium: 9.3 mg/dL (ref 8.9–10.3)
GFR calc non Af Amer: 53 mL/min — ABNORMAL LOW (ref >60)
GLUCOSE: 87 mg/dL (ref 65–99)
Potassium: 3.9 mmol/L (ref 3.5–5.1)
SODIUM: 138 mmol/L (ref 135–145)
Total Bilirubin: 0.6 mg/dL (ref 0.3–1.2)
Total Protein: 7.3 g/dL (ref 6.5–8.1)

## 2015-09-06 MED ORDER — DENOSUMAB 60 MG/ML ~~LOC~~ SOLN
60.0000 mg | Freq: Once | SUBCUTANEOUS | Status: AC
  Administered 2015-09-06: 60 mg via SUBCUTANEOUS
  Filled 2015-09-06: qty 1

## 2015-09-06 NOTE — Patient Instructions (Signed)
[place] at [facility] Discharge Instructions  RECOMMENDATIONS MADE BY THE CONSULTANT AND ANY TEST RESULTS WILL BE SENT TO YOUR REFERRING PHYSICIAN.  Refer to Dr. Merlene Laughter for nerve conduction study --progressive peripheral neuropathy  Prolia injection today  Labs in 8 weeks and return to see Dr. Whitney Muse in 8 weeks  Labs in 6 months with Prolia in 6 months  Labs in 12 months with Prolia in 12 months and Return to see Dr. Whitney Muse in 12 months  Thank you for choosing [place] at [facility] to provide your oncology and hematology care.  To afford each patient quality time with our provider, please arrive at least 15 minutes before your scheduled appointment time.   Beginning January 23rd 2017 lab work for the [facility] will be done in the  Main lab at [facility] on 1st floor. If you have a lab appointment with the [facility] please come in thru the  Main Entrance and check in at the main information desk  You need to re-schedule your appointment should you arrive 10 or more minutes late.  We strive to give you quality time with our providers, and arriving late affects you and other patients whose appointments are after yours.  Also, if you no show three or more times for appointments you may be dismissed from the clinic at the providers discretion.     Again, thank you for choosing [facility].  Our hope is that these requests will decrease the amount of time that you wait before being seen by our physicians.       _____________________________________________________________  Should you have questions after your visit to [facility], please contact our office at [phone] between the hours of 8:30 a.m. and 4:30 p.m.  Voicemails left after 4:30 p.m. will not be returned until the following business day.  For prescription refill requests, have your pharmacy contact our office.         Resources For  Cancer Patients and their Caregivers ? American Cancer Society: Can assist with transportation, wigs, general needs, runs Look Good Feel Better.        (949)807-8169 ? Cancer Care: Provides financial assistance, online support groups, medication/co-pay assistance.  1-800-813-HOPE 925 344 9378) ? [facility] Assists [place] cancer patients and their families through emotional , educational and financial support.  269-029-3381 ? [place] DSS Where to apply for food stamps, Medicaid and utility assistance. 682-819-3208 ? [facility]: Transportation to medical appointments. 435-001-9401 ? Social Security Administration: May apply for disability if have a Stage IV cancer. 806-361-3525 (279)360-4833 ? [employer], Disability and Transit Services: Assists with nutrition, care and transit needs. [place] Support Programs: @10RELATIVEDAYS @ > Cancer Support Group  2nd Tuesday of the month 1pm-2pm, Journey Room  > Creative Journey  3rd Tuesday of the month 1130am-1pm, Journey Room  > Look Good Feel Better  1st Wednesday of the month 10am-12 noon, Journey Room (Call [facility] to register [phone])

## 2015-09-06 NOTE — Progress Notes (Signed)
Patricia Kilts, MD [place] [address]  Invasive ductal carcinoma of right breast [facility]) - Plan: MM DIAG BREAST TOMO BILATERAL, CBC with Differential, Comprehensive metabolic panel, Cancer antigen 27.29  Vitamin D deficiency - Plan: Vitamin D 25 hydroxy, Vitamin D 25 hydroxy  Adenocarcinoma of sigmoid colon (HCC) - Plan: CBC with Differential, Comprehensive metabolic panel  Osteoporosis, senile - Plan: SCHEDULING COMMUNICATION, denosumab (PROLIA) injection 60 mg, Comprehensive metabolic panel, Comprehensive metabolic panel  Preventative health care - Plan: MM DIAG BREAST TOMO BILATERAL  Anemia, unspecified anemia type - Plan: CBC, Vitamin B12, Folate, Iron and TIBC, Ferritin, Reticulocytes  CURRENT THERAPY: Surveillance per NCCN guidelines and Prolia 60 mg every 6 months (last given on 03/13/2013)  INTERVAL HISTORY: Patricia Kline 75 y.o. female returns for followup of Stage III adenocarcinoma sigmoid colon status post resection on 05/29/2004 for is 6.0 cm cancer with 6 of 15 positive lymph nodes. We gave her oxaliplatin and capecitabine adjuvantly for 6 cycles, thus far without recurrence. Negative colonoscopy in May 2010 and is due for another one in June 2020 according to Dr. Gala Romney.  AND  Stage I (T1 C. N0 M0) invasive ductal carcinoma of the right breast, ER 89% positive, PR 10% positive, HER-2/neu nonamplified, Ki-67 marker 16% status post surgical resection on 12/20/2004 after possibly 4 months of adjuvant Arimidex. The cancer was 2.0 cm pathologically, status post lumpectomy, sentinel node biopsy followed by radiation therapy followed by adjuvant Arimidex for 5 years. She cannot tolerate Arimidex so we switched her to tamoxifen. We had tried letrozole also but that was too costly for her. She therefore had stage II disease clinically at presentation and by MRI, but was reduced to stage I at the time of surgery by the adjuvant hormonal  therapy. AND Osteoporosis, on Prolia 60 mg beginning on 03/05/2011.    Invasive ductal carcinoma of right breast ([place])   07/19/2004 Imaging PET- increased uptake in posterior aspect of right breast   08/09/2004 Imaging Mammogram and [place]   08/09/2004 Initial Diagnosis Invasive ductal carcinoma of right breast on needle core biopsy of right breast   08/22/2004 Imaging MRI B/L Breast- 1.8 x 1.4 x 1.3 cm right breast mass   08/29/2004 - 12/13/2004 Chemotherapy Arimidex x 4 months neoadjuvantly- 77% shrinkage by volume size   12/20/2004 Surgery Right partial mastectomy- 2 cm, ER 89%, PR 10%, Her2 negative, Ki-67 16%.     02/06/2005 - 03/28/2005 Radiation Therapy By Dr. Elba Barman   05/15/2005 - 05/06/2007 Chemotherapy Femara   08/29/2005 Imaging Mammogram s/p chemo and resection.  BIRADS 2   05/06/2007 - 09/06/2009 Chemotherapy Tamoxifen    Adenocarcinoma of sigmoid colon ([place])   05/29/2004 Initial Diagnosis Adenocarcinoma of sigmoid colon   05/29/2004 Surgery Segmental resection, 6 cm maximum size, adenocarcinoma, 6/15 lymph nodes positive for disease, T3N2   07/25/2004 - 11/15/2004 Chemotherapy Xeloda + Oxaliplatin, Avastin x 6 cycles.  Avastin d/c after cycle 1    Remission     I personally reviewed and went over laboratory results with the patient.  The results are noted within this dictation.  I personally reviewed and went over radiographic studies with the patient.  The results are noted within this dictation.  She is due for mammogram in August 2017.   ROS  Past Medical History  Diagnosis Date  . Breast CA ([place]) 11/14/2010  . Colon cancer ([place]) 11/14/2010  . Hypertension   . Osteoporosis   . Hypercholesteremia   .  Arthritis   . Peripheral neuropathy ([place])   . Depression   . Mitral valve prolapse   . Osteoporosis, senile 03/05/2011  . H/O: hysterectomy   . History of right knee surgery     for bone cancer right knee  . S/P cataract surgery     bil eyes  . Falls   . 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 Invasive ductal carcinoma of right breast ([place]) 11/14/2010  . Adenocarcinoma of sigmoid colon ([place]) 11/14/2010    Past Surgical History  Procedure Laterality Date  . Cataract extraction, bilateral    . Retinal hole repair      bilateral  . Abdominal hysterectomy    . Tonsillectomy    . Knee surgery    . Epidural steroid injection with kenalog      Midline  . Port-a-cath removal  02/04/2012    Procedure: REMOVAL PORT-A-CATH;  Surgeon: Donato Heinz, MD;  Location: [facility] ORS;  Service: General;  Laterality: N/A;  minor room  . Yag laser application Left 0/31/5945    Procedure: YAG LASER APPLICATION;  Surgeon: Elta Guadeloupe T. Gershon Crane, MD;  Location: [facility] ORS;  Service: Ophthalmology;  Laterality: Left;  . Colonoscopy  08/27/2008    Dr. Madolyn Frieze papilla.  Surgical anastomosis at 8-10 cm from the anal verge.  Residual rectal and colonic mucosa appeared normal.  . Colonoscopy N/A 08/31/2013    Procedure: COLONOSCOPY;  Surgeon: Daneil Dolin, MD;  Location: [facility] ENDO SUITE;  Service: Endoscopy;  Laterality: N/A;  2:45    Family History  Problem Relation Age of Onset  . Stroke Mother   . Cancer Mother     breast  cancer  . Heart attack Father   . Colon cancer Neg Hx     Social History   Social History  . Marital Status: Married    Spouse Name: N/A  . Number of Children: N/A  . Years of Education: N/A   Social History Main Topics  . Smoking status: Never Smoker   . Smokeless tobacco: Never Used  . Alcohol Use: No  . Drug Use: No  . Sexual Activity: No   Other Topics Concern  . None   Social History Narrative     PHYSICAL EXAMINATION  ECOG PERFORMANCE STATUS: 0 - Asymptomatic  Filed Vitals:   09/06/15 1035  BP: 147/54  Pulse: 55  Temp: 97.4 F (36.3 C)  Resp: 16    GENERAL:alert, healthy, no distress, well nourished, well developed, comfortable, cooperative, smiling and unaccompanied SKIN: skin color, texture, turgor are  normal, no rashes or significant lesions HEAD: Normocephalic, No masses, lesions, tenderness or abnormalities EYES: normal, EOMI, Conjunctiva are pink and non-injected EARS: External ears normal OROPHARYNX:lips, buccal mucosa, and tongue normal and mucous membranes are moist  NECK: supple, trachea midline LYMPH:  not examined BREAST:not examined LUNGS: clear to auscultation  HEART: regular rate & rhythm ABDOMEN:abdomen soft and normal bowel sounds BACK: Back symmetric, no curvature. EXTREMITIES:less then 2 second capillary refill, no joint deformities, effusion, or inflammation, no edema, no skin discoloration, no cyanosis  NEURO: alert & oriented x 3 with fluent speech, no focal motor/sensory deficits, gait normal   LABORATORY DATA: CBC    Component Value Date/Time   WBC 6.6 09/06/2015 1004   RBC 3.65* 09/06/2015 1004   HGB 11.1* 09/06/2015 1004   HCT 33.5* 09/06/2015 1004   PLT 181 09/06/2015 1004   MCV 91.8 09/06/2015 1004   MCH  30.4 09/06/2015 1004   MCHC 33.1 09/06/2015 1004   RDW 15.0 09/06/2015 1004   LYMPHSABS 1.1 09/06/2015 1004   MONOABS 0.5 09/06/2015 1004   EOSABS 0.0 09/06/2015 1004   BASOSABS 0.0 09/06/2015 1004      Chemistry      Component Value Date/Time   NA 138 09/06/2015 1004   K 3.9 09/06/2015 1004   CL 104 09/06/2015 1004   CO2 28 09/06/2015 1004   BUN 19 09/06/2015 1004   CREATININE 1.02* 09/06/2015 1004      Component Value Date/Time   CALCIUM 9.3 09/06/2015 1004   ALKPHOS 32* 09/06/2015 1004   AST 19 09/06/2015 1004   ALT 16 09/06/2015 1004   BILITOT 0.6 09/06/2015 1004     Lab Results  Component Value Date   LABCA2 20.2 09/08/2014     PENDING LABS:   RADIOGRAPHIC STUDIES:  No results found.   PATHOLOGY:    ASSESSMENT AND PLAN:  Invasive ductal carcinoma of right breast Stage I (T1 C. N0 M0) invasive ductal carcinoma of the right breast, ER 89% positive, PR 10% positive, HER-2/neu nonamplified, Ki-67 marker 16% status  post surgical resection on 12/20/2004 after 4 months of adjuvant Arimidex. The cancer was 2.0 cm pathologically, status post lumpectomy, sentinel node biopsy followed by radiation therapy followed by adjuvant Arimidex for 5 years. She could not tolerate Arimidex so we switched her to tamoxifen. We had tried letrozole also but that was too costly for her. She therefore had stage II disease clinically at presentation and by MRI, but was reduced to stage I at the time of surgery by the adjuvant hormonal therapy.   Labs today: CBC diff, CMET, CA 27.29, Vit D.  Mammogram is due in August 2017.  Order is placed.  We will send a copy of lab work from today to Dr. Hilma Favors.  She notes difficulty with sleeping.  She wakes up around 4 AM, urinates, and then cannot go back to sleep.  She lays down for bed at 11:30 PM.  She is advised to follow-up with Dr. Hilma Favors regarding this issue. HOWEVER, the patient is provided some over the counter recommendations including Benadryl by mouth, Tylenol PM, melatonin, or Valerian root.  She notes low back pain which has been deemed to be secondary to "arthritis."  She received an injection for this pain yesterday at Dr. Delanna Ahmadi office.  She continues with tingling in her fingers and toes.  She notes that it is progressive.  I suspect it is chemotherapy-induced, but it is peculiar that she notes progression. She did receive Oxaliplatin-based chemotherapy for her adenocarcinoma of sigmoid colon in 2006. She has been using gabapentin and she is taking 1200 mg of this. It is prescribed by her primary care physician. She is no history of diabetes. She denies any new medications. I think at this point in time it would be wise to perform a nerve conduction study to further evaluate this particularly in light of progression of disease and being more than 10 years out from chemotherapy. There certainly is a risk that this is oxaliplatin induced peripheral neuropathy. Referral made to  neurology for nerve conduction testing.  She will return in 2 months for follow-up and review of nerve conduction tests results. She will undergo laboratory testing in 2 months consisting of CBC with differential, anemia panel, reticulocyte count.  She is mildly anemic and this is the reason for anemia panel in addition to reticulocyte count. Given her progressive neuropathy, is imperative  to rule out folate and B12 deficiency. This will be covered in the anemia panel.  Assuming that workup for progressive neuropathy is chemotherapy-induced, I will preemptively get her scheduled for laboratory work in 6 and 12 months in addition to [place] in 6 and 12 months. Additionally, I will get her scheduled for a follow-up appointment in one year. This will include laboratory work. Moving forward, we really need to discuss with the patient the role of CA-27-29 testing. This cancer Marker is not indicated for surveillance in this particular circumstance.  As a result, this will need to be discussed in follow-up. Unfortunately, it's been difficult following recommended guidelines for laboratory work with this patient due to her previous medical oncologist performing regular CEA and CA-27-29 testing for many years. I recently was able to convince the patient that further CEA testing is not indicated. This was difficult to get the patient on board with. However, guidelines do not recommend further CEA testing or CA-27-29 testing. For the time being, I will order a CA-27-29 for 1 year and this can be deleted based upon conversation with the patient in 2 months time.  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 We gave her oxaliplatin and capecitabine adjuvantly for 6 cycles, thus far without recurrence. Negative colonoscopy in June 2015 and is due for another one in June 2020 according to Dr. Sydell Axon.    Labs today: CBC diff, CMET, CEA.  Labs in 1 year: CBC diff, CMET, CEA.  Return in 1 year for follow-up.    Osteoporosis, senile Bone density in April 2014 demonstrates stable and slightly improved bone density.  Taking Prolia every 6 months without Ca++ and Vit D supplements due to intolerance with muscle cramping.  She is encouraged to eat food products rich in calcium.   Due for Prolia injection today.  Labs today: CMET  Labs in 6 months: CMET  Labs in 12 months: CMET.  Prolia injection in 6 months and 12 months.  Bone density is due in September 2018.  Order is placed.  Return in 12 months.    ORDERS PLACED FOR THIS ENCOUNTER: Orders Placed This Encounter  Procedures  . MM DIAG BREAST TOMO BILATERAL  . Vitamin D 25 hydroxy  . CBC  . Vitamin B12  . Folate  . Iron and TIBC  . Ferritin  . Reticulocytes  . CBC with Differential  . Comprehensive metabolic panel  . Cancer antigen 27.29  . Vitamin D 25 hydroxy  . Comprehensive metabolic panel  . SCHEDULING COMMUNICATION    MEDICATIONS PRESCRIBED THIS ENCOUNTER: Meds ordered this encounter  Medications  . denosumab (PROLIA) injection 60 mg    Sig:     THERAPY PLAN:  5 years worth of surveillance are complete from a colon cancer standpoint. Therefore, NCCN guidelines does not recommend CEA monitoring or surveillance imaging.  From a breast cancer standpoint, we will continue with breast exams and perform annual screening mammograms. From an osteoporosis standpoint, we will continue with Prolia every 6 months as her most recent bone scan is improved. She is encouraged to continue Ca++ and Vit D supplementation via food products rich in these vitamins (as she reports intolerance to PO Ca++ and Vit D).  All questions were answered. The patient knows to call the clinic with any problems, questions or concerns. We can certainly see the patient much sooner if necessary.  Patient  and plan discussed with Dr. Ancil Linsey and she is in agreement with the aforementioned.   This note is electronically signed by: Doy Mince 09/06/2015 6:48 PM

## 2015-09-06 NOTE — Assessment & Plan Note (Signed)
Stage III adenocarcinoma sigmoid colon status post resection on 05/29/2004 for this 6.0 cm cancer with 6 of 15 positive lymph nodes. We gave her oxaliplatin and capecitabine adjuvantly for 6 cycles, thus far without recurrence. Negative colonoscopy in June 2015 and is due for another one in June 2020 according to Dr. Sydell Axon.   Labs today: CBC diff, CMET, CEA.  Labs in 1 year: CBC diff, CMET, CEA.  Return in 1 year for follow-up.

## 2015-09-06 NOTE — Progress Notes (Signed)
Referral sent to Dr Merlene Laughter  Records faxed on 6/6

## 2015-09-06 NOTE — Assessment & Plan Note (Signed)
Bone density in April 2014 demonstrates stable and slightly improved bone density.  Taking Prolia every 6 months without Ca++ and Vit D supplements due to intolerance with muscle cramping.  She is encouraged to eat food products rich in calcium.   Due for Prolia injection today.  Labs today: CMET  Labs in 6 months: CMET  Labs in 12 months: CMET.  Prolia injection in 6 months and 12 months.  Bone density is due in September 2018.  Order is placed.  Return in 12 months.

## 2015-09-06 NOTE — Assessment & Plan Note (Addendum)
Stage I (T1 C. 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 The cancer was 2.0 cm pathologically, status post lumpectomy, sentinel node biopsy followed by radiation therapy followed by adjuvant Arimidex for 5 years. She could not tolerate Arimidex so we switched her to tamoxifen. We had tried letrozole also but that was too costly for her. She therefore had stage II disease clinically at presentation and by MRI, but was reduced to stage I at the time of surgery by the adjuvant hormonal therapy.   Labs today: CBC diff, CMET, CA 27.29, Vit D.  Mammogram is due in August 2017.  Order is placed.  We will send a copy of lab work from today to Dr. Hilma Favors.  She notes difficulty with sleeping.  She wakes up around 4 AM, urinates, and then cannot go back to sleep.  She lays down for bed at 11:30 PM.  She is advised to follow-up with Dr. Hilma Favors regarding this issue. HOWEVER, the patient is provided some over the counter recommendations including Benadryl by mouth, Tylenol PM, melatonin, or Valerian root.  She notes low back pain which has been deemed to be secondary to "arthritis."  She received an injection for this pain yesterday at Dr. Delanna Ahmadi office.  She continues with tingling in her fingers and toes.  She notes that it is progressive.  I suspect it is chemotherapy-induced, but it is peculiar that she notes progression. She did receive Oxaliplatin-based chemotherapy for her adenocarcinoma of sigmoid colon in 2006. She has been using gabapentin and she is taking 1200 mg of this. It is prescribed by her primary care physician. She is no history of diabetes. She denies any new medications. I think at this point in time it would be wise to perform a nerve conduction study to further evaluate this particularly in light of progression of disease  and being more than 10 years out from chemotherapy. There certainly is a risk that this is oxaliplatin induced peripheral neuropathy. Referral made to neurology for nerve conduction testing.  She will return in 2 months for follow-up and review of nerve conduction tests results. She will undergo laboratory testing in 2 months consisting of CBC with differential, anemia panel, reticulocyte count.  She is mildly anemic and this is the reason for anemia panel in addition to reticulocyte count. Given her progressive neuropathy, is imperative to rule out folate and B12 deficiency. This will be covered in the anemia panel.  Assuming that workup for progressive neuropathy is chemotherapy-induced, I will preemptively get her scheduled for laboratory work in 6 and 12 months in addition to [place] in 6 and 12 months. Additionally, I will get her scheduled for a follow-up appointment in one year. This will include laboratory work. Moving forward, we really need to discuss with the patient the role of CA-27-29 testing. This cancer Marker is not indicated for surveillance in this particular circumstance.  As a result, this will need to be discussed in follow-up. Unfortunately, it's been difficult following recommended guidelines for laboratory work with this patient due to her previous medical oncologist performing regular CEA and CA-27-29 testing for many years. I recently was able to convince the patient that further CEA testing is not indicated. This was difficult to get the patient on board with. However, guidelines do not recommend further CEA testing or CA-27-29 testing. For the time being, I  will order a CA-27-29 for 1 year and this can be deleted based upon conversation with the patient in 2 months time.

## 2015-09-06 NOTE — Progress Notes (Signed)
Patricia Kline presents today for injection per MD orders. Prolia administered SQ in right Abdomen. Administration without incident. Patient tolerated well.

## 2015-09-07 LAB — CANCER ANTIGEN 27.29: CA 27.29: 16.2 U/mL (ref 0.0–38.6)

## 2015-09-07 LAB — VITAMIN D 25 HYDROXY (VIT D DEFICIENCY, FRACTURES): VIT D 25 HYDROXY: 16 ng/mL — AB (ref 30.0–100.0)

## 2015-09-07 LAB — CEA: CEA: 1.9 ng/mL (ref 0.0–4.7)

## 2015-10-14 ENCOUNTER — Other Ambulatory Visit (HOSPITAL_COMMUNITY): Payer: Self-pay | Admitting: Oncology

## 2015-10-14 DIAGNOSIS — Z1231 Encounter for screening mammogram for malignant neoplasm of breast: Secondary | ICD-10-CM

## 2015-11-01 ENCOUNTER — Encounter (HOSPITAL_COMMUNITY): Payer: Medicare Other

## 2015-11-01 ENCOUNTER — Encounter (HOSPITAL_COMMUNITY): Payer: Self-pay | Admitting: Hematology & Oncology

## 2015-11-01 ENCOUNTER — Encounter (HOSPITAL_COMMUNITY): Payer: Medicare Other | Attending: Hematology & Oncology | Admitting: Hematology & Oncology

## 2015-11-01 VITALS — BP 165/51 | HR 50 | Temp 97.4°F | Resp 16

## 2015-11-01 DIAGNOSIS — E7800 Pure hypercholesterolemia, unspecified: Secondary | ICD-10-CM | POA: Diagnosis not present

## 2015-11-01 DIAGNOSIS — Z85038 Personal history of other malignant neoplasm of large intestine: Secondary | ICD-10-CM | POA: Insufficient documentation

## 2015-11-01 DIAGNOSIS — Z9889 Other specified postprocedural states: Secondary | ICD-10-CM | POA: Diagnosis not present

## 2015-11-01 DIAGNOSIS — M810 Age-related osteoporosis without current pathological fracture: Secondary | ICD-10-CM

## 2015-11-01 DIAGNOSIS — Z803 Family history of malignant neoplasm of breast: Secondary | ICD-10-CM | POA: Diagnosis not present

## 2015-11-01 DIAGNOSIS — D649 Anemia, unspecified: Secondary | ICD-10-CM

## 2015-11-01 DIAGNOSIS — Z90710 Acquired absence of both cervix and uterus: Secondary | ICD-10-CM | POA: Diagnosis not present

## 2015-11-01 DIAGNOSIS — E559 Vitamin D deficiency, unspecified: Secondary | ICD-10-CM

## 2015-11-01 DIAGNOSIS — C50911 Malignant neoplasm of unspecified site of right female breast: Secondary | ICD-10-CM

## 2015-11-01 DIAGNOSIS — C187 Malignant neoplasm of sigmoid colon: Secondary | ICD-10-CM | POA: Diagnosis not present

## 2015-11-01 DIAGNOSIS — G6289 Other specified polyneuropathies: Secondary | ICD-10-CM

## 2015-11-01 DIAGNOSIS — Z9221 Personal history of antineoplastic chemotherapy: Secondary | ICD-10-CM | POA: Diagnosis not present

## 2015-11-01 DIAGNOSIS — I10 Essential (primary) hypertension: Secondary | ICD-10-CM | POA: Insufficient documentation

## 2015-11-01 DIAGNOSIS — Z853 Personal history of malignant neoplasm of breast: Secondary | ICD-10-CM | POA: Diagnosis not present

## 2015-11-01 DIAGNOSIS — Z808 Family history of malignant neoplasm of other organs or systems: Secondary | ICD-10-CM | POA: Diagnosis not present

## 2015-11-01 DIAGNOSIS — Z823 Family history of stroke: Secondary | ICD-10-CM | POA: Insufficient documentation

## 2015-11-01 LAB — CBC
HEMATOCRIT: 38.2 % (ref 36.0–46.0)
HEMOGLOBIN: 12.8 g/dL (ref 12.0–15.0)
MCH: 31.6 pg (ref 26.0–34.0)
MCHC: 33.5 g/dL (ref 30.0–36.0)
MCV: 94.3 fL (ref 78.0–100.0)
Platelets: 189 10*3/uL (ref 150–400)
RBC: 4.05 MIL/uL (ref 3.87–5.11)
RDW: 13.9 % (ref 11.5–15.5)
WBC: 6 10*3/uL (ref 4.0–10.5)

## 2015-11-01 LAB — IRON AND TIBC
Iron: 86 ug/dL (ref 28–170)
Saturation Ratios: 22 % (ref 10.4–31.8)
TIBC: 396 ug/dL (ref 250–450)
UIBC: 310 ug/dL

## 2015-11-01 LAB — RETICULOCYTES
RBC.: 4.05 MIL/uL (ref 3.87–5.11)
RETIC CT PCT: 1.5 % (ref 0.4–3.1)
Retic Count, Absolute: 60.8 10*3/uL (ref 19.0–186.0)

## 2015-11-01 LAB — FOLATE: FOLATE: 11 ng/mL (ref >5.9)

## 2015-11-01 LAB — FERRITIN: FERRITIN: 31 ng/mL (ref 11–307)

## 2015-11-01 LAB — VITAMIN B12: VITAMIN B 12: 200 pg/mL (ref 180–914)

## 2015-11-01 MED ORDER — ERGOCALCIFEROL 1.25 MG (50000 UT) PO CAPS: 50000.0000 [IU] | ORAL_CAPSULE | ORAL | 6 refills | Status: DC

## 2015-11-01 NOTE — Patient Instructions (Signed)
[place] at [facility] Discharge Instructions  RECOMMENDATIONS MADE BY THE CONSULTANT AND ANY TEST RESULTS WILL BE SENT TO YOUR REFERRING PHYSICIAN.  Exam done and seen today by Dr. Ancil Linsey Return to see the Doctor in 6 weeks Call the clinic for any concerns or questions.  Thank you for choosing [place] at [facility] to provide your oncology and hematology care.  To afford each patient quality time with our provider, please arrive at least 15 minutes before your scheduled appointment time.   Beginning January 23rd 2017 lab work for the [facility] will be done in the  Main lab at [facility] on 1st floor. If you have a lab appointment with the [facility] please come in thru the  Main Entrance and check in at the main information desk  You need to re-schedule your appointment should you arrive 10 or more minutes late.  We strive to give you quality time with our providers, and arriving late affects you and other patients whose appointments are after yours.  Also, if you no show three or more times for appointments you may be dismissed from the clinic at the providers discretion.     Again, thank you for choosing [facility].  Our hope is that these requests will decrease the amount of time that you wait before being seen by our physicians.       _____________________________________________________________  Should you have questions after your visit to [facility], please contact our office at [phone] between the hours of 8:30 a.m. and 4:30 p.m.  Voicemails left after 4:30 p.m. will not be returned until the following business day.  For prescription refill requests, have your pharmacy contact our office.         Resources For Cancer Patients and their Caregivers ? American Cancer Society: Can assist with transportation, wigs, general needs, runs Look Good Feel Better.         252-788-5552 ? Cancer Care: Provides financial assistance, online support groups, medication/co-pay assistance.  1-800-813-HOPE 7243239496) ? [facility] Assists [place] cancer patients and their families through emotional , educational and financial support.  (332)018-1144 ? [place] DSS Where to apply for food stamps, Medicaid and utility assistance. 9737826493 ? [facility]: Transportation to medical appointments. (856) 152-2428 ? Social Security Administration: May apply for disability if have a Stage IV cancer. 647-313-8386 978-356-5741 ? [employer], Disability and Transit Services: Assists with nutrition, care and transit needs. [place] Support Programs: @10RELATIVEDAYS @ > Cancer Support Group  2nd Tuesday of the month 1pm-2pm, Journey Room  > Creative Journey  3rd Tuesday of the month 1130am-1pm, Journey Room  > Look Good Feel Better  1st Wednesday of the month 10am-12 noon, Journey Room (Call [place] to register [phone])

## 2015-11-01 NOTE — Progress Notes (Signed)
Patricia Kilts, MD [address] [place] [address]    Invasive ductal carcinoma of right breast [facility])   07/19/2004 Imaging    PET- increased uptake in posterior aspect of right breast      08/09/2004 Imaging    Mammogram and [place]      08/09/2004 Initial Diagnosis    Invasive ductal carcinoma of right breast on needle core biopsy of right breast      08/22/2004 Imaging    MRI B/L Breast- 1.8 x 1.4 x 1.3 cm right breast mass      08/29/2004 - 12/13/2004 Chemotherapy    Arimidex x 4 months neoadjuvantly- 77% shrinkage by volume size      12/20/2004 Surgery    Right partial mastectomy- 2 cm, ER 89%, PR 10%, Her2 negative, Ki-67 16%.        02/06/2005 - 03/28/2005 Radiation Therapy    By Dr. Elba Barman      05/15/2005 - 05/06/2007 Chemotherapy    Femara      08/29/2005 Imaging    Mammogram s/p chemo and resection.  BIRADS 2      05/06/2007 - 09/06/2009 Chemotherapy    Tamoxifen       Adenocarcinoma of sigmoid colon ([facility])   05/29/2004 Initial Diagnosis    Adenocarcinoma of sigmoid colon      05/29/2004 Surgery    Segmental resection, 6 cm maximum size, adenocarcinoma, 6/15 lymph nodes positive for disease, T3N2      07/25/2004 - 11/15/2004 Chemotherapy    Xeloda + Oxaliplatin, Avastin x 6 cycles.  Avastin d/c after cycle 1       Remission          CURRENT THERAPY: Surveillance per NCCN guidelines and Prolia 60 mg every 6 months   INTERVAL HISTORY: Patricia Kline 75 y.o. female returns for followup of Stage III adenocarcinoma sigmoid colon status post resection on 05/29/2004 for is 6.0 cm cancer with 6 of 15 positive lymph nodes. We gave her oxaliplatin and capecitabine adjuvantly for 6 cycles, thus far without recurrence. Negative colonoscopy in May 2010 and is due for another one in June 2020 according to Dr. Gala Romney.  AND  Stage I (T1 C. 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 The cancer was 2.0 cm pathologically, status post lumpectomy, sentinel node biopsy followed by radiation therapy followed by adjuvant Arimidex for 5 years. She cannot tolerate Arimidex so we switched her to tamoxifen. We had tried letrozole also but that was too costly for her. She therefore had stage II disease clinically at presentation and by MRI, but was reduced to stage I at the time of surgery by the adjuvant hormonal therapy. AND Osteoporosis, on Prolia 60 mg beginning on 03/05/2011. I personally reviewed and went over laboratory results with the patient.  The results are noted within this dictation.  I personally reviewed and went over radiographic studies with the patient.  The results are noted within this dictation.  She is due for mammogram in August 2017.  Patricia Kline is unaccompanied.   She called about having a nerve conduction study done. The person who answered said they would consult with the physician to see if it would need to be performed and the patient never received a call back. She figured not receiving a call meant she  did not need the study to be performed.  She continues to experience burning in her hands and feet. She takes gabapentin 4 times daily which helps. She is not entirely sure if the medication agrees with her and has noticed feeling sleep after taking it.   Her husband is currently dealing with prostate issues and has a catheter in place so they have been working on this. She has been having to do a lot of work at home that she is not used to. She has also been taking care of her 51 year-old mother. "I have been mowing so much and I am just so fatigued". She later reports feeling unsteady due to the fatigue.   She reports discomfort in her right shoulder/axillary region, though she isn't sure if it is from radiation treatment.   She has noticed when she goes outside  and gets real hot she begins to sweat a lot and this is not her normal. Her PCP is Dr. Hilma Favors.  She has tried to take calcium supplements before but it causes every joint in her body to feel sore. She refuses to take a calcium supplement due to these side effects. She has been taking over the counter Vitamin D since being told her levels were low after her labs on 09/06/15. She has not had any issues with the Vitamin D supplement. I spoke with her about prescription Vitamin D supplement and she was concerned what the side effects would be.    ROS  Positive for fatigue. Positive for tingling. Burning sensation in hands and feet, managed with gabapentin. 14 point review of systems was performed and is negative except as detailed under history of present illness and above   Past Medical History:  Diagnosis Date  . Adenocarcinoma of sigmoid colon ([facility]) 11/14/2010  . Arthritis   . Breast CA ([place]) 11/14/2010  . Colon cancer ([facility]) 11/14/2010  . Depression   . Falls   . H/O: hysterectomy   . History of right knee surgery    for bone cancer right knee  . Hypercholesteremia   . Hypertension   . Invasive ductal carcinoma of right breast ([place] [facility]) 11/14/2010  . Mitral valve prolapse   . Osteoporosis   . Osteoporosis, senile 03/05/2011  . Peripheral neuropathy ([facility])   . 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 S/P cataract surgery    bil eyes    Past Surgical History:  Procedure Laterality Date  . ABDOMINAL HYSTERECTOMY    . CATARACT EXTRACTION, BILATERAL    . COLONOSCOPY  08/27/2008   Dr. Madolyn Frieze papilla.  Surgical anastomosis at 8-10 cm from the anal verge.  Residual rectal and colonic mucosa appeared normal.  . COLONOSCOPY N/A 08/31/2013   Procedure: COLONOSCOPY;  Surgeon: Daneil Dolin, MD;  Location: [facility] ENDO SUITE;  Service: Endoscopy;  Laterality: N/A;  2:45  . Epidural steroid injection with Kenalog     Midline  . KNEE SURGERY    . PORT-A-CATH  REMOVAL  02/04/2012   Procedure: REMOVAL PORT-A-CATH;  Surgeon: Donato Heinz, MD;  Location: [facility] ORS;  Service: General;  Laterality: N/A;  minor room  . retinal hole repair     bilateral  . TONSILLECTOMY    . YAG LASER APPLICATION Left 8/36/6294   Procedure: YAG LASER APPLICATION;  Surgeon: Elta Guadeloupe T. Gershon Crane, MD;  Location: [facility] ORS;  Service: Ophthalmology;  Laterality: Left;    Family History  Problem Relation Age of Onset  .  Stroke Mother   . Cancer Mother     breast  cancer  . Heart attack Father   . Colon cancer Neg Hx     Social History   Social History  . Marital status: Married    Spouse name: N/A  . Number of children: N/A  . Years of education: N/A   Social History Main Topics  . Smoking status: Never Smoker  . Smokeless tobacco: Never Used  . Alcohol use No  . Drug use: No  . Sexual activity: No   Other Topics Concern  . None   Social History Narrative  . None     PHYSICAL EXAMINATION  ECOG PERFORMANCE STATUS: 0 - Asymptomatic  Vitals:   11/01/15 1000  BP: (!) 165/51  Pulse: (!) 50  Resp: 16  Temp: 97.4 F (36.3 C)    GENERAL:alert, healthy, no distress, well nourished, well developed, comfortable, cooperative, smiling and unaccompanied SKIN: skin color, texture, turgor are normal, no rashes or significant lesions HEAD: Normocephalic, No masses, lesions, tenderness or abnormalities EYES: normal, EOMI, Conjunctiva are pink and non-injected EARS: External ears normal OROPHARYNX:lips, buccal mucosa, and tongue normal and mucous membranes are moist  NECK: supple, trachea midline LYMPH:  not examined BREAST:not examined LUNGS: clear to auscultation  HEART: irregularly irregular rate & rhythm ABDOMEN:abdomen soft and normal bowel sounds BACK: Back symmetric, no curvature. EXTREMITIES:less then 2 second capillary refill, no joint deformities, effusion, or inflammation, no edema, no skin discoloration, no cyanosis  NEURO: alert & oriented x 3 with  fluent speech, no focal motor/sensory deficits, gait normal   LABORATORY DATA: I have reviewed the data as listed. CBC    Component Value Date/Time   WBC 6.6 09/06/2015 1004   RBC 3.65 (L) 09/06/2015 1004   HGB 11.1 (L) 09/06/2015 1004   HCT 33.5 (L) 09/06/2015 1004   PLT 181 09/06/2015 1004   MCV 91.8 09/06/2015 1004   MCH 30.4 09/06/2015 1004   MCHC 33.1 09/06/2015 1004   RDW 15.0 09/06/2015 1004   LYMPHSABS 1.1 09/06/2015 1004   MONOABS 0.5 09/06/2015 1004   EOSABS 0.0 09/06/2015 1004   BASOSABS 0.0 09/06/2015 1004      Chemistry      Component Value Date/Time   NA 138 09/06/2015 1004   K 3.9 09/06/2015 1004   CL 104 09/06/2015 1004   CO2 28 09/06/2015 1004   BUN 19 09/06/2015 1004   CREATININE 1.02 (H) 09/06/2015 1004      Component Value Date/Time   CALCIUM 9.3 09/06/2015 1004   ALKPHOS 32 (L) 09/06/2015 1004   AST 19 09/06/2015 1004   ALT 16 09/06/2015 1004   BILITOT 0.6 09/06/2015 1004     Results for [place], Patricia Kline (MRN [id number])   Ref. Range 03/11/2015 10:30 09/06/2015 10:04  Vitamin D, 25-Hydroxy Latest Ref Range: 30.0 - 100.0 ng/mL 14.3 (L) 16.0 (L)      RADIOGRAPHIC STUDIES: I have personally reviewed the radiological images as listed and agreed with the findings in the report. EXAM: DUAL X-RAY ABSORPTIOMETRY (DXA) FOR BONE MINERAL DENSITY  IMPRESSION: Ordering Physician: Baird Cancer PA-C,  Your patient [place] completed a BMD test on 11/15/2014 using the [place] (software version: 14.10) manufactured by [employer]. The following summarizes the results of our evaluation. PATIENT BIOGRAPHICAL: Name: ALIZZON, DIOGUARDI Patient ID: [id number] Birth Date: 01-11-1941 Height: 64.0 in. Gender: Female Exam Date: 11/15/2014 Weight: 114.0 lbs. Indications: Low Body Weight, Low Calcium Intake,  Partial Hysterectomy, Post Menopausal, Caucasian, Hx Breast Ca, Follow up Osteoporosis, Height Loss Fractures: Treatments:  Prolia DENSITOMETRY RESULTS: Site      Region      Measured Date Measured Age WHO Classification Young Adult T-score BMD         %Change vs. Previous Significant Change (*) [facility] Spine L2-L4 11/15/2014 72.7 Osteopenia -2.0 0.962 g/cm2 2.9% - AP Spine L2-L4 11/17/2012 70.7 Osteopenia -2.2 0.935 g/cm2 7.1% Yes AP Spine L2-L4 11/16/2009 67.7 Osteoporosis -2.7 0.873 g/cm2 -3.0% - AP Spine L2-L4 12/09/2003 61.7 Osteoporosis -2.5 0.900 g/cm2 3.2% Yes AP Spine L2-L4 10/28/2002 60.6 Osteoporosis -2.7 0.872 g/cm2 - -  DualFemur Total Right 11/15/2014 72.7 Osteoporosis -2.6 0.686 g/cm2 5.4% Yes DualFemur Total Right 11/17/2012 70.7 Osteoporosis -2.8 0.651 g/cm2 0.0% - DualFemur Total Right 11/16/2009 67.7 Osteoporosis -2.8 0.651 g/cm2 -0.6% - DualFemur Total Right 12/09/2003 61.7 Osteoporosis -2.8 0.655 g/cm2 -3.8% Yes DualFemur Total Right 10/28/2002 60.6 Osteoporosis -2.6 0.681 g/cm2 - - ASSESSMENT: BMD as determined from Femur Total Right is 0.686 g/cm2 with a T-Score of -2.6. This patient is considered osteoporotic according to [place] [facility]) criteria. Compared with the prior study on 11/17/2012, the BMD of the lumbar spine shows no statistically significant change. Compared with the prior study on 11/17/2012, the BMD of the right total hip shows a statistically significant increase. (L-1 was excluded due to advanced degenerative changes.) (Patient does not meet criteria for FRAX assessment.)  World Health Organization [place]) criteria for post-menopausal, Caucasian Women: Normal:       T-score at or above -1 SD Osteopenia:   T-score between -1 and -2.5 SD Osteoporosis: T-score at or below -2.5 SD   ASSESSMENT AND PLAN:  Stage III CRC of the sigmoid colon Stage I infiltrating ductal carcinoma of R breast ER+ PR+ HER 2 - Osteoporosis Neuropathy Vitamin D deficiency  We discussed drisdol for her vitamin D deficiency, She is willing to consider this. I have  prescribed drisdol to [employer]. She is not willing to take calcium supplements but was encouraged to make sure she gets plenty of calcium rich foods. There is no good data regarding duration of prolia therapy and she has been on it many years. I have seen data out to 8 years. Will continue for now.   Last mammogram performed on 11/08/14. Last bone density performed on 11/15/14. She is scheduled for a mammogram screening on 11/09/15.  5 years worth of surveillance are complete from a colon cancer standpoint. Therefore, NCCN guidelines does not recommend CEA monitoring or surveillance imaging.   From a breast cancer standpoint, we will continue with breast exams and perform annual screening mammograms.  I will refer the patient again for a nerve conduction study. I will also look into why this was not already performed. She was referred in the past.   The patient requested for the report from today to be sent to her PCP, Dr. Hilma Favors.   LABS from today are pending she will be apprised of results.   She will return for follow up in 6 weeks to assess tolerance to drisdol (as she has concerns about this) and to follow up with her NCS.  All questions were answered. The patient knows to call the clinic with any problems, questions or concerns. We can certainly see the patient much sooner if necessary.  This document serves as a record of services personally performed by Ancil Linsey, MD. It was created on her behalf by Arlyce Harman, a trained medical scribe.  The creation of this record is based on the scribe's personal observations and the provider's statements to them. This document has been checked and approved by the attending provider.  I have reviewed the above documentation for accuracy and completeness, and I agree with the above.  This note is electronically signed PN:[id number] Cyril Mourning, MD  11/01/2015 11:07 AM

## 2015-11-02 DIAGNOSIS — E785 Hyperlipidemia, unspecified: Secondary | ICD-10-CM | POA: Diagnosis not present

## 2015-11-02 DIAGNOSIS — G603 Idiopathic progressive neuropathy: Secondary | ICD-10-CM | POA: Diagnosis not present

## 2015-11-02 DIAGNOSIS — I10 Essential (primary) hypertension: Secondary | ICD-10-CM | POA: Diagnosis not present

## 2015-11-02 DIAGNOSIS — Z853 Personal history of malignant neoplasm of breast: Secondary | ICD-10-CM | POA: Diagnosis not present

## 2015-11-02 DIAGNOSIS — Z85038 Personal history of other malignant neoplasm of large intestine: Secondary | ICD-10-CM | POA: Diagnosis not present

## 2015-11-03 ENCOUNTER — Telehealth (HOSPITAL_COMMUNITY): Payer: Self-pay | Admitting: *Deleted

## 2015-11-04 ENCOUNTER — Encounter (HOSPITAL_COMMUNITY): Payer: Self-pay | Admitting: Emergency Medicine

## 2015-11-04 ENCOUNTER — Other Ambulatory Visit (HOSPITAL_COMMUNITY): Payer: Self-pay | Admitting: Oncology

## 2015-11-04 DIAGNOSIS — E611 Iron deficiency: Secondary | ICD-10-CM

## 2015-11-04 DIAGNOSIS — E538 Deficiency of other specified B group vitamins: Secondary | ICD-10-CM

## 2015-11-04 MED ORDER — VITAMIN B-12 1000 MCG PO TABS: 1000.0000 ug | ORAL_TABLET | Freq: Every day | ORAL | 11 refills | Status: DC

## 2015-11-04 MED ORDER — POLYSACCHARIDE IRON COMPLEX 150 MG PO CAPS: 150.0000 mg | ORAL_CAPSULE | Freq: Every day | ORAL | 11 refills | Status: DC

## 2015-11-04 NOTE — Progress Notes (Signed)
Pt called and stated that she cant take iron pills and that she cant take the B12 pills,  She had a B12 shot in June that didn't help her at all.  She has to do one thing at a time.  She just started Vitamin D so she has to see if she can take that first.  She will talk with the doctor when she comes in in 6 weeks.

## 2015-11-09 ENCOUNTER — Ambulatory Visit (HOSPITAL_COMMUNITY)
Admission: RE | Admit: 2015-11-09 | Discharge: 2015-11-09 | Disposition: A | Discharge status: Home / Self Care | Payer: Medicare Other | Source: Ambulatory Visit | Attending: Oncology | Admitting: Oncology

## 2015-11-09 ENCOUNTER — Encounter (HOSPITAL_COMMUNITY): Payer: Medicare Other

## 2015-11-09 DIAGNOSIS — Z1231 Encounter for screening mammogram for malignant neoplasm of breast: Secondary | ICD-10-CM | POA: Insufficient documentation

## 2015-11-09 DIAGNOSIS — E7800 Pure hypercholesterolemia, unspecified: Secondary | ICD-10-CM | POA: Diagnosis not present

## 2015-11-09 DIAGNOSIS — E538 Deficiency of other specified B group vitamins: Secondary | ICD-10-CM

## 2015-11-09 DIAGNOSIS — Z90710 Acquired absence of both cervix and uterus: Secondary | ICD-10-CM | POA: Diagnosis not present

## 2015-11-09 DIAGNOSIS — Z85038 Personal history of other malignant neoplasm of large intestine: Secondary | ICD-10-CM | POA: Diagnosis not present

## 2015-11-09 DIAGNOSIS — I10 Essential (primary) hypertension: Secondary | ICD-10-CM | POA: Diagnosis not present

## 2015-11-09 DIAGNOSIS — Z853 Personal history of malignant neoplasm of breast: Secondary | ICD-10-CM | POA: Diagnosis not present

## 2015-11-09 DIAGNOSIS — Z9889 Other specified postprocedural states: Secondary | ICD-10-CM | POA: Diagnosis not present

## 2015-11-10 LAB — ANTI-PARIETAL ANTIBODY: PARIETAL CELL ANTIBODY-IGG: 2.9 U (ref 0.0–20.0)

## 2015-11-10 LAB — HOMOCYSTEINE: Homocysteine: 17.7 umol/L — ABNORMAL HIGH (ref 0.0–15.0)

## 2015-11-10 LAB — INTRINSIC FACTOR ANTIBODIES: Intrinsic Factor: 1 AU/mL (ref 0.0–1.1)

## 2015-11-12 LAB — METHYLMALONIC ACID, SERUM: METHYLMALONIC ACID, QUANTITATIVE: 223 nmol/L (ref 0–378)

## 2015-11-15 NOTE — Progress Notes (Signed)
Called patient with Lab results. She wanted to know if she was to continue taking Vitamin D or just take it for a month. Instructed patient to continue taking Vitamin D until the MD told her to stop. Also she wants MD to know she is able to take B12 but not Iron. She states the Iron makes her nauseated.

## 2015-11-27 ENCOUNTER — Encounter (HOSPITAL_COMMUNITY): Payer: Self-pay | Admitting: Hematology & Oncology

## 2015-11-27 DIAGNOSIS — E559 Vitamin D deficiency, unspecified: Secondary | ICD-10-CM | POA: Insufficient documentation

## 2015-12-15 ENCOUNTER — Encounter (HOSPITAL_COMMUNITY): Payer: Self-pay | Admitting: Oncology

## 2015-12-15 ENCOUNTER — Encounter (HOSPITAL_COMMUNITY): Payer: Medicare Other | Attending: Hematology & Oncology | Admitting: Oncology

## 2015-12-15 VITALS — BP 112/44 | HR 60 | Temp 97.7°F | Resp 16 | Wt 113.4 lb

## 2015-12-15 DIAGNOSIS — Z808 Family history of malignant neoplasm of other organs or systems: Secondary | ICD-10-CM | POA: Insufficient documentation

## 2015-12-15 DIAGNOSIS — E559 Vitamin D deficiency, unspecified: Secondary | ICD-10-CM

## 2015-12-15 DIAGNOSIS — Z90710 Acquired absence of both cervix and uterus: Secondary | ICD-10-CM | POA: Insufficient documentation

## 2015-12-15 DIAGNOSIS — Z9889 Other specified postprocedural states: Secondary | ICD-10-CM | POA: Insufficient documentation

## 2015-12-15 DIAGNOSIS — C50911 Malignant neoplasm of unspecified site of right female breast: Secondary | ICD-10-CM | POA: Diagnosis not present

## 2015-12-15 DIAGNOSIS — Z823 Family history of stroke: Secondary | ICD-10-CM | POA: Insufficient documentation

## 2015-12-15 DIAGNOSIS — Z803 Family history of malignant neoplasm of breast: Secondary | ICD-10-CM | POA: Insufficient documentation

## 2015-12-15 DIAGNOSIS — M810 Age-related osteoporosis without current pathological fracture: Secondary | ICD-10-CM

## 2015-12-15 DIAGNOSIS — E7800 Pure hypercholesterolemia, unspecified: Secondary | ICD-10-CM | POA: Insufficient documentation

## 2015-12-15 DIAGNOSIS — I10 Essential (primary) hypertension: Secondary | ICD-10-CM | POA: Insufficient documentation

## 2015-12-15 DIAGNOSIS — Z85038 Personal history of other malignant neoplasm of large intestine: Secondary | ICD-10-CM | POA: Diagnosis not present

## 2015-12-15 DIAGNOSIS — C187 Malignant neoplasm of sigmoid colon: Secondary | ICD-10-CM

## 2015-12-15 DIAGNOSIS — Z853 Personal history of malignant neoplasm of breast: Secondary | ICD-10-CM | POA: Insufficient documentation

## 2015-12-15 DIAGNOSIS — Z9221 Personal history of antineoplastic chemotherapy: Secondary | ICD-10-CM | POA: Insufficient documentation

## 2015-12-15 NOTE — Patient Instructions (Signed)
[place] at [facility] Discharge Instructions  RECOMMENDATIONS MADE BY THE CONSULTANT AND ANY TEST RESULTS WILL BE SENT TO YOUR REFERRING PHYSICIAN.  Labs in December Prolia injection in December Return appointment in December Call us in the interim for any issues related to your breast/colon cancer. Influenza vaccine declined today.  Have this completed by your primary care provider as scheduled.  Thank you for choosing [place] at [facility] to provide your oncology and hematology care.  To afford each patient quality time with our provider, please arrive at least 15 minutes before your scheduled appointment time.   Beginning January 23rd 2017 lab work for the [facility] will be done in the  Main lab at [facility] on 1st floor. If you have a lab appointment with the [facility] please come in thru the  Main Entrance and check in at the main information desk  You need to re-schedule your appointment should you arrive 10 or more minutes late.  We strive to give you quality time with our providers, and arriving late affects you and other patients whose appointments are after yours.  Also, if you no show three or more times for appointments you may be dismissed from the clinic at the providers discretion.     Again, thank you for choosing [facility].  Our hope is that these requests will decrease the amount of time that you wait before being seen by our physicians.       _____________________________________________________________  Should you have questions after your visit to [facility], please contact our office at [phone] between the hours of 8:30 a.m. and 4:30 p.m.  Voicemails left after 4:30 p.m. will not be returned until the following business day.  For prescription refill requests, have your pharmacy contact our office.         Resources For Cancer Patients and their Caregivers ? American  Cancer Society: Can assist with transportation, wigs, general needs, runs Look Good Feel Better.        8630358365 ? Cancer Care: Provides financial assistance, online support groups, medication/co-pay assistance.  1-800-813-HOPE (415) 569-7164) ? [facility] Assists [place] cancer patients and their families through emotional , educational and financial support.  760-605-5293 ? [place] DSS Where to apply for food stamps, Medicaid and utility assistance. 337-830-0820 ? [facility]: Transportation to medical appointments. 514-258-4175 ? Social Security Administration: May apply for disability if have a Stage IV cancer. 6706757290 639 801 2392 ? [employer], Disability and Transit Services: Assists with nutrition, care and transit needs. [place] Support Programs: @10RELATIVEDAYS @ > Cancer Support Group  2nd Tuesday of the month 1pm-2pm, Journey Room  > Creative Journey  3rd Tuesday of the month 1130am-1pm, Journey Room  > Look Good Feel Better  1st Wednesday of the month 10am-12 noon, Journey Room (Call [place] to register [phone])

## 2015-12-15 NOTE — Assessment & Plan Note (Signed)
Osteoporosis, on Prolia every 6 months without Ca++ and Vit D supplements due to intolerance.  She notes intolerance to Drisdol resulting in constipation. She is utilizing stool softeners. She will discontinued visceral therapy early as result.   Next Prolia injection is due in December 2017  Bone density is due in September 2018.  Order is placed.

## 2015-12-15 NOTE — Assessment & Plan Note (Signed)
Stage III adenocarcinoma sigmoid colon status post resection on 05/29/2004 for this 6.0 cm cancer with 6 of 15 positive lymph nodes. We gave her oxaliplatin and capecitabine adjuvantly for 6 cycles, thus far without recurrence. Negative colonoscopy in June 2015 and is due for another one in June 2020 according to Dr. Gala Romney.   Labs in 6 months: CBC diff, CMET, CEA.

## 2015-12-15 NOTE — Progress Notes (Signed)
Patricia Kilts, MD [place] [address]  Invasive ductal carcinoma of right breast ([place]) - Plan: CBC with Differential, Comprehensive metabolic panel  Osteoporosis, senile  Vitamin D deficiency - Plan: Vitamin D 25 hydroxy  Adenocarcinoma of sigmoid colon ([place]) - Plan: CEA  CURRENT THERAPY: Surveillance per NCCN guidelines and Prolia 60 mg every 6 months (last given on 03/13/2013)  INTERVAL HISTORY: Patricia Kline 75 y.o. female returns for followup of Stage III adenocarcinoma sigmoid colon status post resection on 05/29/2004 for is 6.0 cm cancer with 6 of 15 positive lymph nodes. We gave her oxaliplatin and capecitabine adjuvantly for 6 cycles, thus far without recurrence. Negative colonoscopy in May 2010 and is due for another one in June 2020 according to Dr. Gala Romney.  AND  Stage I (T1 C. N0 M0) invasive ductal carcinoma of the right breast, ER 89% positive, PR 10% positive, HER-2/neu nonamplified, Ki-67 marker 16% status post surgical resection on 12/20/2004 after possibly 4 months of adjuvant Arimidex. The cancer was 2.0 cm pathologically, status post lumpectomy, sentinel node biopsy followed by radiation therapy followed by adjuvant Arimidex for 5 years. She cannot tolerate Arimidex so we switched her to tamoxifen. We had tried letrozole also but that was too costly for her. She therefore had stage II disease clinically at presentation and by MRI, but was reduced to stage I at the time of surgery by the adjuvant hormonal therapy. AND Osteoporosis, on Prolia 60 mg beginning on 03/05/2011.    Invasive ductal carcinoma of right breast ([place])   07/19/2004 Imaging    PET- increased uptake in posterior aspect of right breast      08/09/2004 Imaging    Mammogram and [place]      08/09/2004 Initial Diagnosis    Invasive ductal carcinoma of right breast on needle core biopsy of right breast      08/22/2004 Imaging    MRI B/L Breast- 1.8 x 1.4 x 1.3 cm right  breast mass      08/29/2004 - 12/13/2004 Chemotherapy    Arimidex x 4 months neoadjuvantly- 77% shrinkage by volume size      12/20/2004 Surgery    Right partial mastectomy- 2 cm, ER 89%, PR 10%, Her2 negative, Ki-67 16%.        02/06/2005 - 03/28/2005 Radiation Therapy    By Dr. Elba Barman      05/15/2005 - 05/06/2007 Chemotherapy    Femara      08/29/2005 Imaging    Mammogram s/p chemo and resection.  BIRADS 2      05/06/2007 - 09/06/2009 Chemotherapy    Tamoxifen       Adenocarcinoma of sigmoid colon ([place])   05/29/2004 Initial Diagnosis    Adenocarcinoma of sigmoid colon      05/29/2004 Surgery    Segmental resection, 6 cm maximum size, adenocarcinoma, 6/15 lymph nodes positive for disease, T3N2      07/25/2004 - 11/15/2004 Chemotherapy    Xeloda + Oxaliplatin, Avastin x 6 cycles.  Avastin d/c after cycle 1       Remission          IShe reports intolerance to Drisdol causing constipation. I suspect this is not from this medication. She stopped taking the medication. She is utilizing stool softeners for constipation. She has continued with calcium rich foods.  She was seen by neurology and scheduled for nerve conduction study. She notes that this study was canceled due to her insurance not  covering this test.  She notes that her husband's health has declined since May 2017. As a result, her responsibilities at home have increased significantly therefore hindering her ability to help care for her 66 year old mother. Fortunately, she's been able to get some help caring for her mother.  Review of Systems  Constitutional: Negative.  Negative for chills, fever and weight loss.  HENT: Negative.   Eyes: Negative.   Respiratory: Negative.   Cardiovascular: Negative.   Gastrointestinal: Negative.   Genitourinary: Negative.   Musculoskeletal: Negative.   Skin: Negative.   Neurological: Positive for tingling. Negative for weakness.  Endo/Heme/Allergies: Negative.     Psychiatric/Behavioral: Negative.     Past Medical History:  Diagnosis Date  . Adenocarcinoma of sigmoid colon ([place]) 11/14/2010  . Arthritis   . Breast CA ([place]) 11/14/2010  . Colon cancer ([place]) 11/14/2010  . Depression   . Falls   . H/O: hysterectomy   . History of right knee surgery    for bone cancer right knee  . Hypercholesteremia   . Hypertension   . Invasive ductal carcinoma of right breast ([place]) 11/14/2010  . Mitral valve prolapse   . Osteoporosis   . Osteoporosis, senile 03/05/2011  . Peripheral neuropathy ([place])   . 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 S/P cataract surgery    bil eyes    Past Surgical History:  Procedure Laterality Date  . ABDOMINAL HYSTERECTOMY    . CATARACT EXTRACTION, BILATERAL    . COLONOSCOPY  08/27/2008   Dr. Madolyn Frieze papilla.  Surgical anastomosis at 8-10 cm from the anal verge.  Residual rectal and colonic mucosa appeared normal.  . COLONOSCOPY N/A 08/31/2013   Procedure: COLONOSCOPY;  Surgeon: Daneil Dolin, MD;  Location: [facility];  Service: Endoscopy;  Laterality: N/A;  2:45  . Epidural steroid injection with Kenalog     Midline  . KNEE SURGERY    . PORT-A-CATH REMOVAL  02/04/2012   Procedure: REMOVAL PORT-A-CATH;  Surgeon: Donato Heinz, MD;  Location: [facility] ORS;  Service: General;  Laterality: N/A;  minor room  . retinal hole repair     bilateral  . TONSILLECTOMY    . YAG LASER APPLICATION Left 3/47/4259   Procedure: YAG LASER APPLICATION;  Surgeon: Elta Guadeloupe T. Gershon Crane, MD;  Location: [facility] ORS;  Service: Ophthalmology;  Laterality: Left;    Family History  Problem Relation Age of Onset  . Stroke Mother   . Cancer Mother     breast  cancer  . Heart attack Father   . Colon cancer Neg Hx     Social History   Social History  . Marital status: Married    Spouse name: N/A  . Number of children: N/A  . Years of education: N/A   Social History Main Topics  . Smoking status: Never Smoker   . Smokeless tobacco: Never Used  . Alcohol use No  . Drug use: No  . Sexual activity: No   Other Topics Concern  . None   Social History Narrative  . None     PHYSICAL EXAMINATION  ECOG PERFORMANCE STATUS: 0 - Asymptomatic  Vitals:   12/15/15 1400  BP: (!) 112/44  Pulse: 60  Resp: 16  Temp: 97.7 F (36.5 C)    GENERAL:alert, healthy, no distress, well nourished, well developed, comfortable, cooperative, smiling and unaccompanied SKIN: skin color, texture, turgor are normal, no rashes or significant lesions HEAD: Normocephalic, No masses, lesions, tenderness or abnormalities  EYES: normal, EOMI, Conjunctiva are pink and non-injected EARS: External ears normal OROPHARYNX:lips, buccal mucosa, and tongue normal and mucous membranes are moist  NECK: supple, trachea midline LYMPH:  not examined BREAST:not examined LUNGS: clear to auscultation  HEART: regular rate & rhythm ABDOMEN:abdomen soft and normal bowel sounds BACK: Back symmetric, no curvature. EXTREMITIES:less then 2 second capillary refill, no joint deformities, effusion, or inflammation, no edema, no skin discoloration, no cyanosis  NEURO: alert & oriented x 3 with fluent speech, no focal motor/sensory deficits, gait normal   LABORATORY DATA: CBC    Component Value Date/Time   WBC 6.0 11/01/2015 1000   RBC 4.05 11/01/2015 1000   RBC 4.05 11/01/2015 1000   HGB 12.8 11/01/2015 1000   HCT 38.2 11/01/2015 1000   PLT 189 11/01/2015 1000   MCV 94.3 11/01/2015 1000   MCH 31.6 11/01/2015 1000   MCHC 33.5 11/01/2015 1000   RDW 13.9 11/01/2015 1000   LYMPHSABS 1.1 09/06/2015 1004   MONOABS 0.5 09/06/2015 1004   EOSABS 0.0 09/06/2015 1004   BASOSABS 0.0 09/06/2015 1004      Chemistry      Component Value Date/Time   NA 138 09/06/2015 1004   K 3.9 09/06/2015 1004   CL 104 09/06/2015 1004   CO2 28 09/06/2015 1004   BUN 19 09/06/2015 1004   CREATININE 1.02 (H) 09/06/2015 1004      Component Value  Date/Time   CALCIUM 9.3 09/06/2015 1004   ALKPHOS 32 (L) 09/06/2015 1004   AST 19 09/06/2015 1004   ALT 16 09/06/2015 1004   BILITOT 0.6 09/06/2015 1004     Lab Results  Component Value Date   LABCA2 16.2 09/06/2015     PENDING LABS:   RADIOGRAPHIC STUDIES:  No results found.   PATHOLOGY:    ASSESSMENT AND PLAN:  Invasive ductal carcinoma of right breast Stage I (T1 C. 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 The cancer was 2.0 cm pathologically, status post lumpectomy, sentinel node biopsy followed by radiation therapy followed by adjuvant Arimidex for 5 years. She could not tolerate Arimidex so we switched her to tamoxifen. We had tried letrozole also but that was too costly for her. She therefore had stage II disease clinically at presentation and by MRI, but was reduced to stage I at the time of surgery by the adjuvant hormonal therapy.   No labs today.  Previous labs reviewed: MMA, homocysteine level, anti-parietal cell antibody, and intrinsic factor antibody. Testing is negative for pernicious anemia.  She is therefore on B12 replacement with 1000 mcg PO daily.  I personally reviewed and went over radiographic studies with the patient.  The results are noted within this dictation.  Mammogram in August 2017 was NEGATIVE.  Labs in December: CBC diff, CMET.   Return in December 2017.  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 We gave her oxaliplatin and capecitabine adjuvantly for 6 cycles, thus far without recurrence. Negative colonoscopy in June 2015 and is due for another one in June 2020 according to Dr. Gala Romney.   Labs in 6 months: CBC diff, CMET, CEA.  Osteoporosis,  senile Osteoporosis, on Prolia every 6 months without Ca++ and Vit D supplements due to intolerance.  She notes intolerance  to Drisdol resulting in constipation. She is utilizing stool softeners. She will discontinued visceral therapy early as result.   Next Prolia injection is due in December 2017  Bone density is due in September 2018.  Order is placed.   ORDERS PLACED FOR THIS ENCOUNTER: Orders Placed This Encounter  Procedures  . CBC with Differential  . Comprehensive metabolic panel  . Vitamin D 25 hydroxy  . CEA    MEDICATIONS PRESCRIBED THIS ENCOUNTER: No orders of the defined types were placed in this encounter.   THERAPY PLAN:  5 years worth of surveillance are complete from a colon cancer standpoint. Therefore, NCCN guidelines does not recommend CEA monitoring or surveillance imaging.  From a breast cancer standpoint, we will continue with breast exams and perform annual screening mammograms. From an osteoporosis standpoint, we will continue with Prolia every 6 months as her most recent bone scan is improved. She is encouraged to continue Ca++ and Vit D supplementation via food products rich in these vitamins (as she reports intolerance to PO Ca++ and Vit D).  All questions were answered. The patient knows to call the clinic with any problems, questions or concerns. We can certainly see the patient much sooner if necessary.  Patient and plan discussed with Dr. Ancil Linsey and she is in agreement with the aforementioned.   This note is electronically signed by: Doy Mince 12/15/2015 11:28 PM

## 2015-12-15 NOTE — Assessment & Plan Note (Signed)
Stage I (T1 C. 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 The cancer was 2.0 cm pathologically, status post lumpectomy, sentinel node biopsy followed by radiation therapy followed by adjuvant Arimidex for 5 years. She could not tolerate Arimidex so we switched her to tamoxifen. We had tried letrozole also but that was too costly for her. She therefore had stage II disease clinically at presentation and by MRI, but was reduced to stage I at the time of surgery by the adjuvant hormonal therapy.   No labs today.  Previous labs reviewed: MMA, homocysteine level, anti-parietal cell antibody, and intrinsic factor antibody. Testing is negative for pernicious anemia.  She is therefore on B12 replacement with 1000 mcg PO daily.  I personally reviewed and went over radiographic studies with the patient.  The results are noted within this dictation.  Mammogram in August 2017 was NEGATIVE.  Labs in December: CBC diff, CMET.   Return in December 2017.

## 2016-01-24 DIAGNOSIS — Z23 Encounter for immunization: Secondary | ICD-10-CM | POA: Diagnosis not present

## 2016-01-24 DIAGNOSIS — E782 Mixed hyperlipidemia: Secondary | ICD-10-CM | POA: Diagnosis not present

## 2016-01-24 DIAGNOSIS — M810 Age-related osteoporosis without current pathological fracture: Secondary | ICD-10-CM | POA: Diagnosis not present

## 2016-01-24 DIAGNOSIS — M1991 Primary osteoarthritis, unspecified site: Secondary | ICD-10-CM | POA: Diagnosis not present

## 2016-01-24 DIAGNOSIS — Z1389 Encounter for screening for other disorder: Secondary | ICD-10-CM | POA: Diagnosis not present

## 2016-01-24 DIAGNOSIS — Z0000 Encounter for general adult medical examination without abnormal findings: Secondary | ICD-10-CM | POA: Diagnosis not present

## 2016-01-24 DIAGNOSIS — I10 Essential (primary) hypertension: Secondary | ICD-10-CM | POA: Diagnosis not present

## 2016-01-24 DIAGNOSIS — Z681 Body mass index (BMI) 19 or less, adult: Secondary | ICD-10-CM | POA: Diagnosis not present

## 2016-01-24 DIAGNOSIS — E538 Deficiency of other specified B group vitamins: Secondary | ICD-10-CM | POA: Diagnosis not present

## 2016-01-24 DIAGNOSIS — R5383 Other fatigue: Secondary | ICD-10-CM | POA: Diagnosis not present

## 2016-01-24 DIAGNOSIS — E785 Hyperlipidemia, unspecified: Secondary | ICD-10-CM | POA: Diagnosis not present

## 2016-01-24 DIAGNOSIS — E079 Disorder of thyroid, unspecified: Secondary | ICD-10-CM | POA: Diagnosis not present

## 2016-01-31 DIAGNOSIS — H353131 Nonexudative age-related macular degeneration, bilateral, early dry stage: Secondary | ICD-10-CM | POA: Diagnosis not present

## 2016-01-31 DIAGNOSIS — H5213 Myopia, bilateral: Secondary | ICD-10-CM | POA: Diagnosis not present

## 2016-01-31 DIAGNOSIS — H52203 Unspecified astigmatism, bilateral: Secondary | ICD-10-CM | POA: Diagnosis not present

## 2016-01-31 DIAGNOSIS — Z961 Presence of intraocular lens: Secondary | ICD-10-CM | POA: Diagnosis not present

## 2016-01-31 DIAGNOSIS — H524 Presbyopia: Secondary | ICD-10-CM | POA: Diagnosis not present

## 2016-01-31 DIAGNOSIS — H35341 Macular cyst, hole, or pseudohole, right eye: Secondary | ICD-10-CM | POA: Diagnosis not present

## 2016-03-07 ENCOUNTER — Encounter (HOSPITAL_BASED_OUTPATIENT_CLINIC_OR_DEPARTMENT_OTHER): Payer: Medicare Other | Admitting: Hematology & Oncology

## 2016-03-07 ENCOUNTER — Encounter (HOSPITAL_COMMUNITY): Payer: Self-pay | Admitting: Hematology & Oncology

## 2016-03-07 ENCOUNTER — Encounter (HOSPITAL_COMMUNITY): Payer: Medicare Other

## 2016-03-07 ENCOUNTER — Encounter (HOSPITAL_COMMUNITY): Payer: Medicare Other | Attending: Hematology & Oncology

## 2016-03-07 ENCOUNTER — Other Ambulatory Visit (HOSPITAL_COMMUNITY): Payer: Medicare Other

## 2016-03-07 VITALS — BP 161/51 | HR 56 | Temp 97.9°F | Resp 16 | Wt 113.3 lb

## 2016-03-07 DIAGNOSIS — M810 Age-related osteoporosis without current pathological fracture: Secondary | ICD-10-CM | POA: Insufficient documentation

## 2016-03-07 DIAGNOSIS — C50911 Malignant neoplasm of unspecified site of right female breast: Secondary | ICD-10-CM

## 2016-03-07 DIAGNOSIS — M818 Other osteoporosis without current pathological fracture: Secondary | ICD-10-CM | POA: Diagnosis present

## 2016-03-07 DIAGNOSIS — E559 Vitamin D deficiency, unspecified: Secondary | ICD-10-CM | POA: Diagnosis not present

## 2016-03-07 DIAGNOSIS — C187 Malignant neoplasm of sigmoid colon: Secondary | ICD-10-CM

## 2016-03-07 LAB — CBC WITH DIFFERENTIAL/PLATELET
BASOS ABS: 0 10*3/uL (ref 0.0–0.1)
BASOS PCT: 1 %
Eosinophils Absolute: 0.1 10*3/uL (ref 0.0–0.7)
Eosinophils Relative: 1 %
HEMATOCRIT: 36.5 % (ref 36.0–46.0)
Hemoglobin: 12.3 g/dL (ref 12.0–15.0)
Lymphocytes Relative: 23 %
Lymphs Abs: 1.4 10*3/uL (ref 0.7–4.0)
MCH: 30.8 pg (ref 26.0–34.0)
MCHC: 33.7 g/dL (ref 30.0–36.0)
MCV: 91.5 fL (ref 78.0–100.0)
MONO ABS: 0.7 10*3/uL (ref 0.1–1.0)
Monocytes Relative: 11 %
NEUTROS ABS: 4 10*3/uL (ref 1.7–7.7)
NEUTROS PCT: 64 %
Platelets: 151 10*3/uL (ref 150–400)
RBC: 3.99 MIL/uL (ref 3.87–5.11)
RDW: 13.8 % (ref 11.5–15.5)
WBC: 6.2 10*3/uL (ref 4.0–10.5)

## 2016-03-07 LAB — COMPREHENSIVE METABOLIC PANEL
ALBUMIN: 4.4 g/dL (ref 3.5–5.0)
ALT: 11 U/L — AB (ref 14–54)
AST: 18 U/L (ref 15–41)
Alkaline Phosphatase: 34 U/L — ABNORMAL LOW (ref 38–126)
Anion gap: 8 (ref 5–15)
BILIRUBIN TOTAL: 0.4 mg/dL (ref 0.3–1.2)
BUN: 27 mg/dL — AB (ref 6–20)
CHLORIDE: 101 mmol/L (ref 101–111)
CO2: 28 mmol/L (ref 22–32)
Calcium: 9 mg/dL (ref 8.9–10.3)
Creatinine, Ser: 1.33 mg/dL — ABNORMAL HIGH (ref 0.44–1.00)
GFR calc Af Amer: 44 mL/min — ABNORMAL LOW (ref >60)
GFR calc non Af Amer: 38 mL/min — ABNORMAL LOW (ref >60)
GLUCOSE: 93 mg/dL (ref 65–99)
POTASSIUM: 3.9 mmol/L (ref 3.5–5.1)
Sodium: 137 mmol/L (ref 135–145)
Total Protein: 7 g/dL (ref 6.5–8.1)

## 2016-03-07 MED ORDER — DENOSUMAB 60 MG/ML ~~LOC~~ SOLN
60.0000 mg | Freq: Once | SUBCUTANEOUS | Status: AC
  Administered 2016-03-07: 60 mg via SUBCUTANEOUS
  Filled 2016-03-07: qty 1

## 2016-03-07 NOTE — Progress Notes (Signed)
Patricia Kline presents today for injection per the provider's orders.  prolia in abdominal tissue administration without incident; see MAR for injection details.  Patient tolerated procedure well and without incident.  No questions or complaints noted at this time.

## 2016-03-07 NOTE — Patient Instructions (Signed)
[place] at [facility]  Discharge Instructions:  prolia today prolia in 6 months Follow up as scheduled Please call the clinic if you have any questions or concerns  _______________________________________________________________  Thank you for choosing [place] at [facility] to provide your oncology and hematology care.  To afford each patient quality time with our providers, please arrive at least 15 minutes before your scheduled appointment.  You need to re-schedule your appointment if you arrive 10 or more minutes late.  We strive to give you quality time with our providers, and arriving late affects you and other patients whose appointments are after yours.  Also, if you no show three or more times for appointments you may be dismissed from the clinic.  Again, thank you for choosing [place] at [facility] hope is that these requests will allow you access to exceptional care and in a timely manner. _______________________________________________________________  If you have questions after your visit, please contact our office at [phone] between the hours of 8:30 a.m. and 5:00 p.m. Voicemails left after 4:30 p.m. will not be returned until the following business day. _______________________________________________________________  For prescription refill requests, have your pharmacy contact our office. _______________________________________________________________  Recommendations made by the consultant and any test results will be sent to your referring physician. _______________________________________________________________

## 2016-03-07 NOTE — Patient Instructions (Addendum)
[place] at [facility] Discharge Instructions  RECOMMENDATIONS MADE BY THE CONSULTANT AND ANY TEST RESULTS WILL BE SENT TO YOUR REFERRING PHYSICIAN.  You saw Dr.Penland today. Follow up in 6 months with labs, MD appt and prolia. See Amy at checkout for appointments.  Thank you for choosing [place] at [facility] to provide your oncology and hematology care.  To afford each patient quality time with our provider, please arrive at least 15 minutes before your scheduled appointment time.   Beginning January 23rd 2017 lab work for the [facility] will be done in the  Main lab at [facility] on 1st floor. If you have a lab appointment with the [facility] please come in thru the  Main Entrance and check in at the main information desk  You need to re-schedule your appointment should you arrive 10 or more minutes late.  We strive to give you quality time with our providers, and arriving late affects you and other patients whose appointments are after yours.  Also, if you no show three or more times for appointments you may be dismissed from the clinic at the providers discretion.     Again, thank you for choosing [facility].  Our hope is that these requests will decrease the amount of time that you wait before being seen by our physicians.       _____________________________________________________________  Should you have questions after your visit to [facility], please contact our office at [phone] between the hours of 8:30 a.m. and 4:30 p.m.  Voicemails left after 4:30 p.m. will not be returned until the following business day.  For prescription refill requests, have your pharmacy contact our office.         Resources For Cancer Patients and their Caregivers ? American Cancer Society: Can assist with transportation, wigs, general needs, runs Look Good Feel Better.        (443)386-4713 ? Cancer  Care: Provides financial assistance, online support groups, medication/co-pay assistance.  1-800-813-HOPE 3061401739) ? [facility] Assists [place] cancer patients and their families through emotional , educational and financial support.  6164367953 ? [place] DSS Where to apply for food stamps, Medicaid and utility assistance. 731-499-5658 ? [facility]: Transportation to medical appointments. 401 484 4676 ? Social Security Administration: May apply for disability if have a Stage IV cancer. 4184880239 364-771-2982 ? [employer], Disability and Transit Services: Assists with nutrition, care and transit needs. [place] Support Programs: @10RELATIVEDAYS @ > Cancer Support Group  2nd Tuesday of the month 1pm-2pm, Journey Room  > Creative Journey  3rd Tuesday of the month 1130am-1pm, Journey Room  > Look Good Feel Better  1st Wednesday of the month 10am-12 noon, Journey Room (Call [facility] to register [phone])

## 2016-03-07 NOTE — Progress Notes (Signed)
PROGRESS NOTE     Patricia Kilts, MD [address] [place] [address]    Invasive ductal carcinoma of right breast ([place])   07/19/2004 Imaging    PET- increased uptake in posterior aspect of right breast      08/09/2004 Imaging    Mammogram and [place]      08/09/2004 Initial Diagnosis    Invasive ductal carcinoma of right breast on needle core biopsy of right breast      08/22/2004 Imaging    MRI B/L Breast- 1.8 x 1.4 x 1.3 cm right breast mass      08/29/2004 - 12/13/2004 Chemotherapy    Arimidex x 4 months neoadjuvantly- 77% shrinkage by volume size      12/20/2004 Surgery    Right partial mastectomy- 2 cm, ER 89%, PR 10%, Her2 negative, Ki-67 16%.        02/06/2005 - 03/28/2005 Radiation Therapy    By Dr. Elba Kline      05/15/2005 - 05/06/2007 Chemotherapy    Femara      08/29/2005 Imaging    Mammogram s/p chemo and resection.  BIRADS 2      05/06/2007 - 09/06/2009 Chemotherapy    Tamoxifen       Adenocarcinoma of sigmoid colon ([place])   05/29/2004 Initial Diagnosis    Adenocarcinoma of sigmoid colon      05/29/2004 Surgery    Segmental resection, 6 cm maximum size, adenocarcinoma, 6/15 lymph nodes positive for disease, T3N2      07/25/2004 - 11/15/2004 Chemotherapy    Xeloda + Oxaliplatin, Avastin x 6 cycles.  Avastin d/c after cycle 1       Remission          CURRENT THERAPY: Surveillance per NCCN guidelines and Prolia 60 mg every 6 months   INTERVAL HISTORY: Patricia Kline 75 y.o. female returns for followup of Stage III adenocarcinoma sigmoid colon status post resection on 05/29/2004 for is 6.0 cm cancer with 6 of 15 positive lymph nodes. We gave her oxaliplatin and capecitabine adjuvantly for 6 cycles, thus far without recurrence. Negative colonoscopy in May 2010 and is due for another one in June 2020 according to Dr. Gala Kline.  AND  Stage I (T1 C. N0 M0) invasive ductal carcinoma of the right breast, ER 89% positive, PR 10% positive, HER-2/neu  nonamplified, Ki-67 marker 16% status post surgical resection on 12/20/2004 after possibly 4 months of adjuvant Arimidex. The cancer was 2.0 cm pathologically, status post lumpectomy, sentinel node biopsy followed by radiation therapy followed by adjuvant Arimidex for 5 years. She cannot tolerate Arimidex so we switched her to tamoxifen. We had tried letrozole also but that was too costly for her. She therefore had stage II disease clinically at presentation and by MRI, but was reduced to stage I at the time of surgery by the adjuvant hormonal therapy. AND Osteoporosis, on Prolia 60 mg beginning on 03/05/2011. I personally reviewed and went over laboratory results with the patient.  The results are noted within this dictation.  I personally reviewed and went over radiographic studies with the patient.  The results are noted within this dictation.  She is due for mammogram in August 2018.  Patricia Kline is unaccompanied. I have reviewed the labs with the patient.   She says that today is one of those days that she just feels down. She says she's been working outside a lot so she is just "worn out". She said she guesses she'll "just live with  it".   She has a lesion on her nose that has been there for 2 months. She says that it started out looking like a white head, but after she popped it, it seemed deeper than a pimple and wouldn't heal. She puts peroxide and neosporin on it, but it still hasn't healed.   Her hands and feet are numb and tingling. Last night her left leg was so numb, it began to hurt. She took a tylenol and that helped. It's worse when she is laying down. She tries to keep her legs elevated when possible. 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   She is concerned to take much gabapentin because it makes her sleepy.   She sleeps very well at night.   She feels overwhelmed because she is an only child and her mother is 35 and lives alone. She has to take care  of her very often. Her mother is in and out of the hospital and declining fast, but she is very stubborn and requires a lot of attention and work. She is trying to slow down and not do as much. She doesn't have the energy for it all. A home nurse has been helping for a few days.   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   She has had her flu shot.   Denies abdominal pain, diarrhea, or constipation. She is up to date on her colonoscopy. Oncologically, she has no other complaints.   Review of Systems  Constitutional: Positive for malaise/fatigue.  HENT: Negative.   Eyes: Negative.   Respiratory: Negative.   Cardiovascular: Negative.   Gastrointestinal: Negative.  Negative for abdominal pain, constipation and diarrhea.  Genitourinary: Negative.   Musculoskeletal: Negative.        Arthritis in right shoulder.   Skin: Negative.        lesion on right side of nose that has not healed in 2 months.   Neurological: Positive for tingling (hands and feet).  Endo/Heme/Allergies: Negative.   Psychiatric/Behavioral: The patient is nervous/anxious (about taking care of mother). The patient does not have insomnia.   All other systems reviewed and are negative.  14 point review of systems was performed and is negative except as detailed under history of present illness and above   Past Medical History:  Diagnosis Date  . Adenocarcinoma of sigmoid colon ([place]) 11/14/2010  . Arthritis   . Breast CA ([place]) 11/14/2010  . Colon cancer ([place]) 11/14/2010  . Depression   . Falls   . H/O: hysterectomy   . History of right knee surgery    for bone cancer right knee  . Hypercholesteremia   . Hypertension   . Invasive ductal carcinoma of right breast ([place]) 11/14/2010  . Mitral valve prolapse   . Osteoporosis   . Osteoporosis, senile 03/05/2011  . Peripheral neuropathy ([place])   . 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 S/P cataract surgery    bil eyes    Past  Surgical History:  Procedure Laterality Date  . ABDOMINAL HYSTERECTOMY    . CATARACT EXTRACTION, BILATERAL    . COLONOSCOPY  08/27/2008   Dr. Madolyn Frieze papilla.  Surgical anastomosis at 8-10 cm from the anal verge.  Residual rectal and colonic mucosa appeared normal.  . COLONOSCOPY N/A 08/31/2013   Procedure: COLONOSCOPY;  Surgeon: Daneil Dolin, MD;  Location: [facility];  Service: Endoscopy;  Laterality: N/A;  2:45  . Epidural steroid  injection with Kenalog     Midline  . KNEE SURGERY    . PORT-A-CATH REMOVAL  02/04/2012   Procedure: REMOVAL PORT-A-CATH;  Surgeon: Donato Heinz, MD;  Location: [facility];  Service: General;  Laterality: N/A;  minor room  . retinal hole repair     bilateral  . TONSILLECTOMY    . YAG LASER APPLICATION Left 0/72/1828   Procedure: YAG LASER APPLICATION;  Surgeon: Elta Guadeloupe T. Gershon Crane, MD;  Location: [facility];  Service: Ophthalmology;  Laterality: Left;    Family History  Problem Relation Age of Onset  . Stroke Mother   . Cancer Mother     breast  cancer  . Heart attack Father   . Colon cancer Neg Hx     Social History   Social History  . Marital status: Married    Spouse name: N/A  . Number of children: N/A  . Years of education: N/A   Social History Main Topics  . Smoking status: Never Smoker  . Smokeless tobacco: Never Used  . Alcohol use No  . Drug use: No  . Sexual activity: No   Other Topics Concern  . None   Social History Narrative  . None     PHYSICAL EXAMINATION  ECOG PERFORMANCE STATUS: 1 - Symptomatic but completely ambulatory  Vitals:   03/07/16 1411  BP: (!) 161/51  Pulse: (!) 56  Resp: 16  Temp: 97.9 F (36.6 C)    Physical Exam  Constitutional: She is oriented to person, place, and time and well-developed, well-nourished, and in no distress.  Pt was able to get on exam table without assistance.   HENT:  Head: Normocephalic and atraumatic.  Mouth/Throat: No oropharyngeal exudate.  lesion on right side of nose   Eyes: EOM are normal. Pupils are equal, round, and reactive to light. No scleral icterus.  Neck: Normal range of motion. Neck supple.  Cardiovascular: Normal rate, regular rhythm and normal heart sounds.   Pulmonary/Chest: Effort normal and breath sounds normal.  Abdominal: Soft. Bowel sounds are normal. She exhibits no distension and no mass. There is no tenderness. There is no rebound and no guarding.  Musculoskeletal: Normal range of motion. She exhibits no edema.  Lymphadenopathy:    She has no cervical adenopathy.  Neurological: She is alert and oriented to person, place, and time. Gait normal.  Skin: Skin is warm and dry.  Psychiatric: Mood, memory, affect and judgment normal.  Nursing note and vitals reviewed.  LABORATORY DATA: I have reviewed the data as listed. CBC    Component Value Date/Time   WBC 6.2 03/07/2016 1328   RBC 3.99 03/07/2016 1328   HGB 12.3 03/07/2016 1328   HCT 36.5 03/07/2016 1328   PLT 151 03/07/2016 1328   MCV 91.5 03/07/2016 1328   MCH 30.8 03/07/2016 1328   MCHC 33.7 03/07/2016 1328   RDW 13.8 03/07/2016 1328   LYMPHSABS 1.4 03/07/2016 1328   MONOABS 0.7 03/07/2016 1328   EOSABS 0.1 03/07/2016 1328   BASOSABS 0.0 03/07/2016 1328      Chemistry      Component Value Date/Time   NA 137 03/07/2016 1328   K 3.9 03/07/2016 1328   CL 101 03/07/2016 1328   CO2 28 03/07/2016 1328   BUN 27 (H) 03/07/2016 1328   CREATININE 1.33 (H) 03/07/2016 1328      Component Value Date/Time   CALCIUM 9.0 03/07/2016 1328   ALKPHOS 34 (L) 03/07/2016 1328   AST 18 03/07/2016 1328  ALT 11 (L) 03/07/2016 1328   BILITOT 0.4 03/07/2016 1328     Results for [place], Patricia Kline (MRN [id number]) as of 03/07/2016 10:09  Ref. Range 09/08/2014 12:03 03/11/2015 10:30 09/06/2015 10:04 11/01/2015 10:00 11/09/2015 08:25  Vitamin D, 25-Hydroxy Latest Ref Range: 30.0 - 100.0 ng/mL  14.3 (L) 16.0 (L)         RADIOGRAPHIC STUDIES: I have personally reviewed the radiological images as  listed and agreed with the findings in the report. Study Result   CLINICAL DATA:  Screening.  EXAM: 2D DIGITAL SCREENING BILATERAL MAMMOGRAM WITH CAD AND ADJUNCT TOMO  COMPARISON:  Previous exam(s).  ACR Breast Density Category c: The breast tissue is heterogeneously dense, which may obscure small masses.  FINDINGS: There are no findings suspicious for malignancy. Images were processed with CAD.  IMPRESSION: No mammographic evidence of malignancy. A result letter of this screening mammogram will be mailed directly to the patient.  RECOMMENDATION: Screening mammogram in one year. (Code:[id number]-B-01Y)  BI-RADS CATEGORY  1: Negative.   Electronically Signed   By: Curlene Dolphin M.D.   On: 11/10/2015 08:00    ASSESSMENT AND PLAN:  Stage III CRC of the sigmoid colon Stage I infiltrating ductal carcinoma of R breast ER+ PR+ HER 2 - Osteoporosis Neuropathy Vitamin D deficiency  She continues on prolia. She is on calcium and vitamin D. She will be due for another DEXA next year.There is no good data regarding duration of prolia therapy and she has been on it many years. I have seen data out to 8 years. Will continue for now.  She is due for prolia today.   Last mammogram performed 8/17. Last bone density performed on 11/15/14. She is scheduled for a mammogram screening in 8/18.  5 years worth of surveillance are complete from a colon cancer standpoint. Therefore, NCCN guidelines does not recommend CEA monitoring or surveillance imaging.   From a breast cancer standpoint, we will continue with breast exams and perform annual screening mammograms.  The patient requested for the report from today to be sent to her PCP, Dr. Hilma Favors.   LABS from today are reviewed with the patient, results are noted above.   I will call her with her vitamin D levels from today when the results are in.   I have referred her to dermatology for the area on her nose that has not been  healing.  She does not need any refills.   She will return for a follow up in 6 months with labs and Prolia.   All questions were answered. The patient knows to call the clinic with any problems, questions or concerns. We can certainly see the patient much sooner if necessary.  This document serves as a record of services personally performed by Ancil Linsey, MD. It was created on her behalf by Martinique Casey, a trained medical scribe. The creation of this record is based on the scribe's personal observations and the provider's statements to them. This document has been checked and approved by the attending provider.  I have reviewed the above documentation for accuracy and completeness, and I agree with the above.  This note is electronically signed by: Molli Hazard, MD   03/07/2016 2:38 PM

## 2016-03-08 LAB — VITAMIN D 25 HYDROXY (VIT D DEFICIENCY, FRACTURES): VIT D 25 HYDROXY: 20.6 ng/mL — AB (ref 30.0–100.0)

## 2016-03-08 LAB — CEA: CEA: 1.8 ng/mL (ref 0.0–4.7)

## 2016-03-14 DIAGNOSIS — C44311 Basal cell carcinoma of skin of nose: Secondary | ICD-10-CM | POA: Diagnosis not present

## 2016-03-14 DIAGNOSIS — L821 Other seborrheic keratosis: Secondary | ICD-10-CM | POA: Diagnosis not present

## 2016-04-30 ENCOUNTER — Encounter (HOSPITAL_COMMUNITY): Payer: Self-pay | Admitting: Hematology & Oncology

## 2016-05-14 DIAGNOSIS — C44311 Basal cell carcinoma of skin of nose: Secondary | ICD-10-CM | POA: Diagnosis not present

## 2016-09-03 ENCOUNTER — Other Ambulatory Visit (HOSPITAL_COMMUNITY): Payer: Self-pay | Admitting: *Deleted

## 2016-09-03 DIAGNOSIS — C187 Malignant neoplasm of sigmoid colon: Secondary | ICD-10-CM

## 2016-09-03 DIAGNOSIS — C50911 Malignant neoplasm of unspecified site of right female breast: Secondary | ICD-10-CM

## 2016-09-05 ENCOUNTER — Encounter (HOSPITAL_COMMUNITY): Payer: Medicare Other | Attending: Oncology

## 2016-09-05 ENCOUNTER — Encounter (HOSPITAL_BASED_OUTPATIENT_CLINIC_OR_DEPARTMENT_OTHER): Payer: Medicare Other | Admitting: Adult Health

## 2016-09-05 ENCOUNTER — Encounter (HOSPITAL_COMMUNITY): Payer: Self-pay | Admitting: Adult Health

## 2016-09-05 ENCOUNTER — Ambulatory Visit (HOSPITAL_COMMUNITY): Payer: Medicare Other | Admitting: Adult Health

## 2016-09-05 ENCOUNTER — Other Ambulatory Visit (HOSPITAL_COMMUNITY): Payer: Medicare Other

## 2016-09-05 VITALS — BP 138/45 | HR 51 | Resp 16 | Ht 64.0 in | Wt 109.0 lb

## 2016-09-05 DIAGNOSIS — C50919 Malignant neoplasm of unspecified site of unspecified female breast: Secondary | ICD-10-CM

## 2016-09-05 DIAGNOSIS — E559 Vitamin D deficiency, unspecified: Secondary | ICD-10-CM | POA: Insufficient documentation

## 2016-09-05 DIAGNOSIS — M818 Other osteoporosis without current pathological fracture: Secondary | ICD-10-CM

## 2016-09-05 DIAGNOSIS — C187 Malignant neoplasm of sigmoid colon: Secondary | ICD-10-CM | POA: Insufficient documentation

## 2016-09-05 DIAGNOSIS — C50911 Malignant neoplasm of unspecified site of right female breast: Secondary | ICD-10-CM | POA: Diagnosis not present

## 2016-09-05 DIAGNOSIS — Z1231 Encounter for screening mammogram for malignant neoplasm of breast: Secondary | ICD-10-CM

## 2016-09-05 DIAGNOSIS — M810 Age-related osteoporosis without current pathological fracture: Secondary | ICD-10-CM | POA: Diagnosis not present

## 2016-09-05 DIAGNOSIS — G620 Drug-induced polyneuropathy: Secondary | ICD-10-CM

## 2016-09-05 DIAGNOSIS — Z85038 Personal history of other malignant neoplasm of large intestine: Secondary | ICD-10-CM | POA: Diagnosis not present

## 2016-09-05 DIAGNOSIS — C189 Malignant neoplasm of colon, unspecified: Secondary | ICD-10-CM

## 2016-09-05 LAB — COMPREHENSIVE METABOLIC PANEL
ALT: 11 U/L — ABNORMAL LOW (ref 14–54)
ANION GAP: 8 (ref 5–15)
AST: 16 U/L (ref 15–41)
Albumin: 4.4 g/dL (ref 3.5–5.0)
Alkaline Phosphatase: 38 U/L (ref 38–126)
BUN: 18 mg/dL (ref 6–20)
CHLORIDE: 101 mmol/L (ref 101–111)
CO2: 28 mmol/L (ref 22–32)
Calcium: 9.1 mg/dL (ref 8.9–10.3)
Creatinine, Ser: 1.08 mg/dL — ABNORMAL HIGH (ref 0.44–1.00)
GFR, EST AFRICAN AMERICAN: 57 mL/min — AB (ref >60)
GFR, EST NON AFRICAN AMERICAN: 49 mL/min — AB (ref >60)
Glucose, Bld: 135 mg/dL — ABNORMAL HIGH (ref 65–99)
Potassium: 3.6 mmol/L (ref 3.5–5.1)
SODIUM: 137 mmol/L (ref 135–145)
TOTAL PROTEIN: 7 g/dL (ref 6.5–8.1)
Total Bilirubin: 0.7 mg/dL (ref 0.3–1.2)

## 2016-09-05 LAB — CBC WITH DIFFERENTIAL/PLATELET
Basophils Absolute: 0 10*3/uL (ref 0.0–0.1)
Basophils Relative: 0 %
EOS ABS: 0.1 10*3/uL (ref 0.0–0.7)
EOS PCT: 2 %
HCT: 37.9 % (ref 36.0–46.0)
Hemoglobin: 13 g/dL (ref 12.0–15.0)
LYMPHS ABS: 1.3 10*3/uL (ref 0.7–4.0)
Lymphocytes Relative: 23 %
MCH: 30.8 pg (ref 26.0–34.0)
MCHC: 34.3 g/dL (ref 30.0–36.0)
MCV: 89.8 fL (ref 78.0–100.0)
MONOS PCT: 10 %
Monocytes Absolute: 0.5 10*3/uL (ref 0.1–1.0)
Neutro Abs: 3.6 10*3/uL (ref 1.7–7.7)
Neutrophils Relative %: 65 %
PLATELETS: 144 10*3/uL — AB (ref 150–400)
RBC: 4.22 MIL/uL (ref 3.87–5.11)
RDW: 13.3 % (ref 11.5–15.5)
WBC: 5.5 10*3/uL (ref 4.0–10.5)

## 2016-09-05 MED ORDER — DENOSUMAB 60 MG/ML ~~LOC~~ SOLN
60.0000 mg | Freq: Once | SUBCUTANEOUS | Status: AC
  Administered 2016-09-05: 60 mg via SUBCUTANEOUS
  Filled 2016-09-05: qty 1

## 2016-09-05 MED ORDER — GABAPENTIN 300 MG PO CAPS: ORAL_CAPSULE | ORAL | 6 refills | Status: AC

## 2016-09-05 NOTE — Progress Notes (Signed)
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) AND Osteoporosis   CURRENT THERAPY: Surveillance of both malignancies AND Prolia every 6 months    BRIEF ONCOLOGIC HISTORY:    Invasive ductal carcinoma of right breast ([facility])   07/19/2004 Imaging    PET- increased uptake in posterior aspect of right breast      08/09/2004 Imaging    Mammogram and [place]      08/09/2004 Initial Diagnosis    Invasive ductal carcinoma of right breast on needle core biopsy of right breast      08/22/2004 Imaging    MRI B/L Breast- 1.8 x 1.4 x 1.3 cm right breast mass      08/29/2004 - 12/13/2004 Chemotherapy    Arimidex x 4 months neoadjuvantly- 77% shrinkage by volume size      12/20/2004 Surgery    Right partial mastectomy- 2 cm, ER 89%, PR 10%, Her2 negative, Ki-67 16%.        02/06/2005 - 03/28/2005 Radiation Therapy    By Dr. Elba Barman      05/15/2005 - 05/06/2007 Chemotherapy    Femara      08/29/2005 Imaging    Mammogram s/p chemo and resection.  BIRADS 2      05/06/2007 - 09/06/2009 Chemotherapy    Tamoxifen       Adenocarcinoma of sigmoid colon ([facility])   05/29/2004 Initial Diagnosis    Adenocarcinoma of sigmoid colon      05/29/2004 Surgery    Segmental resection, 6 cm maximum size, adenocarcinoma, 6/15 lymph nodes positive for disease, T3N2      07/25/2004 - 11/15/2004 Chemotherapy    Xeloda + Oxaliplatin, Avastin x 6 cycles.  Avastin d/c after cycle 1       Remission          INTERVAL HISTORY:  Patricia Kline 76 y.o. female returns for follow-up for her history of colon cancer, breast cancer, and osteoporosis.   She is here today unaccompanied. Overall, she tells me that she  feels pretty well. She has recently been stressed, as she has had to place her 2 year old mother in a skilled nursing facility about 2 months ago. She states "my nerves have been bad lately"; she takes Xanax for anxiety periodically. She has some fatigue; reports that her energy levels are about 75%. States that her appetite is currently at 100%, but was recently lower over the past 2 months with family stressors.  She states that her appetite "is picking back up again." Chart reviewed; she has lost about 4 pounds since her last visit 6 months ago. She wants to know "why do I feel cold all the time?"  Endorses worsening peripheral neuropathy to her hands and feet. Remains on gabapentin 300 mg 4 times a day. She states that her neuropathy is worse in the late afternoon and early evening. It does not interfere with her ability to sleep, as she takes the gabapentin and Xanax at bedtime which helps her rest. She is wondering if she can take more gabapentin, or if there is another medicine that may be helpful for her neuropathy.  She denies any new concerns in her breasts. Denies any changes in her bowel habits.  Denies any constipation, diarrhea, nausea or vomiting, abdominal pain, blood in her stool, or dark/tarry stools.  She did recently undergo surgery to her nose in 05/2016; she was found to have a basal cell carcinoma. She reported that she had quite a bit of swelling to her nose, requiring a cortisone injection in 05/2016. She follows up with dermatology regularly.  She is due for her Prolia injection today. Otherwise, she feels well.   REVIEW OF SYSTEMS:  Review of Systems  Constitutional: Positive for fatigue and unexpected weight change. Negative for chills and fever.  HENT:  Negative.  Negative for lump/mass and nosebleeds.   Eyes: Negative.   Respiratory: Negative.  Negative for cough and shortness of breath.   Cardiovascular: Negative.  Negative for chest pain and leg swelling.    Gastrointestinal: Negative.  Negative for abdominal pain, blood in stool, constipation, diarrhea, nausea and vomiting.  Endocrine: Negative.        Cold intolerance  Genitourinary: Negative.  Negative for dysuria, hematuria and vaginal bleeding.   Musculoskeletal: Negative.  Negative for arthralgias.  Skin: Negative.  Negative for rash.  Neurological: Positive for numbness. Negative for dizziness and headaches.  Hematological: Negative.  Negative for adenopathy. Does not bruise/bleed easily.  Psychiatric/Behavioral: Negative for depression and sleep disturbance. The patient is nervous/anxious.      PAST MEDICAL/SURGICAL HISTORY:  Past Medical History:  Diagnosis Date  . Adenocarcinoma of sigmoid colon (HCC) 11/14/2010  . Arthritis   . Breast CA (HCC) 11/14/2010  . Colon cancer (HCC) 11/14/2010  . Depression   . Falls   . H/O: hysterectomy   . History of right knee surgery    for bone cancer right knee  . Hypercholesteremia   . Hypertension   . Invasive ductal carcinoma of right breast (HCC) 11/14/2010  . Mitral valve prolapse   . Osteoporosis   . Osteoporosis, senile 03/05/2011  . Peripheral neuropathy   . 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 S/P cataract surgery    bil eyes   Past Surgical History:  Procedure Laterality Date  . ABDOMINAL HYSTERECTOMY    . CATARACT EXTRACTION, BILATERAL    . COLONOSCOPY  08/27/2008   Dr. Marlowe Alt papilla.  Surgical anastomosis at 8-10 cm from the anal verge.  Residual rectal and colonic mucosa appeared normal.  . COLONOSCOPY N/A 08/31/2013   Procedure: COLONOSCOPY;  Surgeon: Corbin Ade, MD;  Location: [facility];  Service: Endoscopy;  Laterality: N/A;  2:45  . Epidural steroid injection with Kenalog     Midline  . KNEE SURGERY    . PORT-A-CATH REMOVAL  02/04/2012   Procedure: REMOVAL PORT-A-CATH;  Surgeon: Fabio Bering, MD;  Location: [facility];  Service: General;  Laterality: N/A;  minor room  .  retinal hole repair     bilateral  . TONSILLECTOMY    . YAG LASER APPLICATION Left 09/23/2012   Procedure: YAG LASER APPLICATION;  Surgeon: Loraine Leriche T. Nile Riggs, MD;  Location: [facility];  Service: Ophthalmology;  Laterality: Left;     SOCIAL HISTORY:  Social History   Social History  . Marital status: Married    Spouse name: N/A  . Number of children: N/A  . Years of education: N/A   Occupational History  . Not on file.   Social History Main Topics  . Smoking status: Never Smoker  . Smokeless tobacco: Never Used  . Alcohol use No  . Drug use: No  . Sexual activity: No  Other Topics Concern  . Not on file   Social History Narrative  . No narrative on file    FAMILY HISTORY:  Family History  Problem Relation Age of Onset  . Stroke Mother   . Cancer Mother        breast  cancer  . Heart attack Father   . Colon cancer Neg Hx     CURRENT MEDICATIONS:  Outpatient Encounter Prescriptions as of 09/05/2016  Medication Sig Note  . acetaminophen (TYLENOL) 325 MG tablet Take 650 mg by mouth as needed for mild pain.    Marland Kitchen ALPRAZolam (XANAX) 1 MG tablet Take 1 mg by mouth at bedtime as needed for sleep. Reported on 09/06/2015   . denosumab (PROLIA) 60 MG/ML SOLN injection Inject 60 mg into the skin every 6 (six) months. Administer in upper arm, thigh, or abdomen 01/12/2014: Due in December 2015  . gabapentin (NEURONTIN) 300 MG capsule Take 1 cap at breakfast. Take 2 cap at lunch. Take 2 cap at dinner. Take 1 cap at bedtime.   Marland Kitchen lisinopril-hydrochlorothiazide (PRINZIDE,ZESTORETIC) 20-12.5 MG per tablet Take 1 tablet by mouth daily. Reported on 09/06/2015   . [DISCONTINUED] gabapentin (NEURONTIN) 300 MG capsule Take 1 capsule (300 mg total) by mouth 4 (four) times daily.   . [DISCONTINUED] ergocalciferol (VITAMIN D2) 50000 units capsule Take 1 capsule (50,000 Units total) by mouth once a week.   . [DISCONTINUED] LORazepam (ATIVAN) 1 MG tablet Take 1 mg by mouth every 8 (eight) hours. Reported  on 09/06/2015    No facility-administered encounter medications on file as of 09/05/2016.     ALLERGIES:  Allergies  Allergen Reactions  . Penicillins      PHYSICAL EXAM:  ECOG Performance status: 0-1 - Largely asymptomatic; remains independent   Vitals:   09/05/16 1320  BP: (!) 138/45  Pulse: (!) 51  Resp: 16   Filed Weights   09/05/16 1320  Weight: 109 lb (49.4 kg)    Physical Exam  Constitutional: She is oriented to person, place, and time and well-developed, well-nourished, and in no distress.  HENT:  Head: Normocephalic.  Mouth/Throat: Oropharynx is clear and moist. No oropharyngeal exudate.  Eyes: Conjunctivae are normal. Pupils are equal, round, and reactive to light. No scleral icterus.  Neck: Normal range of motion. Neck supple.  Cardiovascular:  Bradycardic; irregularly regular rhythm  Pulmonary/Chest: Effort normal and breath sounds normal. No respiratory distress.    Abdominal: Soft. Bowel sounds are normal. There is no tenderness. There is no rebound and no guarding.  Musculoskeletal: Normal range of motion. She exhibits no edema.  Lymphadenopathy:    She has no cervical adenopathy.    She has no axillary adenopathy.       Right: No supraclavicular adenopathy present.       Left: No supraclavicular adenopathy present.  Neurological: She is alert and oriented to person, place, and time. No cranial nerve deficit. Gait normal.  Skin: Skin is warm and dry. No rash noted.  Psychiatric: Mood, memory, affect and judgment normal.  Nursing note and vitals reviewed.    LABORATORY DATA:  I have reviewed the labs as listed.  CBC    Component Value Date/Time   WBC 5.5 09/05/2016 1212   RBC 4.22 09/05/2016 1212   HGB 13.0 09/05/2016 1212   HCT 37.9 09/05/2016 1212   PLT 144 (L) 09/05/2016 1212   MCV 89.8 09/05/2016 1212   MCH 30.8 09/05/2016 1212   MCHC 34.3 09/05/2016 1212   RDW 13.3  09/05/2016 1212   LYMPHSABS 1.3 09/05/2016 1212   MONOABS 0.5  09/05/2016 1212   EOSABS 0.1 09/05/2016 1212   BASOSABS 0.0 09/05/2016 1212   CMP Latest Ref Rng & Units 09/05/2016 03/07/2016 09/06/2015  Glucose 65 - 99 mg/dL 135(H) 93 87  BUN 6 - 20 mg/dL 18 27(H) 19  Creatinine 0.44 - 1.00 mg/dL 1.08(H) 1.33(H) 1.02(H)  Sodium 135 - 145 mmol/L 137 137 138  Potassium 3.5 - 5.1 mmol/L 3.6 3.9 3.9  Chloride 101 - 111 mmol/L 101 101 104  CO2 22 - 32 mmol/L _0 Calcium 8.9 - 10.3 mg/dL 9.1 9.0 9.3  Total Protein 6.5 - 8.1 g/dL 7.0 7.0 7.3  Total Bilirubin 0.3 - 1.2 mg/dL 0.7 0.4 0.6  Alkaline Phos 38 - 126 U/L 38 34(L) 32(L)  AST 15 - 41 U/L _1 ALT 14 - 54 U/L 11(L) 11(L) 16    PENDING LABS:    DIAGNOSTIC IMAGING:  *The following radiologic images and reports have been reviewed independently and agree with below findings.  Last mammogram: 11/09/15    Last DEXA scan: 11/15/14   PATHOLOGY:        ASSESSMENT & PLAN:    Stage III adenocarcinoma of sigmoid colon:  -Diagnosed in 05/2004. Treated with segmental resection, followed by adjuvant chemo with Xeloda/Oxaliplatin x 6 cycles; received Avastin with cycle #1, then Avastin discontinued. She has been in remission since that time.  -Last colonoscopy 08/31/13 with Dr. Gala Romney; recommended repeat exam in 5 years (09/2018).  -CEA serial monitoring has been normal. No need to continue CEA monitoring beyond 5 years per NCCN Guidelines.  -Return to cancer center in 6 months for continued follow-up.   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 Treated with neoadjuvant anti-estrogen therapy with Arimidex x 4 months. Then underwent right lumpectomy, followed by adjuvant radiation therapy. Started on Femara in 05/2005 and continued through 05/2007; then switched to Tamoxifen from 05/2007-08/2009.  -Last mammogram 11/2015 without evidence of recurrence.  -Clinical breast exam benign today.  -Return to cancer center in 6 months for continued follow-up.   Bone  health:  -Osteoporosis noted on last DEXA scan in 11/2014 with T-score -2.6.  -Continue Prolia injections every 6 months.  -Biennial DEXA imaging due in 10/2016; orders placed today.   Peripheral neuropathy: -Currently on gabapentin 300 mg 4 times a day with inadequate control of her symptoms. -Discussed the option of either increasing her dose of gabapentin vss starting Cymbalta for chemotherapy-induced peripheral neuropathy. -She prefers not to start any medication at this time, which is certainly reasonable. Since her symptoms are worse in the late afternoon/early evening, we discussed the option to only increase 2 doses of 4 per day of her gabapentin. She is agreeable to this plan. Immune paper prescription was printed for the patient with the following instructions:  *Take 1 tab (300 mg) at breakfast time.   *Take 2 tabs (600 mg) at lunch time.   *Take 2 tabs (600 mg) at dinner time.   *Take 1 tab (300 mg) at bedtime.  -Encouraged her to call us and let us know if this dose modification of gabapentin is not effective.   Cold intolerance: -Labs reviewed; hemoglobin is normal and stable. Cold intolerance not likely to be secondary to her history of colon or breast cancers.  -Could be secondary to thyroid dysfunction. Encouraged her to follow-up with her PCP regarding these concerns.  Dispo:  -Annual mammogram due in 10/2016; orders placed today.  -Biennial DEXA scan due in 10/2016; orders placed toady.  -Return to cancer center in 6 months for follow-up, Prolia injection, and labs.    All questions were answered to patient's stated satisfaction. Encouraged patient to call with any new concerns or questions before her next visit to the cancer center and we can certain see her sooner, if needed.    Plan of care discussed with Dr. Talbert Cage, who agrees with the above aforementioned.    Orders placed this encounter:  Orders Placed This Encounter  Procedures  . MM SCREENING BREAST TOMO  BILATERAL  . DG Bone Density  . CBC with Differential/Platelet  . Comprehensive metabolic panel      Mike Craze, NP [place] [phone]

## 2016-09-05 NOTE — Progress Notes (Signed)
Patricia Kline presents today for injection per the provider's orders.  Prolia administration without incident; see MAR for injection details.  Patient tolerated procedure well and without incident.  No questions or complaints noted at this time.  Discharged ambulatory.

## 2016-09-05 NOTE — Patient Instructions (Signed)
[facility] at [facility] Discharge Instructions  RECOMMENDATIONS MADE BY THE CONSULTANT AND ANY TEST RESULTS WILL BE SENT TO YOUR REFERRING PHYSICIAN.  You were seen today by Gretchen Dawson NP.   Thank you for choosing  Cancer Center at [facility] to provide your oncology and hematology care.  To afford each patient quality time with our provider, please arrive at least 15 minutes before your scheduled appointment time.    If you have a lab appointment with the Cancer Center please come in thru the  Main Entrance and check in at the main information desk  You need to re-schedule your appointment should you arrive 10 or more minutes late.  We strive to give you quality time with our providers, and arriving late affects you and other patients whose appointments are after yours.  Also, if you no show three or more times for appointments you may be dismissed from the clinic at the providers discretion.     Again, thank you for choosing [facility].  Our hope is that these requests will decrease the amount of time that you wait before being seen by our physicians.       _____________________________________________________________  Should you have questions after your visit to [facility], please contact our office at [phone] between the hours of 8:30 a.m. and 4:30 p.m.  Voicemails left after 4:30 p.m. will not be returned until the following business day.  For prescription refill requests, have your pharmacy contact our office.       Resources For Cancer Patients and their Caregivers ? American Cancer Society: Can assist with transportation, wigs, general needs, runs Look Good Feel Better.        1-888-227-6333 ? Cancer Care: Provides financial assistance, online support groups, medication/co-pay assistance.  1-800-813-HOPE (4673) ? [facility] Assists [place] cancer patients and  their families through emotional , educational and financial support.  336-427-4357 ? [place] DSS Where to apply for food stamps, Medicaid and utility assistance. 336-342-1394 ? RCATS: Transportation to medical appointments. 336-347-2287 ? Social Security Administration: May apply for disability if have a Stage IV cancer. 336-342-7796 1-800-772-1213 ? [place] Aging, Disability and Transit Services: Assists with nutrition, care and transit needs. 336-349-2343  Cancer Center Support Programs: @10RELATIVEDAYS@ > Cancer Support Group  2nd Tuesday of the month 1pm-2pm, Journey Room  > Creative Journey  3rd Tuesday of the month 1130am-1pm, Journey Room  > Look Good Feel Better  1st Wednesday of the month 10am-12 noon, Journey Room (Call American Cancer Society to register 1-800-395-5775)    

## 2016-10-12 DIAGNOSIS — Z1389 Encounter for screening for other disorder: Secondary | ICD-10-CM | POA: Diagnosis not present

## 2016-10-12 DIAGNOSIS — Z681 Body mass index (BMI) 19 or less, adult: Secondary | ICD-10-CM | POA: Diagnosis not present

## 2016-10-12 DIAGNOSIS — F419 Anxiety disorder, unspecified: Secondary | ICD-10-CM | POA: Diagnosis not present

## 2016-10-12 DIAGNOSIS — F329 Major depressive disorder, single episode, unspecified: Secondary | ICD-10-CM | POA: Diagnosis not present

## 2016-11-19 ENCOUNTER — Ambulatory Visit (HOSPITAL_COMMUNITY)
Admission: RE | Admit: 2016-11-19 | Discharge: 2016-11-19 | Disposition: A | Discharge status: Home / Self Care | Payer: Medicare Other | Source: Ambulatory Visit | Attending: Adult Health | Admitting: Adult Health

## 2016-11-19 DIAGNOSIS — M810 Age-related osteoporosis without current pathological fracture: Secondary | ICD-10-CM | POA: Insufficient documentation

## 2016-11-19 DIAGNOSIS — Z1231 Encounter for screening mammogram for malignant neoplasm of breast: Secondary | ICD-10-CM | POA: Diagnosis not present

## 2016-11-19 DIAGNOSIS — Z780 Asymptomatic menopausal state: Secondary | ICD-10-CM | POA: Diagnosis not present

## 2017-01-24 DIAGNOSIS — Z23 Encounter for immunization: Secondary | ICD-10-CM | POA: Diagnosis not present

## 2017-01-24 DIAGNOSIS — F329 Major depressive disorder, single episode, unspecified: Secondary | ICD-10-CM | POA: Diagnosis not present

## 2017-01-24 DIAGNOSIS — M810 Age-related osteoporosis without current pathological fracture: Secondary | ICD-10-CM | POA: Diagnosis not present

## 2017-01-24 DIAGNOSIS — N183 Chronic kidney disease, stage 3 (moderate): Secondary | ICD-10-CM | POA: Diagnosis not present

## 2017-01-24 DIAGNOSIS — I10 Essential (primary) hypertension: Secondary | ICD-10-CM | POA: Diagnosis not present

## 2017-01-24 DIAGNOSIS — Z681 Body mass index (BMI) 19 or less, adult: Secondary | ICD-10-CM | POA: Diagnosis not present

## 2017-01-24 DIAGNOSIS — Z1389 Encounter for screening for other disorder: Secondary | ICD-10-CM | POA: Diagnosis not present

## 2017-01-24 DIAGNOSIS — M1991 Primary osteoarthritis, unspecified site: Secondary | ICD-10-CM | POA: Diagnosis not present

## 2017-01-24 DIAGNOSIS — Z0001 Encounter for general adult medical examination with abnormal findings: Secondary | ICD-10-CM | POA: Diagnosis not present

## 2017-02-19 DIAGNOSIS — Z961 Presence of intraocular lens: Secondary | ICD-10-CM | POA: Diagnosis not present

## 2017-02-19 DIAGNOSIS — H353131 Nonexudative age-related macular degeneration, bilateral, early dry stage: Secondary | ICD-10-CM | POA: Diagnosis not present

## 2017-02-19 DIAGNOSIS — H35341 Macular cyst, hole, or pseudohole, right eye: Secondary | ICD-10-CM | POA: Diagnosis not present

## 2017-03-07 ENCOUNTER — Other Ambulatory Visit (HOSPITAL_COMMUNITY): Payer: Medicare Other

## 2017-03-07 ENCOUNTER — Ambulatory Visit (HOSPITAL_COMMUNITY): Payer: Medicare Other | Admitting: Oncology

## 2017-03-07 ENCOUNTER — Ambulatory Visit (HOSPITAL_COMMUNITY): Payer: Medicare Other

## 2017-03-11 ENCOUNTER — Ambulatory Visit (HOSPITAL_COMMUNITY): Payer: Medicare Other

## 2017-03-11 ENCOUNTER — Other Ambulatory Visit (HOSPITAL_COMMUNITY): Payer: Medicare Other

## 2017-03-13 ENCOUNTER — Encounter (HOSPITAL_COMMUNITY): Payer: Self-pay

## 2017-03-13 ENCOUNTER — Encounter (HOSPITAL_COMMUNITY): Payer: Medicare Other | Attending: Adult Health

## 2017-03-13 ENCOUNTER — Encounter (HOSPITAL_BASED_OUTPATIENT_CLINIC_OR_DEPARTMENT_OTHER): Payer: Medicare Other

## 2017-03-13 ENCOUNTER — Ambulatory Visit (HOSPITAL_COMMUNITY): Payer: Medicare Other

## 2017-03-13 ENCOUNTER — Encounter (HOSPITAL_BASED_OUTPATIENT_CLINIC_OR_DEPARTMENT_OTHER): Payer: Medicare Other | Admitting: Oncology

## 2017-03-13 ENCOUNTER — Other Ambulatory Visit (HOSPITAL_COMMUNITY): Payer: Medicare Other

## 2017-03-13 VITALS — BP 137/51 | HR 16 | Temp 97.6°F | Resp 16 | Wt 111.1 lb

## 2017-03-13 DIAGNOSIS — Z79899 Other long term (current) drug therapy: Secondary | ICD-10-CM | POA: Diagnosis not present

## 2017-03-13 DIAGNOSIS — I10 Essential (primary) hypertension: Secondary | ICD-10-CM | POA: Diagnosis not present

## 2017-03-13 DIAGNOSIS — M818 Other osteoporosis without current pathological fracture: Secondary | ICD-10-CM

## 2017-03-13 DIAGNOSIS — Z85038 Personal history of other malignant neoplasm of large intestine: Secondary | ICD-10-CM

## 2017-03-13 DIAGNOSIS — C187 Malignant neoplasm of sigmoid colon: Secondary | ICD-10-CM | POA: Diagnosis not present

## 2017-03-13 DIAGNOSIS — Z923 Personal history of irradiation: Secondary | ICD-10-CM | POA: Insufficient documentation

## 2017-03-13 DIAGNOSIS — C50919 Malignant neoplasm of unspecified site of unspecified female breast: Secondary | ICD-10-CM | POA: Diagnosis not present

## 2017-03-13 DIAGNOSIS — Z823 Family history of stroke: Secondary | ICD-10-CM | POA: Insufficient documentation

## 2017-03-13 DIAGNOSIS — Z803 Family history of malignant neoplasm of breast: Secondary | ICD-10-CM | POA: Diagnosis not present

## 2017-03-13 DIAGNOSIS — G629 Polyneuropathy, unspecified: Secondary | ICD-10-CM | POA: Insufficient documentation

## 2017-03-13 DIAGNOSIS — Z853 Personal history of malignant neoplasm of breast: Secondary | ICD-10-CM

## 2017-03-13 DIAGNOSIS — M810 Age-related osteoporosis without current pathological fracture: Secondary | ICD-10-CM | POA: Diagnosis not present

## 2017-03-13 DIAGNOSIS — Z170 Estrogen receptor positive status [ER+]: Secondary | ICD-10-CM | POA: Insufficient documentation

## 2017-03-13 DIAGNOSIS — C189 Malignant neoplasm of colon, unspecified: Secondary | ICD-10-CM

## 2017-03-13 DIAGNOSIS — E7800 Pure hypercholesterolemia, unspecified: Secondary | ICD-10-CM | POA: Insufficient documentation

## 2017-03-13 DIAGNOSIS — Z9842 Cataract extraction status, left eye: Secondary | ICD-10-CM | POA: Insufficient documentation

## 2017-03-13 DIAGNOSIS — Z880 Allergy status to penicillin: Secondary | ICD-10-CM | POA: Diagnosis not present

## 2017-03-13 DIAGNOSIS — Z9841 Cataract extraction status, right eye: Secondary | ICD-10-CM | POA: Insufficient documentation

## 2017-03-13 DIAGNOSIS — Z8249 Family history of ischemic heart disease and other diseases of the circulatory system: Secondary | ICD-10-CM | POA: Insufficient documentation

## 2017-03-13 DIAGNOSIS — Z9221 Personal history of antineoplastic chemotherapy: Secondary | ICD-10-CM | POA: Diagnosis not present

## 2017-03-13 DIAGNOSIS — F329 Major depressive disorder, single episode, unspecified: Secondary | ICD-10-CM | POA: Diagnosis not present

## 2017-03-13 LAB — COMPREHENSIVE METABOLIC PANEL
ALK PHOS: 36 U/L — AB (ref 38–126)
ALT: 11 U/L — AB (ref 14–54)
AST: 19 U/L (ref 15–41)
Albumin: 4.4 g/dL (ref 3.5–5.0)
Anion gap: 9 (ref 5–15)
BUN: 21 mg/dL — AB (ref 6–20)
CHLORIDE: 101 mmol/L (ref 101–111)
CO2: 27 mmol/L (ref 22–32)
CREATININE: 1.16 mg/dL — AB (ref 0.44–1.00)
Calcium: 9.5 mg/dL (ref 8.9–10.3)
GFR calc Af Amer: 52 mL/min — ABNORMAL LOW (ref >60)
GFR, EST NON AFRICAN AMERICAN: 45 mL/min — AB (ref >60)
Glucose, Bld: 144 mg/dL — ABNORMAL HIGH (ref 65–99)
Potassium: 4.2 mmol/L (ref 3.5–5.1)
SODIUM: 137 mmol/L (ref 135–145)
Total Bilirubin: 0.5 mg/dL (ref 0.3–1.2)
Total Protein: 7.1 g/dL (ref 6.5–8.1)

## 2017-03-13 LAB — CBC WITH DIFFERENTIAL/PLATELET
BASOS ABS: 0 10*3/uL (ref 0.0–0.1)
Basophils Relative: 0 %
EOS PCT: 1 %
Eosinophils Absolute: 0.1 10*3/uL (ref 0.0–0.7)
HCT: 40.1 % (ref 36.0–46.0)
HEMOGLOBIN: 13 g/dL (ref 12.0–15.0)
LYMPHS PCT: 22 %
Lymphs Abs: 1.5 10*3/uL (ref 0.7–4.0)
MCH: 30.5 pg (ref 26.0–34.0)
MCHC: 32.4 g/dL (ref 30.0–36.0)
MCV: 94.1 fL (ref 78.0–100.0)
Monocytes Absolute: 0.5 10*3/uL (ref 0.1–1.0)
Monocytes Relative: 7 %
NEUTROS PCT: 70 %
Neutro Abs: 4.9 10*3/uL (ref 1.7–7.7)
PLATELETS: 183 10*3/uL (ref 150–400)
RBC: 4.26 MIL/uL (ref 3.87–5.11)
RDW: 13.3 % (ref 11.5–15.5)
WBC: 7 10*3/uL (ref 4.0–10.5)

## 2017-03-13 MED ORDER — DENOSUMAB 60 MG/ML ~~LOC~~ SOLN
60.0000 mg | Freq: Once | SUBCUTANEOUS | Status: AC
  Administered 2017-03-13: 60 mg via SUBCUTANEOUS
  Filled 2017-03-13: qty 1

## 2017-03-13 NOTE — Patient Instructions (Signed)
[facility] at [facility]  Discharge Instructions:  You were seen by Dr. Zhou today. _______________________________________________________________  Thank you for choosing [facility] at [facility] to provide your oncology and hematology care.  To afford each patient quality time with our providers, please arrive at least 15 minutes before your scheduled appointment.  You need to re-schedule your appointment if you arrive 10 or more minutes late.  We strive to give you quality time with our providers, and arriving late affects you and other patients whose appointments are after yours.  Also, if you no show three or more times for appointments you may be dismissed from the clinic.  Again, thank you for choosing [facility] at [facility]. Our hope is that these requests will allow you access to exceptional care and in a timely manner. _______________________________________________________________  If you have questions after your visit, please contact our office at [phone] between the hours of 8:30 a.m. and 5:00 p.m. Voicemails left after 4:30 p.m. will not be returned until the following business day. _______________________________________________________________  For prescription refill requests, have your pharmacy contact our office. _______________________________________________________________  Recommendations made by the consultant and any test results will be sent to your referring physician. _______________________________________________________________ 

## 2017-03-13 NOTE — Progress Notes (Signed)
Patricia Kline tolerated Prolia injection well without complaints or incident. Calcium 9.5 today and pt denied any tooth,jaw or leg pain as well as any recent or future dental appts prior to administering this medication. Pt discharged self ambulatory in satisfactory condition

## 2017-03-13 NOTE — Patient Instructions (Signed)
[facility] at [facility] Discharge Instructions  RECOMMENDATIONS MADE BY THE CONSULTANT AND ANY TEST RESULTS WILL BE SENT TO YOUR REFERRING PHYSICIAN.  Received Prolia injection today. Follow-up as scheduled. Call clinic for any questions or concerns  Thank you for choosing  Can[facility] Center at [facility] to provide your oncology and hematology care.  To afford each patient quality time with our provider, please arrive at least 15 minutes before your scheduled appointment time.    If you have a lab appointment with the Cancer Center please come in thru the  Main Entrance and check in at the main information desk  You need to re-schedule your appointment should you arrive 10 or more minutes late.  We strive to give you quality time with our providers, and arriving late affects you and other patients whose appointments are after yours.  Also, if you no show three or more times for appointments you may be dismissed from the clinic at the providers discretion.     Again, thank you for choosing [facility].  Our hope is that these requests will decrease the amount of time that you wait before being seen by our physicians.       _____________________________________________________________  Should you have questions after your visit to [facility], please contact our office at [phone] between the hours of 8:30 a.m. and 4:30 p.m.  Voicemails left after 4:30 p.m. will not be returned until the following business day.  For prescription refill requests, have your pharmacy contact our office.       Resources For Cancer Patients and their Caregivers ? American Cancer Society: Can assist with transportation, wigs, general needs, runs Look Good Feel Better.        1-888-227-6333 ? Cancer Care: Provides financial assistance, online support groups, medication/co-pay assistance.  1-800-813-HOPE (4673) ? [facility] Assists [place] cancer patients and their families through emotional , educational and financial support.  336-427-4357 ? [place] DSS Where to apply for food stamps, Medicaid and utility assistance. 336-342-1394 ? RCATS: Transportation to medical appointments. 336-347-2287 ? Social Security Administration: May apply for disability if have a Stage IV cancer. 336-342-7796 1-800-772-1213 ? [place] Aging, Disability and Transit Services: Assists with nutrition, care and transit needs. 336-349-2343  Cancer Center Support Programs: @10RELATIVEDAYS@ > Cancer Support Group  2nd Tuesday of the month 1pm-2pm, Journey Room  > Creative Journey  3rd Tuesday of the month 1130am-1pm, Journey Room  > Look Good Feel Better  1st Wednesday of the month 10am-12 noon, Journey Room (Call American Cancer Society to register 1-800-395-5775)   

## 2017-03-13 NOTE — Progress Notes (Signed)
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) AND Osteoporosis   CURRENT THERAPY: Surveillance of both malignancies AND Prolia every 6 months    BRIEF ONCOLOGIC HISTORY:    Invasive ductal carcinoma of right breast ([place])   07/19/2004 Imaging    PET- increased uptake in posterior aspect of right breast      08/09/2004 Imaging    Mammogram and [place]      08/09/2004 Initial Diagnosis    Invasive ductal carcinoma of right breast on needle core biopsy of right breast      08/22/2004 Imaging    MRI B/L Breast- 1.8 x 1.4 x 1.3 cm right breast mass      08/29/2004 - 12/13/2004 Chemotherapy    Arimidex x 4 months neoadjuvantly- 77% shrinkage by volume size      12/20/2004 Surgery    Right partial mastectomy- 2 cm, ER 89%, PR 10%, Her2 negative, Ki-67 16%.        02/06/2005 - 03/28/2005 Radiation Therapy    By Dr. Elba Barman      05/15/2005 - 05/06/2007 Chemotherapy    Femara      08/29/2005 Imaging    Mammogram s/p chemo and resection.  BIRADS 2      05/06/2007 - 09/06/2009 Chemotherapy    Tamoxifen       Adenocarcinoma of sigmoid colon ([place])   05/29/2004 Initial Diagnosis    Adenocarcinoma of sigmoid colon      05/29/2004 Surgery    Segmental resection, 6 cm maximum size, adenocarcinoma, 6/15 lymph nodes positive for disease, T3N2      07/25/2004 - 11/15/2004 Chemotherapy    Xeloda + Oxaliplatin, Avastin x 6 cycles.  Avastin d/c after cycle 1       Remission          INTERVAL HISTORY:  Ms. Mecum 76 y.o. female returns for follow-up for her history of colon cancer, breast cancer, and osteoporosis.  Patient had her annual mammogram in August 2018 which was negative  for malignancy.  She also had a repeat DEXA scan performed in August 2018 which demonstrated osteoporosis which is unchanged.  Overall patient states that she has been doing well.  She denies any chest pain, shortness of breath, abdominal pain, nausea, vomiting, diarrhea, focal weakness, recent infections.  She has not palpated any new masses on her breasts.  She states that she has been stressed out dealing with organizing her mother's house and farm since her mother moved into an ALF in April.  REVIEW OF SYSTEMS:  Review of Systems  Constitutional: Negative for chills, fatigue, fever and unexpected weight change.  HENT:  Negative.  Negative for lump/mass and nosebleeds.   Eyes: Negative.   Respiratory: Negative.  Negative for cough and shortness of breath.   Cardiovascular: Negative.  Negative for chest pain and leg swelling.  Gastrointestinal: Negative.  Negative for abdominal pain, blood in stool, constipation, diarrhea, nausea and vomiting.  Endocrine: Negative.   Genitourinary: Negative.  Negative for dysuria, hematuria and vaginal bleeding.   Musculoskeletal: Negative.  Negative for arthralgias.  Skin: Negative.  Negative for rash.  Neurological: Negative for dizziness, headaches and numbness.  Hematological: Negative.  Negative for adenopathy. Does  not bruise/bleed easily.  Psychiatric/Behavioral: Negative for depression and sleep disturbance. The patient is nervous/anxious.      PAST MEDICAL/SURGICAL HISTORY:  Past Medical History:  Diagnosis Date  . Adenocarcinoma of sigmoid colon ([place]) 11/14/2010  . Arthritis   . Breast CA ([place]) 11/14/2010  . Colon cancer ([place]) 11/14/2010  . Depression   . Falls   . H/O: hysterectomy   . History of right knee surgery    for bone cancer right knee  . Hypercholesteremia   . Hypertension   . Invasive ductal carcinoma of right breast ([place]) 11/14/2010  . Mitral valve prolapse   . Osteoporosis   . Osteoporosis, senile 03/05/2011  . Peripheral  neuropathy   . 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 S/P cataract surgery    bil eyes   Past Surgical History:  Procedure Laterality Date  . ABDOMINAL HYSTERECTOMY    . CATARACT EXTRACTION, BILATERAL    . COLONOSCOPY  08/27/2008   Dr. Madolyn Frieze papilla.  Surgical anastomosis at 8-10 cm from the anal verge.  Residual rectal and colonic mucosa appeared normal.  . COLONOSCOPY N/A 08/31/2013   Procedure: COLONOSCOPY;  Surgeon: Daneil Dolin, MD;  Location: [facility];  Service: Endoscopy;  Laterality: N/A;  2:45  . Epidural steroid injection with Kenalog     Midline  . KNEE SURGERY    . PORT-A-CATH REMOVAL  02/04/2012   Procedure: REMOVAL PORT-A-CATH;  Surgeon: Donato Heinz, MD;  Location: [facility];  Service: General;  Laterality: N/A;  minor room  . retinal hole repair     bilateral  . TONSILLECTOMY    . YAG LASER APPLICATION Left 6/73/4193   Procedure: YAG LASER APPLICATION;  Surgeon: Elta Guadeloupe T. Gershon Crane, MD;  Location: [facility];  Service: Ophthalmology;  Laterality: Left;     SOCIAL HISTORY:  Social History   Socioeconomic History  . Marital status: Married    Spouse name: Not on file  . Number of children: Not on file  . Years of education: Not on file  . Highest education level: Not on file  Social Needs  . Financial resource strain: Not on file  . Food insecurity - worry: Not on file  . Food insecurity - inability: Not on file  . Transportation needs - medical: Not on file  . Transportation needs - non-medical: Not on file  Occupational History  . Not on file  Tobacco Use  . Smoking status: Never Smoker  . Smokeless tobacco: Never Used  Substance and Sexual Activity  . Alcohol use: No  . Drug use: No  . Sexual activity: No  Other Topics Concern  . Not on file  Social History Narrative  . Not on file    FAMILY HISTORY:  Family History  Problem Relation Age of Onset  . Stroke Mother   . Cancer Mother        breast  cancer  .  Heart attack Father   . Colon cancer Neg Hx     CURRENT MEDICATIONS:  Outpatient Encounter Medications as of 03/13/2017  Medication Sig Note  . acetaminophen (TYLENOL) 325 MG tablet Take 650 mg by mouth as needed for mild pain.    Marland Kitchen denosumab (PROLIA) 60 MG/ML SOLN injection Inject 60 mg into the skin every 6 (six) months. Administer in upper arm, thigh, or abdomen 01/12/2014: Due in December 2015  . gabapentin (NEURONTIN) 300 MG capsule Take 1 cap at breakfast. Take 2 cap at lunch.  Take 2 cap at dinner. Take 1 cap at bedtime.   Marland Kitchen lisinopril-hydrochlorothiazide (PRINZIDE,ZESTORETIC) 20-12.5 MG per tablet Take 1 tablet by mouth daily. Reported on 09/06/2015   . LORazepam (ATIVAN) 1 MG tablet Take 1 mg by mouth every 8 (eight) hours.   . Vitamin D, Ergocalciferol, (DRISDOL) 50000 units CAPS capsule Take 50,000 Units by mouth every 7 (seven) days.   . [DISCONTINUED] ALPRAZolam (XANAX) 1 MG tablet Take 1 mg by mouth at bedtime as needed for sleep. Reported on 09/06/2015    No facility-administered encounter medications on file as of 03/13/2017.     ALLERGIES:  Allergies  Allergen Reactions  . Penicillins      PHYSICAL EXAM:  ECOG Performance status: 0-1 - Largely asymptomatic; remains independent   Vitals:   03/13/17 1353  BP: (!) 137/51  Pulse: (!) 16  Resp: 16  Temp: 97.6 F (36.4 C)  SpO2: 100%   Filed Weights   03/13/17 1353  Weight: 111 lb 1.6 oz (50.4 kg)    Physical Exam  Constitutional: She is oriented to person, place, and time and well-developed, well-nourished, and in no distress.  HENT:  Head: Normocephalic.  Mouth/Throat: Oropharynx is clear and moist. No oropharyngeal exudate.  Eyes: Conjunctivae are normal. Pupils are equal, round, and reactive to light. No scleral icterus.  Neck: Normal range of motion. Neck supple.  Cardiovascular:  Bradycardic; irregularly regular rhythm  Pulmonary/Chest: Effort normal and breath sounds normal. No respiratory distress.     Abdominal: Soft. Bowel sounds are normal. There is no tenderness. There is no rebound and no guarding.  Musculoskeletal: Normal range of motion. She exhibits no edema.  Lymphadenopathy:    She has no cervical adenopathy.    She has no axillary adenopathy.       Right: No supraclavicular adenopathy present.       Left: No supraclavicular adenopathy present.  Neurological: She is alert and oriented to person, place, and time. No cranial nerve deficit. Gait normal.  Skin: Skin is warm and dry. No rash noted.  Psychiatric: Mood, memory, affect and judgment normal.  Nursing note and vitals reviewed.    LABORATORY DATA:  I have reviewed the labs as listed.  CBC    Component Value Date/Time   WBC 7.0 03/13/2017 1317   RBC 4.26 03/13/2017 1317   HGB 13.0 03/13/2017 1317   HCT 40.1 03/13/2017 1317   PLT 183 03/13/2017 1317   MCV 94.1 03/13/2017 1317   MCH 30.5 03/13/2017 1317   MCHC 32.4 03/13/2017 1317   RDW 13.3 03/13/2017 1317   LYMPHSABS 1.5 03/13/2017 1317   MONOABS 0.5 03/13/2017 1317   EOSABS 0.1 03/13/2017 1317   BASOSABS 0.0 03/13/2017 1317   CMP Latest Ref Rng & Units 03/13/2017 09/05/2016 03/07/2016  Glucose 65 - 99 mg/dL 144(H) 135(H) 93  BUN 6 - 20 mg/dL 21(H) 18 27(H)  Creatinine 0.44 - 1.00 mg/dL 1.16(H) 1.08(H) 1.33(H)  Sodium 135 - 145 mmol/L 137 137 137  Potassium 3.5 - 5.1 mmol/L 4.2 3.6 3.9  Chloride 101 - 111 mmol/L 101 101 101  CO2 22 - 32 mmol/L _0 Calcium 8.9 - 10.3 mg/dL 9.5 9.1 9.0  Total Protein 6.5 - 8.1 g/dL 7.1 7.0 7.0  Total Bilirubin 0.3 - 1.2 mg/dL 0.5 0.7 0.4  Alkaline Phos 38 - 126 U/L 36(L) 38 34(L)  AST 15 - 41 U/L _1 ALT 14 - 54 U/L 11(L) 11(L) 11(L)    PENDING LABS:  DIAGNOSTIC IMAGING:  *The following radiologic images and reports have been reviewed independently and agree with below findings.  Last mammogram: 11/09/15    Last DEXA scan: 11/15/14   PATHOLOGY:        ASSESSMENT & PLAN:    Stage III  adenocarcinoma of sigmoid colon:  -Diagnosed in 05/2004. Treated with segmental resection, followed by adjuvant chemo with Xeloda/Oxaliplatin x 6 cycles; received Avastin with cycle #1, then Avastin discontinued. She has been in remission since that time.  -Last colonoscopy 08/31/13 with Dr. Gala Romney; recommended repeat exam in 5 years (09/2018).  -CEA serial monitoring has been normal. No need to continue CEA monitoring beyond 5 years per NCCN Guidelines.  -Return to cancer center in 12 months for continued follow-up.   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 Treated with neoadjuvant anti-estrogen therapy with Arimidex x 4 months. Then underwent right lumpectomy, followed by adjuvant radiation therapy. Started on Femara in 05/2005 and continued through 05/2007; then switched to Tamoxifen from 05/2007-08/2009.  -Last mammogram 10/2016 without evidence of recurrence. Repeat mammogram in 10/2017. -Clinical breast exam benign today.  -Return to cancer center in 12 months for continued follow-up.   Bone health:  -Osteoporosis noted on last DEXA scan in 10/2016, which is unchanged compared to previous DEXA from 2016 -Continue Prolia injections every 6 months.  -Continue Biennial DEXA imaging.       Dispo:  -Annual mammogram due in 10/2017.  -Continue prolia q6 months. -RTC for follow up in 1 year.   All questions were answered to patient's stated satisfaction. Encouraged patient to call with any new concerns or questions before her next visit to the cancer center and we can certain see her sooner, if needed.    Twana First

## 2017-09-09 ENCOUNTER — Other Ambulatory Visit (HOSPITAL_COMMUNITY): Payer: Self-pay

## 2017-09-09 DIAGNOSIS — C189 Malignant neoplasm of colon, unspecified: Secondary | ICD-10-CM

## 2017-09-09 DIAGNOSIS — C187 Malignant neoplasm of sigmoid colon: Secondary | ICD-10-CM

## 2017-09-09 DIAGNOSIS — M810 Age-related osteoporosis without current pathological fracture: Secondary | ICD-10-CM

## 2017-09-09 DIAGNOSIS — C50919 Malignant neoplasm of unspecified site of unspecified female breast: Secondary | ICD-10-CM

## 2017-09-11 ENCOUNTER — Inpatient Hospital Stay (HOSPITAL_COMMUNITY): Payer: Medicare Other

## 2017-09-11 ENCOUNTER — Other Ambulatory Visit: Payer: Self-pay

## 2017-09-11 ENCOUNTER — Encounter (HOSPITAL_COMMUNITY): Payer: Self-pay

## 2017-09-11 ENCOUNTER — Inpatient Hospital Stay (HOSPITAL_COMMUNITY): Payer: Medicare Other | Attending: Hematology

## 2017-09-11 VITALS — BP 145/55 | HR 60 | Temp 97.6°F | Resp 16

## 2017-09-11 DIAGNOSIS — C50919 Malignant neoplasm of unspecified site of unspecified female breast: Secondary | ICD-10-CM

## 2017-09-11 DIAGNOSIS — M818 Other osteoporosis without current pathological fracture: Secondary | ICD-10-CM | POA: Diagnosis not present

## 2017-09-11 DIAGNOSIS — Z853 Personal history of malignant neoplasm of breast: Secondary | ICD-10-CM | POA: Insufficient documentation

## 2017-09-11 DIAGNOSIS — C187 Malignant neoplasm of sigmoid colon: Secondary | ICD-10-CM

## 2017-09-11 DIAGNOSIS — M810 Age-related osteoporosis without current pathological fracture: Secondary | ICD-10-CM

## 2017-09-11 DIAGNOSIS — C189 Malignant neoplasm of colon, unspecified: Secondary | ICD-10-CM

## 2017-09-11 LAB — COMPREHENSIVE METABOLIC PANEL
ALBUMIN: 4.5 g/dL (ref 3.5–5.0)
ALT: 12 U/L — AB (ref 14–54)
AST: 19 U/L (ref 15–41)
Alkaline Phosphatase: 40 U/L (ref 38–126)
Anion gap: 11 (ref 5–15)
BILIRUBIN TOTAL: 0.5 mg/dL (ref 0.3–1.2)
BUN: 20 mg/dL (ref 6–20)
CALCIUM: 9.6 mg/dL (ref 8.9–10.3)
CO2: 27 mmol/L (ref 22–32)
Chloride: 100 mmol/L — ABNORMAL LOW (ref 101–111)
Creatinine, Ser: 1.21 mg/dL — ABNORMAL HIGH (ref 0.44–1.00)
GFR calc Af Amer: 49 mL/min — ABNORMAL LOW (ref >60)
GFR, EST NON AFRICAN AMERICAN: 43 mL/min — AB (ref >60)
GLUCOSE: 164 mg/dL — AB (ref 65–99)
Potassium: 4 mmol/L (ref 3.5–5.1)
Sodium: 138 mmol/L (ref 135–145)
TOTAL PROTEIN: 7.2 g/dL (ref 6.5–8.1)

## 2017-09-11 MED ORDER — DENOSUMAB 60 MG/ML ~~LOC~~ SOSY
60.0000 mg | PREFILLED_SYRINGE | Freq: Once | SUBCUTANEOUS | Status: AC
  Administered 2017-09-11: 60 mg via SUBCUTANEOUS
  Filled 2017-09-11: qty 1

## 2017-09-11 MED ORDER — DENOSUMAB 60 MG/ML ~~LOC~~ SOLN: 60.0000 mg | Freq: Once | SUBCUTANEOUS | Status: DC

## 2017-09-11 NOTE — Patient Instructions (Signed)
[facility] at [facility] Discharge Instructions  Received Prolia injection today. Follow-up as scheduled. Call clinic for any questions or concerns   Thank you for choosing [facility] at [facility] to provide your oncology and hematology care.  To afford each patient quality time with our provider, please arrive at least 15 minutes before your scheduled appointment time.   If you have a lab appointment with the Cancer Center please come in thru the  Main Entrance and check in at the main information desk  You need to re-schedule your appointment should you arrive 10 or more minutes late.  We strive to give you quality time with our providers, and arriving late affects you and other patients whose appointments are after yours.  Also, if you no show three or more times for appointments you may be dismissed from the clinic at the providers discretion.     Again, thank you for choosing [facility].  Our hope is that these requests will decrease the amount of time that you wait before being seen by our physicians.       _____________________________________________________________  Should you have questions after your visit to [facility], please contact our office at [phone] between the hours of 8:30 a.m. and 4:30 p.m.  Voicemails left after 4:30 p.m. will not be returned until the following business day.  For prescription refill requests, have your pharmacy contact our office.       Resources For Cancer Patients and their Caregivers ? American Cancer Society: Can assist with transportation, wigs, general needs, runs Look Good Feel Better.        1-888-227-6333 ? Cancer Care: Provides financial assistance, online support groups, medication/co-pay assistance.  1-800-813-HOPE (4673) ? [facility] Assists [place] cancer patients and their families through emotional , educational and  financial support.  336-427-4357 ? [place] DSS Where to apply for food stamps, Medicaid and utility assistance. 336-342-1394 ? [facility]: Transportation to medical appointments. 336-347-2287 ? Social Security Administration: May apply for disability if have a Stage IV cancer. 336-342-7796 1-800-772-1213 ? [place] Aging, Disability and Transit Services: Assists with nutrition, care and transit needs. 336-349-2343  Cancer Center Support Programs:   > Cancer Support Group  2nd Tuesday of the month 1pm-2pm, Journey Room   > Creative Journey  3rd Tuesday of the month 1130am-1pm, Journey Room    

## 2017-09-11 NOTE — Progress Notes (Signed)
Prolia given per orders. See MAR for details.   Patient tolerated it well without problems. Vitals stable and discharged home from clinic ambulatory. Follow up as scheduled.

## 2017-09-24 DIAGNOSIS — F419 Anxiety disorder, unspecified: Secondary | ICD-10-CM | POA: Diagnosis not present

## 2017-09-24 DIAGNOSIS — Z681 Body mass index (BMI) 19 or less, adult: Secondary | ICD-10-CM | POA: Diagnosis not present

## 2017-09-24 DIAGNOSIS — Z1389 Encounter for screening for other disorder: Secondary | ICD-10-CM | POA: Diagnosis not present

## 2017-10-24 ENCOUNTER — Other Ambulatory Visit (HOSPITAL_COMMUNITY): Payer: Self-pay | Admitting: Family Medicine

## 2017-10-24 DIAGNOSIS — Z1231 Encounter for screening mammogram for malignant neoplasm of breast: Secondary | ICD-10-CM

## 2017-11-21 ENCOUNTER — Ambulatory Visit (HOSPITAL_COMMUNITY)
Admission: RE | Admit: 2017-11-21 | Discharge: 2017-11-21 | Disposition: A | Discharge status: Home / Self Care | Payer: Medicare Other | Source: Ambulatory Visit | Attending: Family Medicine | Admitting: Family Medicine

## 2017-11-21 ENCOUNTER — Encounter (HOSPITAL_COMMUNITY): Payer: Self-pay

## 2017-11-21 DIAGNOSIS — Z1231 Encounter for screening mammogram for malignant neoplasm of breast: Secondary | ICD-10-CM

## 2017-11-25 ENCOUNTER — Other Ambulatory Visit (HOSPITAL_COMMUNITY): Payer: Self-pay | Admitting: Family Medicine

## 2017-11-25 DIAGNOSIS — R928 Other abnormal and inconclusive findings on diagnostic imaging of breast: Secondary | ICD-10-CM

## 2017-11-26 ENCOUNTER — Ambulatory Visit (HOSPITAL_COMMUNITY)
Admission: RE | Admit: 2017-11-26 | Discharge: 2017-11-26 | Disposition: A | Discharge status: Home / Self Care | Payer: Medicare Other | Source: Ambulatory Visit | Attending: Family Medicine | Admitting: Family Medicine

## 2017-11-26 DIAGNOSIS — Z853 Personal history of malignant neoplasm of breast: Secondary | ICD-10-CM | POA: Diagnosis not present

## 2017-11-26 DIAGNOSIS — R928 Other abnormal and inconclusive findings on diagnostic imaging of breast: Secondary | ICD-10-CM

## 2017-11-26 DIAGNOSIS — R922 Inconclusive mammogram: Secondary | ICD-10-CM | POA: Diagnosis not present

## 2018-01-20 DIAGNOSIS — Z23 Encounter for immunization: Secondary | ICD-10-CM | POA: Diagnosis not present

## 2018-01-28 DIAGNOSIS — N183 Chronic kidney disease, stage 3 (moderate): Secondary | ICD-10-CM | POA: Diagnosis not present

## 2018-01-28 DIAGNOSIS — Z681 Body mass index (BMI) 19 or less, adult: Secondary | ICD-10-CM | POA: Diagnosis not present

## 2018-01-28 DIAGNOSIS — R946 Abnormal results of thyroid function studies: Secondary | ICD-10-CM | POA: Diagnosis not present

## 2018-01-28 DIAGNOSIS — R7309 Other abnormal glucose: Secondary | ICD-10-CM | POA: Diagnosis not present

## 2018-01-28 DIAGNOSIS — I10 Essential (primary) hypertension: Secondary | ICD-10-CM | POA: Diagnosis not present

## 2018-01-28 DIAGNOSIS — Z23 Encounter for immunization: Secondary | ICD-10-CM | POA: Diagnosis not present

## 2018-01-28 DIAGNOSIS — Z1389 Encounter for screening for other disorder: Secondary | ICD-10-CM | POA: Diagnosis not present

## 2018-01-28 DIAGNOSIS — E785 Hyperlipidemia, unspecified: Secondary | ICD-10-CM | POA: Diagnosis not present

## 2018-01-28 DIAGNOSIS — D473 Essential (hemorrhagic) thrombocythemia: Secondary | ICD-10-CM | POA: Diagnosis not present

## 2018-01-28 DIAGNOSIS — Z0001 Encounter for general adult medical examination with abnormal findings: Secondary | ICD-10-CM | POA: Diagnosis not present

## 2018-02-13 ENCOUNTER — Other Ambulatory Visit (HOSPITAL_COMMUNITY): Payer: Self-pay | Admitting: Family Medicine

## 2018-02-13 DIAGNOSIS — E2839 Other primary ovarian failure: Secondary | ICD-10-CM

## 2018-02-18 ENCOUNTER — Other Ambulatory Visit (HOSPITAL_COMMUNITY): Payer: Self-pay | Admitting: Family Medicine

## 2018-02-18 DIAGNOSIS — E2839 Other primary ovarian failure: Secondary | ICD-10-CM

## 2018-02-25 DIAGNOSIS — Z961 Presence of intraocular lens: Secondary | ICD-10-CM | POA: Diagnosis not present

## 2018-02-25 DIAGNOSIS — H353131 Nonexudative age-related macular degeneration, bilateral, early dry stage: Secondary | ICD-10-CM | POA: Diagnosis not present

## 2018-03-11 ENCOUNTER — Other Ambulatory Visit (HOSPITAL_COMMUNITY): Payer: Self-pay

## 2018-03-11 DIAGNOSIS — C189 Malignant neoplasm of colon, unspecified: Secondary | ICD-10-CM

## 2018-03-11 DIAGNOSIS — C50919 Malignant neoplasm of unspecified site of unspecified female breast: Secondary | ICD-10-CM

## 2018-03-11 DIAGNOSIS — C187 Malignant neoplasm of sigmoid colon: Secondary | ICD-10-CM

## 2018-03-11 DIAGNOSIS — M810 Age-related osteoporosis without current pathological fracture: Secondary | ICD-10-CM

## 2018-03-12 ENCOUNTER — Inpatient Hospital Stay (HOSPITAL_COMMUNITY): Admission: RE | Admit: 2018-03-12 | Payer: Self-pay | Source: Ambulatory Visit

## 2018-03-13 ENCOUNTER — Ambulatory Visit (HOSPITAL_COMMUNITY): Payer: Medicare Other

## 2018-03-13 ENCOUNTER — Other Ambulatory Visit (HOSPITAL_COMMUNITY): Payer: Medicare Other

## 2018-03-13 ENCOUNTER — Ambulatory Visit (HOSPITAL_COMMUNITY): Payer: Medicare Other | Admitting: Internal Medicine

## 2018-03-17 ENCOUNTER — Inpatient Hospital Stay (HOSPITAL_BASED_OUTPATIENT_CLINIC_OR_DEPARTMENT_OTHER): Payer: Medicare Other | Admitting: Internal Medicine

## 2018-03-17 ENCOUNTER — Inpatient Hospital Stay (HOSPITAL_COMMUNITY): Payer: Medicare Other | Attending: Hematology

## 2018-03-17 ENCOUNTER — Encounter (HOSPITAL_COMMUNITY): Payer: Self-pay | Admitting: Internal Medicine

## 2018-03-17 ENCOUNTER — Inpatient Hospital Stay (HOSPITAL_COMMUNITY): Payer: Medicare Other

## 2018-03-17 ENCOUNTER — Other Ambulatory Visit: Payer: Self-pay

## 2018-03-17 VITALS — BP 148/57 | HR 66 | Temp 98.0°F | Resp 16 | Wt 113.3 lb

## 2018-03-17 DIAGNOSIS — Z853 Personal history of malignant neoplasm of breast: Secondary | ICD-10-CM | POA: Insufficient documentation

## 2018-03-17 DIAGNOSIS — C187 Malignant neoplasm of sigmoid colon: Secondary | ICD-10-CM

## 2018-03-17 DIAGNOSIS — Z85038 Personal history of other malignant neoplasm of large intestine: Secondary | ICD-10-CM | POA: Insufficient documentation

## 2018-03-17 DIAGNOSIS — Z9221 Personal history of antineoplastic chemotherapy: Secondary | ICD-10-CM | POA: Insufficient documentation

## 2018-03-17 DIAGNOSIS — C50919 Malignant neoplasm of unspecified site of unspecified female breast: Secondary | ICD-10-CM

## 2018-03-17 DIAGNOSIS — Z923 Personal history of irradiation: Secondary | ICD-10-CM | POA: Diagnosis not present

## 2018-03-17 DIAGNOSIS — M810 Age-related osteoporosis without current pathological fracture: Secondary | ICD-10-CM | POA: Diagnosis not present

## 2018-03-17 DIAGNOSIS — C189 Malignant neoplasm of colon, unspecified: Secondary | ICD-10-CM

## 2018-03-17 DIAGNOSIS — C50911 Malignant neoplasm of unspecified site of right female breast: Secondary | ICD-10-CM

## 2018-03-17 LAB — CBC WITH DIFFERENTIAL/PLATELET
Abs Immature Granulocytes: 0.03 10*3/uL (ref 0.00–0.07)
Basophils Absolute: 0.1 10*3/uL (ref 0.0–0.1)
Basophils Relative: 1 %
EOS PCT: 1 %
Eosinophils Absolute: 0.1 10*3/uL (ref 0.0–0.5)
HCT: 36.7 % (ref 36.0–46.0)
HEMOGLOBIN: 11.9 g/dL — AB (ref 12.0–15.0)
Immature Granulocytes: 1 %
LYMPHS PCT: 19 %
Lymphs Abs: 1.2 10*3/uL (ref 0.7–4.0)
MCH: 30 pg (ref 26.0–34.0)
MCHC: 32.4 g/dL (ref 30.0–36.0)
MCV: 92.4 fL (ref 80.0–100.0)
MONO ABS: 0.6 10*3/uL (ref 0.1–1.0)
Monocytes Relative: 9 %
Neutro Abs: 4.6 10*3/uL (ref 1.7–7.7)
Neutrophils Relative %: 69 %
Platelets: 160 10*3/uL (ref 150–400)
RBC: 3.97 MIL/uL (ref 3.87–5.11)
RDW: 13 % (ref 11.5–15.5)
WBC: 6.6 10*3/uL (ref 4.0–10.5)
nRBC: 0 % (ref 0.0–0.2)

## 2018-03-17 LAB — COMPREHENSIVE METABOLIC PANEL
ALK PHOS: 37 U/L — AB (ref 38–126)
ALT: 11 U/L (ref 0–44)
ANION GAP: 8 (ref 5–15)
AST: 18 U/L (ref 15–41)
Albumin: 4.3 g/dL (ref 3.5–5.0)
BILIRUBIN TOTAL: 0.4 mg/dL (ref 0.3–1.2)
BUN: 19 mg/dL (ref 8–23)
CALCIUM: 9.1 mg/dL (ref 8.9–10.3)
CO2: 26 mmol/L (ref 22–32)
Chloride: 103 mmol/L (ref 98–111)
Creatinine, Ser: 1.21 mg/dL — ABNORMAL HIGH (ref 0.44–1.00)
GFR, EST AFRICAN AMERICAN: 50 mL/min — AB (ref >60)
GFR, EST NON AFRICAN AMERICAN: 43 mL/min — AB (ref >60)
Glucose, Bld: 96 mg/dL (ref 70–99)
Potassium: 4.1 mmol/L (ref 3.5–5.1)
SODIUM: 137 mmol/L (ref 135–145)
TOTAL PROTEIN: 6.7 g/dL (ref 6.5–8.1)

## 2018-03-17 MED ORDER — DENOSUMAB 60 MG/ML ~~LOC~~ SOSY
60.0000 mg | PREFILLED_SYRINGE | Freq: Once | SUBCUTANEOUS | Status: AC
  Administered 2018-03-17: 60 mg via SUBCUTANEOUS
  Filled 2018-03-17: qty 1

## 2018-03-17 NOTE — Progress Notes (Signed)
Diagnosis Osteoporosis, senile - Plan: CBC with Differential/Platelet, Comprehensive metabolic panel, Lactate dehydrogenase, DG Bone Density, MM 3D SCREEN BREAST BILATERAL  Adenocarcinoma of sigmoid colon (HCC)  Invasive ductal carcinoma of right breast ([place]) - Plan: CBC with Differential/Platelet, Comprehensive metabolic panel, Lactate dehydrogenase, DG Bone Density, MM 3D SCREEN BREAST BILATERAL  Staging Cancer Staging Adenocarcinoma of sigmoid colon (HCC) Staging form: Colon and Rectum, AJCC 7th Edition - Clinical: No stage assigned - Unsigned - Pathologic: No stage assigned - Unsigned  Invasive ductal carcinoma of right breast (HCC) Staging form: Breast, AJCC 7th Edition - Clinical: Stage IA (T1c, N0, cM0) - Signed by Baird Cancer, PA-C on 01/11/2013   Assessment and Plan:  1.  Stage III adenocarcinoma of sigmoid colon:  -Diagnosed in 05/2004. Treated with segmental resection, followed by adjuvant chemo with Xeloda/Oxaliplatin x 6 cycles; received Avastin with cycle #1, then Avastin discontinued. She has been in remission since that time.  -Last colonoscopy 08/31/13 with Dr. Gala Romney; recommended repeat exam in 5 years (09/2018).  -Last CEA 1.8 in 03/2016.   No need to continue CEA monitoring beyond 5 years per NCCN Guidelines.   Labs done 03/17/2018 reviewed and showed WBC 6.6 HB 11.9 plts 160,000.  Chemistries WNL with K+ 4.1 Cr 1.21 and normal LFTs.  Pt will have repeat labs in 11/2018 and will follow-up at that time to go over results.    2.  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 Treated with neoadjuvant anti-estrogen therapy with Arimidex x 4 months. Then underwent right lumpectomy, followed by adjuvant radiation therapy. Started on Femara in 05/2005 and continued through 05/2007; then switched to Tamoxifen from 05/2007-08/2009.  -Diagnostic mammogram done 11/26/2017 negative.  Pt recommended for screening mammogram in 11/2018.  Pt set up for 3  D screening mammogram in 11/2018 and will follow-up to go over results.   3.  Osteoporosis.  This was noted on  noted on last DEXA scan in 10/2016.  Pt set up for Dexa in 11/2018.  She should continue Prolia injections every 6 months.  Pt has also been recommended for calcium and vitamin D.    4  Teeth concerns.  I discussed with her to provide clinic dentist name to discuss if they have concerns for Osteonecrosis.  She has at times been sporadic with Prolia but if diagnosed with ON clinic is willing to discontinue therapy.    25 minutes spent with more than 50% spent in counseling and coordination of care.    Current Status:  Pt is seen for follow-up prior to Prolia.      Invasive ductal carcinoma of right breast ([place])   07/19/2004 Imaging    PET- increased uptake in posterior aspect of right breast    08/09/2004 Imaging    Mammogram and [place]    08/09/2004 Initial Diagnosis    Invasive ductal carcinoma of right breast on needle core biopsy of right breast    08/22/2004 Imaging    MRI B/L Breast- 1.8 x 1.4 x 1.3 cm right breast mass    08/29/2004 - 12/13/2004 Chemotherapy    Arimidex x 4 months neoadjuvantly- 77% shrinkage by volume size    12/20/2004 Surgery    Right partial mastectomy- 2 cm, ER 89%, PR 10%, Her2 negative, Ki-67 16%.      02/06/2005 - 03/28/2005 Radiation Therapy    By Dr. Elba Barman    05/15/2005 - 05/06/2007 Chemotherapy    Femara    08/29/2005 Imaging  Mammogram s/p chemo and resection.  BIRADS 2    05/06/2007 - 09/06/2009 Chemotherapy    Tamoxifen     Adenocarcinoma of sigmoid colon ([place])   05/29/2004 Initial Diagnosis    Adenocarcinoma of sigmoid colon    05/29/2004 Surgery    Segmental resection, 6 cm maximum size, adenocarcinoma, 6/15 lymph nodes positive for disease, T3N2    07/25/2004 - 11/15/2004 Chemotherapy    Xeloda + Oxaliplatin, Avastin x 6 cycles.  Avastin d/c after cycle 1     Remission         Problem List Patient Active Problem List   Diagnosis Date  Noted  . Vitamin D deficiency [E55.9] 11/27/2015  . Peripheral neuropathy [G62.9] 01/17/2012  . Spinal stenosis, lumbar [M48.061] 05/22/2011  . Left leg pain [M79.605] 03/23/2011  . Arthritis of back [M47.9] 03/21/2011  . Osteoporosis, senile [M81.0] 03/05/2011  . Invasive ductal carcinoma of right breast ([place]) [C50.911] 11/14/2010  . Adenocarcinoma of sigmoid colon ([place]) [C18.7] 11/14/2010    Past Medical History Past Medical History:  Diagnosis Date  . Adenocarcinoma of sigmoid colon ([place]) 11/14/2010  . Arthritis   . Breast CA ([place]) 11/14/2010  . Colon cancer ([place]) 11/14/2010  . Depression   . Falls   . H/O: hysterectomy   . History of right knee surgery    for bone cancer right knee  . Hypercholesteremia   . Hypertension   . Invasive ductal carcinoma of right breast ([place]) 11/14/2010  . Mitral valve prolapse   . Osteoporosis   . Osteoporosis, senile 03/05/2011  . Peripheral neuropathy   . 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 S/P cataract surgery    bil eyes    Past Surgical History Past Surgical History:  Procedure Laterality Date  . ABDOMINAL HYSTERECTOMY    . CATARACT EXTRACTION, BILATERAL    . COLONOSCOPY  08/27/2008   Dr. Madolyn Frieze papilla.  Surgical anastomosis at 8-10 cm from the anal verge.  Residual rectal and colonic mucosa appeared normal.  . COLONOSCOPY N/A 08/31/2013   Procedure: COLONOSCOPY;  Surgeon: Daneil Dolin, MD;  Location: [facility];  Service: Endoscopy;  Laterality: N/A;  2:45  . Epidural steroid injection with Kenalog     Midline  . KNEE SURGERY    . PORT-A-CATH REMOVAL  02/04/2012   Procedure: REMOVAL PORT-A-CATH;  Surgeon: Donato Heinz, MD;  Location: [facility] ORS;  Service: General;  Laterality: N/A;  minor room  . retinal hole repair     bilateral  . TONSILLECTOMY    . YAG LASER APPLICATION Left 4/65/0354   Procedure: YAG LASER APPLICATION;  Surgeon: Elta Guadeloupe T. Gershon Crane, MD;  Location: [facility] OR[facility];  Service:  Ophthalmology;  Laterality: Left;    Family History Family History  Problem Relation Age of Onset  . Stroke Mother   . Cancer Mother        breast  cancer  . Heart attack Father   . Colon cancer Neg Hx      Social History  reports that she has never smoked. She has never used smokeless tobacco. She reports that she does not drink alcohol or use drugs.  Medications  Current Outpatient Medications:  .  acetaminophen (TYLENOL) 325 MG tablet, Take 650 mg by mouth as needed for mild pain. , Disp: , Rfl:  .  denosumab (PROLIA) 60 MG/ML SOLN injection, Inject 60 mg into the skin every 6 (six) months. Administer in upper arm, thigh, or abdomen, Disp: ,  Rfl:  .  gabapentin (NEURONTIN) 300 MG capsule, Take 1 cap at breakfast. Take 2 cap at lunch. Take 2 cap at dinner. Take 1 cap at bedtime., Disp: 180 capsule, Rfl: 6 .  lisinopril-hydrochlorothiazide (PRINZIDE,ZESTORETIC) 20-12.5 MG per tablet, Take 1 tablet by mouth daily. Reported on 09/06/2015, Disp: , Rfl:  .  LORazepam (ATIVAN) 1 MG tablet, Take 1 mg by mouth every 8 (eight) hours., Disp: , Rfl:   Allergies Penicillins  Review of Systems Review of Systems - Oncology ROS negative other than teeth concerns.     Physical Exam  Vitals Wt Readings from Last 3 Encounters:  03/17/18 113 lb 4.8 oz (51.4 kg)  03/13/17 111 lb 1.6 oz (50.4 kg)  09/05/16 109 lb (49.4 kg)   Temp Readings from Last 3 Encounters:  03/17/18 98 F (36.7 C) (Oral)  09/11/17 97.6 F (36.4 C) (Oral)  03/13/17 97.6 F (36.4 C) (Oral)   BP Readings from Last 3 Encounters:  03/17/18 (!) 148/57  09/11/17 (!) 145/55  03/13/17 (!) 137/51   Pulse Readings from Last 3 Encounters:  03/17/18 66  09/11/17 60  03/13/17 (!) 16   Constitutional: Well-developed, well-nourished, and in no distress.   HENT: Head: Normocephalic and atraumatic.  Mouth/Throat: No oropharyngeal exudate. Mucosa moist. Eyes: Pupils are equal, round, and reactive to light. Conjunctivae  are normal. No scleral icterus.  Neck: Normal range of motion. Neck supple. No JVD present.  Cardiovascular: Normal rate, regular rhythm and normal heart sounds.  Exam reveals no gallop and no friction rub.   No murmur heard. Pulmonary/Chest: Effort normal and breath sounds normal. No respiratory distress. No wheezes.No rales.  Abdominal: Soft. Bowel sounds are normal. No distension. There is no tenderness. There is no guarding.  Musculoskeletal: No edema or tenderness.  Lymphadenopathy: No cervical, axillary or supraclavicular adenopathy.  Neurological: Alert and oriented to person, place, and time. No cranial nerve deficit.  Skin: Skin is warm and dry. No rash noted. No erythema. No pallor.  Psychiatric: Affect and judgment normal.  Breast exam:  Chaperone present.  No dominant masses palpable bilaterally.    Labs Appointment on 03/17/2018  Component Date Value Ref Range Status  . WBC 03/17/2018 6.6  4.0 - 10.5 K/uL Final  . RBC 03/17/2018 3.97  3.87 - 5.11 MIL/uL Final  . Hemoglobin 03/17/2018 11.9* 12.0 - 15.0 g/dL Final  . HCT 03/17/2018 36.7  36.0 - 46.0 % Final  . MCV 03/17/2018 92.4  80.0 - 100.0 fL Final  . MCH 03/17/2018 30.0  26.0 - 34.0 pg Final  . MCHC 03/17/2018 32.4  30.0 - 36.0 g/dL Final  . RDW 03/17/2018 13.0  11.5 - 15.5 % Final  . Platelets 03/17/2018 160  150 - 400 K/uL Final  . nRBC 03/17/2018 0.0  0.0 - 0.2 % Final  . Neutrophils Relative % 03/17/2018 69  % Final  . Neutro Abs 03/17/2018 4.6  1.7 - 7.7 K/uL Final  . Lymphocytes Relative 03/17/2018 19  % Final  . Lymphs Abs 03/17/2018 1.2  0.7 - 4.0 K/uL Final  . Monocytes Relative 03/17/2018 9  % Final  . Monocytes Absolute 03/17/2018 0.6  0.1 - 1.0 K/uL Final  . Eosinophils Relative 03/17/2018 1  % Final  . Eosinophils Absolute 03/17/2018 0.1  0.0 - 0.5 K/uL Final  . Basophils Relative 03/17/2018 1  % Final  . Basophils Absolute 03/17/2018 0.1  0.0 - 0.1 K/uL Final  . Immature Granulocytes 03/17/2018 1  %  Final  .  Abs Immature Granulocytes 03/17/2018 0.03  0.00 - 0.07 K/uL Final   Performed at [facility], [address], [place], [place] [address]  . Sodium 03/17/2018 137  135 - 145 mmol/L Final  . Potassium 03/17/2018 4.1  3.5 - 5.1 mmol/L Final  . Chloride 03/17/2018 103  98 - 111 mmol/L Final  . CO2 03/17/2018 26  22 - 32 mmol/L Final  . Glucose, Bld 03/17/2018 96  70 - 99 mg/dL Final  . BUN 03/17/2018 19  8 - 23 mg/dL Final  . Creatinine, Ser 03/17/2018 1.21* 0.44 - 1.00 mg/dL Final  . Calcium 03/17/2018 9.1  8.9 - 10.3 mg/dL Final  . Total Protein 03/17/2018 6.7  6.5 - 8.1 g/dL Final  . Albumin 03/17/2018 4.3  3.5 - 5.0 g/dL Final  . AST 03/17/2018 18  15 - 41 U/L Final  . ALT 03/17/2018 11  0 - 44 U/L Final  . Alkaline Phosphatase 03/17/2018 37* 38 - 126 U/L Final  . Total Bilirubin 03/17/2018 0.4  0.3 - 1.2 mg/dL Final  . GFR calc non Af Amer 03/17/2018 43* >60 mL/min Final  . GFR calc Af Amer 03/17/2018 50* >60 mL/min Final  . Anion gap 03/17/2018 8  5 - 15 Final   Performed at [facility], [address] [place], [place] [address]     Pathology Orders Placed This Encounter  Procedures  . DG Bone Density    Standing Status:   Future    Standing Expiration Date:   03/17/2019    Order Specific Question:   Reason for Exam (SYMPTOM  OR DIAGNOSIS REQUIRED)    Answer:   postmenopausal osteoporosis    Order Specific Question:   Preferred imaging location?    Answer:   [place] BREAST BILATERAL    Standing Status:   Future    Standing Expiration Date:   05/19/2019    Order Specific Question:   Reason for Exam (SYMPTOM  OR DIAGNOSIS REQUIRED)    Answer:   right breast cancer    Order Specific Question:   Preferred imaging location?    Answer:   [facility]  . CBC with Differential/Platelet    Standing Status:   Future    Standing Expiration Date:   03/17/2020  . Comprehensive metabolic panel    Standing Status:   Future    Standing  Expiration Date:   03/17/2020  . Lactate dehydrogenase    Standing Status:   Future    Standing Expiration Date:   03/17/2020       Zoila Shutter MD

## 2018-03-17 NOTE — Patient Instructions (Signed)
[facility]  Discharge Instructions: You saw Dr. Higgs today                               _______________________________________________________________  Thank you for choosing [facility] at [facility] to provide your oncology and hematology care.  To afford each patient quality time with our providers, please arrive at least 15 minutes before your scheduled appointment.  You need to re-schedule your appointment if you arrive 10 or more minutes late.  We strive to give you quality time with our providers, and arriving late affects you and other patients whose appointments are after yours.  Also, if you no show three or more times for appointments you may be dismissed from the clinic.  Again, thank you for choosing [facility] at [facility]. Our hope is that these requests will allow you access to exceptional care and in a timely manner. _______________________________________________________________  If you have questions after your visit, please contact our office at [phone] between the hours of 8:30 a.m. and 5:00 p.m. Voicemails left after 4:30 p.m. will not be returned until the following business day. _______________________________________________________________  For prescription refill requests, have your pharmacy contact our office. _______________________________________________________________  Recommendations made by the consultant and any test results will be sent to your referring physician. _______________________________________________________________ 

## 2018-03-17 NOTE — Patient Instructions (Signed)
[facility]  Discharge Instructions:  Prolia  _______________________________________________________________  Thank you for choosing [facility] to provide your oncology and hematology care.  To afford each patient quality time with our providers, please arrive at least 15 minutes before your scheduled appointment.  You need to re-schedule your appointment if you arrive 10 or more minutes late.  We strive to give you quality time with our providers, and arriving late affects you and other patients whose appointments are after yours.  Also, if you no show three or more times for appointments you may be dismissed from the clinic.  Again, thank you for choosing [facility]. Our hope is that these requests will allow you access to exceptional care and in a timely manner. _______________________________________________________________  If you have questions after your visit, please contact our office at [phone] between the hours of 8:30 a.m. and 5:00 p.m. Voicemails left after 4:30 p.m. will not be returned until the following business day. _______________________________________________________________  For prescription refill requests, have your pharmacy contact our office. _______________________________________________________________  Recommendations made by the consultant and any test results will be sent to your referring physician. _______________________________________________________________ 

## 2018-03-17 NOTE — Progress Notes (Signed)
Patricia Kline presents today for injection per MD orders. Prolia administered SQ in left Upper Arm. Administration without incident. Patient tolerated well.  Vital signs stable. No complaints at this time. Discharged from clinic ambulatory. F/U with [facility] as scheduled. Appt schedule given to pt. Understanding verbalized.

## 2018-04-02 HISTORY — PX: BREAST BIOPSY: SHX20

## 2018-05-21 DIAGNOSIS — F419 Anxiety disorder, unspecified: Secondary | ICD-10-CM | POA: Diagnosis not present

## 2018-05-21 DIAGNOSIS — Z6820 Body mass index (BMI) 20.0-20.9, adult: Secondary | ICD-10-CM | POA: Diagnosis not present

## 2018-05-21 DIAGNOSIS — Z1389 Encounter for screening for other disorder: Secondary | ICD-10-CM | POA: Diagnosis not present

## 2018-05-21 DIAGNOSIS — H6523 Chronic serous otitis media, bilateral: Secondary | ICD-10-CM | POA: Diagnosis not present

## 2018-05-27 DIAGNOSIS — H9319 Tinnitus, unspecified ear: Secondary | ICD-10-CM | POA: Diagnosis not present

## 2018-05-27 DIAGNOSIS — H6992 Unspecified Eustachian tube disorder, left ear: Secondary | ICD-10-CM | POA: Diagnosis not present

## 2018-05-27 DIAGNOSIS — Z6820 Body mass index (BMI) 20.0-20.9, adult: Secondary | ICD-10-CM | POA: Diagnosis not present

## 2018-09-17 ENCOUNTER — Ambulatory Visit (HOSPITAL_COMMUNITY): Payer: Medicare Other

## 2018-09-17 ENCOUNTER — Other Ambulatory Visit (HOSPITAL_COMMUNITY): Payer: Medicare Other

## 2018-09-19 ENCOUNTER — Other Ambulatory Visit (HOSPITAL_COMMUNITY): Payer: Self-pay | Admitting: *Deleted

## 2018-09-19 DIAGNOSIS — C50911 Malignant neoplasm of unspecified site of right female breast: Secondary | ICD-10-CM

## 2018-09-19 DIAGNOSIS — M810 Age-related osteoporosis without current pathological fracture: Secondary | ICD-10-CM

## 2018-09-19 DIAGNOSIS — C187 Malignant neoplasm of sigmoid colon: Secondary | ICD-10-CM

## 2018-09-22 ENCOUNTER — Encounter (HOSPITAL_COMMUNITY): Payer: Self-pay

## 2018-09-22 ENCOUNTER — Inpatient Hospital Stay (HOSPITAL_COMMUNITY): Payer: Medicare Other | Attending: Hematology

## 2018-09-22 ENCOUNTER — Inpatient Hospital Stay (HOSPITAL_COMMUNITY): Payer: Medicare Other

## 2018-09-22 ENCOUNTER — Other Ambulatory Visit: Payer: Self-pay

## 2018-09-22 VITALS — BP 138/53 | HR 60 | Temp 98.1°F | Resp 16

## 2018-09-22 DIAGNOSIS — M810 Age-related osteoporosis without current pathological fracture: Secondary | ICD-10-CM | POA: Insufficient documentation

## 2018-09-22 DIAGNOSIS — C50911 Malignant neoplasm of unspecified site of right female breast: Secondary | ICD-10-CM

## 2018-09-22 DIAGNOSIS — C187 Malignant neoplasm of sigmoid colon: Secondary | ICD-10-CM

## 2018-09-22 LAB — COMPREHENSIVE METABOLIC PANEL
ALT: 13 U/L (ref 0–44)
AST: 16 U/L (ref 15–41)
Albumin: 4.4 g/dL (ref 3.5–5.0)
Alkaline Phosphatase: 47 U/L (ref 38–126)
Anion gap: 10 (ref 5–15)
BUN: 17 mg/dL (ref 8–23)
CO2: 27 mmol/L (ref 22–32)
Calcium: 9 mg/dL (ref 8.9–10.3)
Chloride: 101 mmol/L (ref 98–111)
Creatinine, Ser: 1.26 mg/dL — ABNORMAL HIGH (ref 0.44–1.00)
GFR calc Af Amer: 48 mL/min — ABNORMAL LOW (ref >60)
GFR calc non Af Amer: 41 mL/min — ABNORMAL LOW (ref >60)
Glucose, Bld: 104 mg/dL — ABNORMAL HIGH (ref 70–99)
Potassium: 4.1 mmol/L (ref 3.5–5.1)
Sodium: 138 mmol/L (ref 135–145)
Total Bilirubin: 0.6 mg/dL (ref 0.3–1.2)
Total Protein: 6.9 g/dL (ref 6.5–8.1)

## 2018-09-22 MED ORDER — DENOSUMAB 60 MG/ML ~~LOC~~ SOSY
60.0000 mg | PREFILLED_SYRINGE | Freq: Once | SUBCUTANEOUS | Status: AC
  Administered 2018-09-22: 60 mg via SUBCUTANEOUS

## 2018-09-22 NOTE — Progress Notes (Signed)
Patricia Kline presents today for injection per the provider's orders.  Prolia administration without incident; see MAR for injection details.  Patient tolerated procedure well and without incident.  No questions or complaints noted at this time. Pt d/c ambulatory.

## 2018-11-05 DIAGNOSIS — F419 Anxiety disorder, unspecified: Secondary | ICD-10-CM | POA: Diagnosis not present

## 2018-11-05 DIAGNOSIS — Z6820 Body mass index (BMI) 20.0-20.9, adult: Secondary | ICD-10-CM | POA: Diagnosis not present

## 2018-11-05 DIAGNOSIS — R42 Dizziness and giddiness: Secondary | ICD-10-CM | POA: Diagnosis not present

## 2018-12-02 ENCOUNTER — Other Ambulatory Visit (HOSPITAL_COMMUNITY): Payer: Self-pay | Admitting: Emergency Medicine

## 2018-12-02 DIAGNOSIS — C187 Malignant neoplasm of sigmoid colon: Secondary | ICD-10-CM

## 2018-12-03 ENCOUNTER — Ambulatory Visit (HOSPITAL_COMMUNITY)
Admission: RE | Admit: 2018-12-03 | Discharge: 2018-12-03 | Disposition: A | Discharge status: Home / Self Care | Payer: Medicare Other | Source: Ambulatory Visit | Attending: Internal Medicine | Admitting: Internal Medicine

## 2018-12-03 ENCOUNTER — Other Ambulatory Visit: Payer: Self-pay

## 2018-12-03 ENCOUNTER — Inpatient Hospital Stay (HOSPITAL_COMMUNITY): Payer: Medicare Other | Attending: Hematology

## 2018-12-03 DIAGNOSIS — M85852 Other specified disorders of bone density and structure, left thigh: Secondary | ICD-10-CM | POA: Diagnosis not present

## 2018-12-03 DIAGNOSIS — M818 Other osteoporosis without current pathological fracture: Secondary | ICD-10-CM | POA: Insufficient documentation

## 2018-12-03 DIAGNOSIS — M810 Age-related osteoporosis without current pathological fracture: Secondary | ICD-10-CM

## 2018-12-03 DIAGNOSIS — Z9221 Personal history of antineoplastic chemotherapy: Secondary | ICD-10-CM | POA: Diagnosis not present

## 2018-12-03 DIAGNOSIS — R921 Mammographic calcification found on diagnostic imaging of breast: Secondary | ICD-10-CM | POA: Diagnosis not present

## 2018-12-03 DIAGNOSIS — Z780 Asymptomatic menopausal state: Secondary | ICD-10-CM | POA: Diagnosis not present

## 2018-12-03 DIAGNOSIS — Z1231 Encounter for screening mammogram for malignant neoplasm of breast: Secondary | ICD-10-CM | POA: Diagnosis not present

## 2018-12-03 DIAGNOSIS — C50911 Malignant neoplasm of unspecified site of right female breast: Secondary | ICD-10-CM

## 2018-12-03 DIAGNOSIS — C187 Malignant neoplasm of sigmoid colon: Secondary | ICD-10-CM

## 2018-12-03 DIAGNOSIS — Z79899 Other long term (current) drug therapy: Secondary | ICD-10-CM | POA: Diagnosis not present

## 2018-12-03 DIAGNOSIS — Z853 Personal history of malignant neoplasm of breast: Secondary | ICD-10-CM | POA: Insufficient documentation

## 2018-12-03 LAB — CBC WITH DIFFERENTIAL/PLATELET
Abs Immature Granulocytes: 0.06 10*3/uL (ref 0.00–0.07)
Basophils Absolute: 0.1 10*3/uL (ref 0.0–0.1)
Basophils Relative: 1 %
Eosinophils Absolute: 0.1 10*3/uL (ref 0.0–0.5)
Eosinophils Relative: 1 %
HCT: 41.5 % (ref 36.0–46.0)
Hemoglobin: 13.6 g/dL (ref 12.0–15.0)
Immature Granulocytes: 1 %
Lymphocytes Relative: 17 %
Lymphs Abs: 1.4 10*3/uL (ref 0.7–4.0)
MCH: 30.8 pg (ref 26.0–34.0)
MCHC: 32.8 g/dL (ref 30.0–36.0)
MCV: 94.1 fL (ref 80.0–100.0)
Monocytes Absolute: 0.8 10*3/uL (ref 0.1–1.0)
Monocytes Relative: 10 %
Neutro Abs: 5.6 10*3/uL (ref 1.7–7.7)
Neutrophils Relative %: 70 %
Platelets: 194 10*3/uL (ref 150–400)
RBC: 4.41 MIL/uL (ref 3.87–5.11)
RDW: 13.2 % (ref 11.5–15.5)
WBC: 7.9 10*3/uL (ref 4.0–10.5)
nRBC: 0 % (ref 0.0–0.2)

## 2018-12-03 LAB — LACTATE DEHYDROGENASE: LDH: 146 U/L (ref 98–192)

## 2018-12-03 LAB — COMPREHENSIVE METABOLIC PANEL
ALT: 13 U/L (ref 0–44)
AST: 17 U/L (ref 15–41)
Albumin: 4.5 g/dL (ref 3.5–5.0)
Alkaline Phosphatase: 42 U/L (ref 38–126)
Anion gap: 8 (ref 5–15)
BUN: 22 mg/dL (ref 8–23)
CO2: 27 mmol/L (ref 22–32)
Calcium: 9.3 mg/dL (ref 8.9–10.3)
Chloride: 102 mmol/L (ref 98–111)
Creatinine, Ser: 1.21 mg/dL — ABNORMAL HIGH (ref 0.44–1.00)
GFR calc Af Amer: 50 mL/min — ABNORMAL LOW (ref >60)
GFR calc non Af Amer: 43 mL/min — ABNORMAL LOW (ref >60)
Glucose, Bld: 78 mg/dL (ref 70–99)
Potassium: 4.5 mmol/L (ref 3.5–5.1)
Sodium: 137 mmol/L (ref 135–145)
Total Bilirubin: 0.6 mg/dL (ref 0.3–1.2)
Total Protein: 7.3 g/dL (ref 6.5–8.1)

## 2018-12-04 ENCOUNTER — Other Ambulatory Visit (HOSPITAL_COMMUNITY): Payer: Self-pay | Admitting: Internal Medicine

## 2018-12-04 DIAGNOSIS — R928 Other abnormal and inconclusive findings on diagnostic imaging of breast: Secondary | ICD-10-CM

## 2018-12-04 DIAGNOSIS — R921 Mammographic calcification found on diagnostic imaging of breast: Secondary | ICD-10-CM

## 2018-12-05 ENCOUNTER — Ambulatory Visit (HOSPITAL_COMMUNITY): Payer: Medicare Other | Admitting: Nurse Practitioner

## 2018-12-05 ENCOUNTER — Inpatient Hospital Stay (HOSPITAL_BASED_OUTPATIENT_CLINIC_OR_DEPARTMENT_OTHER): Payer: Medicare Other | Admitting: Nurse Practitioner

## 2018-12-05 ENCOUNTER — Other Ambulatory Visit: Payer: Self-pay

## 2018-12-05 ENCOUNTER — Other Ambulatory Visit (HOSPITAL_COMMUNITY): Payer: Self-pay | Admitting: Nurse Practitioner

## 2018-12-05 ENCOUNTER — Encounter (HOSPITAL_COMMUNITY): Payer: Self-pay | Admitting: Nurse Practitioner

## 2018-12-05 DIAGNOSIS — Z79899 Other long term (current) drug therapy: Secondary | ICD-10-CM | POA: Diagnosis not present

## 2018-12-05 DIAGNOSIS — C187 Malignant neoplasm of sigmoid colon: Secondary | ICD-10-CM

## 2018-12-05 DIAGNOSIS — R928 Other abnormal and inconclusive findings on diagnostic imaging of breast: Secondary | ICD-10-CM

## 2018-12-05 DIAGNOSIS — Z9221 Personal history of antineoplastic chemotherapy: Secondary | ICD-10-CM | POA: Diagnosis not present

## 2018-12-05 DIAGNOSIS — Z853 Personal history of malignant neoplasm of breast: Secondary | ICD-10-CM | POA: Diagnosis not present

## 2018-12-05 DIAGNOSIS — M818 Other osteoporosis without current pathological fracture: Secondary | ICD-10-CM | POA: Diagnosis not present

## 2018-12-05 DIAGNOSIS — R921 Mammographic calcification found on diagnostic imaging of breast: Secondary | ICD-10-CM

## 2018-12-05 NOTE — Patient Instructions (Signed)
[facility] at [facility] Discharge Instructions  Follow up in 1 year with labs and mammogram    Thank you for choosing [facility] at [facility] to provide your oncology and hematology care.  To afford each patient quality time with our provider, please arrive at least 15 minutes before your scheduled appointment time.   If you have a lab appointment with the Cancer Center please come in thru the Main Entrance and check in at the main information desk.  You need to re-schedule your appointment should you arrive 10 or more minutes late.  We strive to give you quality time with our providers, and arriving late affects you and other patients whose appointments are after yours.  Also, if you no show three or more times for appointments you may be dismissed from the clinic at the providers discretion.     Again, thank you for choosing [facility].  Our hope is that these requests will decrease the amount of time that you wait before being seen by our physicians.       _____________________________________________________________  Should you have questions after your visit to [facility], please contact our office at [phone] between the hours of 8:00 a.m. and 4:30 p.m.  Voicemails left after 4:00 p.m. will not be returned until the following business day.  For prescription refill requests, have your pharmacy contact our office and allow 72 hours.    Due to Covid, you will need to wear a mask upon entering the hospital. If you do not have a mask, a mask will be given to you at the Main Entrance upon arrival. For doctor visits, patients may have 1 support person with them. For treatment visits, patients can not have anyone with them due to social distancing guidelines and our immunocompromised population.      

## 2018-12-05 NOTE — Assessment & Plan Note (Addendum)
1.  Stage III adenocarcinoma the sigmoid colon: - She was diagnosed in 05/2004.  She was treated with segmental resection, followed by adjuvant chemo with Xeloda\oxaliplatin x6 cycles; received Avastin with cycle 1, then Avastin discontinued. -She has been in remission since that time. -Last colonoscopy on 08/31/2013 with Dr. Gala Romney.  He recommended repeat colonoscopy in 5 years (09/2018). -Last CEA 03/2016 was 1.8.  No need to continue CEA monitoring beyond 5 years per NCCN guidelines. -Labs on 12/03/2018 showed her hemoglobin 13.6, WBC 7.9, platelets 194, LFTs WNL -She reports she has not scheduled her 5-year colonoscopy with Dr. Gala Romney.  She repeats she is not having any problems and she wishes to wait a little longer however she will contact them within the year. -She will follow-up in 1 year with repeat labs.  2.  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  Treated with neoadjuvant antiestrogen therapy Arimidex x4 months.  Then underwent lumpectomy, followed by adjuvant radiation therapy. -She started on Femara 05/2005 and continued through 05/2007. -She then switched to tamoxifen from 05/2007 through 08/2009. - Screening mammogram done on 12/03/2018 was BI-RADS Category 0 incomplete. -We scheduled her for a diagnostic mammogram on 12/23/2018 due to her BI-RADS Category 0 screening mammogram -We will call her with the results. - Labs on 12/03/2018 showed her potassium 4.5, creatinine 1.21, LDH 146, WBC 7.9, hemoglobin 13.6, platelets 194. -She will follow-up in 1 year with repeat mammogram  3.  Osteoporosis: - Patient's last DEXA scan was on 12/03/2018 with a T score of -4.6. -Patient should continue Prolia injections every 6 months. -Patient refuses to take calcium and vitamin D due to bone aches. -She reports she goes to the dentist every 6 months to have her teeth checked.

## 2018-12-05 NOTE — Progress Notes (Signed)
Patricia Patricia Kline, [place] [address]   CLINIC:  Medical Oncology/Hematology  PCP:  Patricia Patricia Kline, [employer] [place] [address] [phone]   REASON FOR VISIT: Follow-up for colon Patricia Kline and breast Patricia Kline  CURRENT THERAPY: Surveillance per NCCN guidelines  BRIEF ONCOLOGIC HISTORY:  Oncology History  Invasive ductal carcinoma of right breast (Patricia Patricia Kline)  07/19/2004 Imaging   PET- increased uptake in posterior aspect of right breast   08/09/2004 Imaging   Mammogram and [place]   08/09/2004 Initial Diagnosis   Invasive ductal carcinoma of right breast on needle core biopsy of right breast   08/22/2004 Imaging   MRI B/L Breast- 1.8 x 1.4 x 1.3 cm right breast mass   08/29/2004 - 12/13/2004 Chemotherapy   Arimidex x 4 months neoadjuvantly- 77% shrinkage by volume size   12/20/2004 Surgery   Right partial mastectomy- 2 cm, ER 89%, PR 10%, Her2 negative, Ki-67 16%.     02/06/2005 - 03/28/2005 Radiation Therapy   By Dr. Elba Patricia Kline   05/15/2005 - 05/06/2007 Chemotherapy   Femara   08/29/2005 Imaging   Mammogram s/p chemo and resection.  BIRADS 2   05/06/2007 - 09/06/2009 Chemotherapy   Tamoxifen   Adenocarcinoma of sigmoid colon (Patricia Patricia Kline)  05/29/2004 Initial Diagnosis   Adenocarcinoma of sigmoid colon   05/29/2004 Surgery   Segmental resection, 6 cm maximum size, adenocarcinoma, 6/15 lymph nodes positive for disease, T3N2   07/25/2004 - 11/15/2004 Chemotherapy   Xeloda + Oxaliplatin, Avastin x 6 cycles.  Avastin d/c after cycle 1    Remission        Patricia Kline STAGING: Patricia Kline Staging Adenocarcinoma of sigmoid colon (Patricia Patricia Kline) Staging form: Colon and Rectum, AJCC 7th Edition - Clinical: No stage assigned - Unsigned - Pathologic: No stage assigned - Unsigned  Invasive ductal carcinoma of right breast (Patricia Kline) Staging form: Breast, AJCC 7th Edition - Clinical: Stage IA (T1c, N0, cM0) - Signed by Patricia Patricia Kline on 01/11/2013    INTERVAL HISTORY:  Ms.  Patricia Kline 78 y.o. female returns for routine follow-up for colon Patricia Kline and breast Patricia Kline.  She reports she has been doing well since her last visit.  She does report having occasional cramping in her hands and numbness from her chemotherapy years ago.  She denies any bright red bleeding per rectum or melena.  She denies any Patricia lumps or adenopathy. Denies any nausea, vomiting, or diarrhea. Denies any Patricia pains. Had not noticed any recent bleeding such as epistaxis, hematuria or hematochezia. Denies recent chest pain on exertion, shortness of breath on minimal exertion, pre-syncopal episodes, or palpitations. Denies any numbness or tingling in hands or feet. Denies any recent fevers, infections, or recent hospitalizations. Patient reports appetite at 100% and energy level at 75%.  She is eating well maintaining her weight at this time.    REVIEW OF SYSTEMS:  Review of Systems  Neurological: Positive for numbness.  Psychiatric/Behavioral: Positive for depression. The patient is nervous/anxious.   All other systems reviewed and are negative.    PAST MEDICAL/SURGICAL HISTORY:  Past Medical History:  Diagnosis Date  . Adenocarcinoma of sigmoid colon (Patricia Patricia Kline) 11/14/2010  . Arthritis   . Breast CA ([place]) 11/14/2010  . Colon Patricia Kline ([place]) 11/14/2010  . Depression   . Falls   . H/O: hysterectomy   . History of right knee surgery    for bone Patricia Kline right knee  . Hypercholesteremia   . Hypertension   . Invasive ductal carcinoma of right breast (Patricia Patricia Kline)  11/14/2010  . Mitral valve prolapse   . Osteoporosis   . Osteoporosis, senile 03/05/2011  . Peripheral neuropathy   . 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 S/P cataract surgery    bil eyes   Past Surgical History:  Procedure Laterality Date  . ABDOMINAL HYSTERECTOMY    . CATARACT EXTRACTION, Patricia Patricia Kline    . COLONOSCOPY  08/27/2008   Dr. Madolyn Patricia Kline papilla.  Surgical anastomosis at 8-10 cm from the anal verge.  Residual  rectal and colonic mucosa appeared normal.  . COLONOSCOPY N/A 08/31/2013   Procedure: COLONOSCOPY;  Surgeon: Patricia Patricia Kline;  [facility]: [facility];  Service: Endoscopy;  Laterality: N/A;  2:45  . Epidural steroid injection with Kenalog     Midline  . KNEE SURGERY    . PORT-A-CATH REMOVAL  02/04/2012   Procedure: REMOVAL PORT-A-CATH;  Surgeon: Patricia Heinz, Kline;  Location: [facility];  Service: General;  Laterality: N/A;  minor room  . retinal hole repair     Patricia Patricia Kline  . TONSILLECTOMY    . YAG LASER APPLICATION Left 11/24/537   Procedure: YAG LASER APPLICATION;  Surgeon: Patricia Patricia Kline;  Location: [facility];  Service: Ophthalmology;  Laterality: Left;     SOCIAL HISTORY:  Social History   Socioeconomic History  . Marital status: Married    Spouse name: Not on file  . Number of children: Not on file  . Years of education: Not on file  . Highest education level: Not on file  Occupational History  . Not on file  Social Needs  . Financial resource strain: Not on file  . Food insecurity    Worry: Not on file    Inability: Not on file  . Transportation needs    Medical: Not on file    Non-medical: Not on file  Tobacco Use  . Smoking status: Never Smoker  . Smokeless tobacco: Never Used  Substance and Sexual Activity  . Alcohol use: No  . Drug use: No  . Sexual activity: Never  Lifestyle  . Physical activity    Days per week: Not on file    Minutes per session: Not on file  . Stress: Not on file  Relationships  . Social Herbalist on phone: Not on file    Gets together: Not on file    Attends religious service: Not on file    Active member of club or organization: Not on file    Attends meetings of clubs or organizations: Not on file    Relationship status: Not on file  . Intimate partner violence    Fear of current or ex partner: Not on file    Emotionally abused: Not on file    Physically abused: Not on file    Forced sexual activity: Not on file   Other Topics Concern  . Not on file  Social History Narrative  . Not on file    FAMILY HISTORY:  Family History  Problem Relation Age of Onset  . Stroke Mother   . Patricia Kline Mother        breast  Patricia Kline  . Heart attack Father   . Colon Patricia Kline Neg Hx     CURRENT MEDICATIONS:  Outpatient Encounter Medications as of 12/05/2018  Medication Sig Note  . denosumab (PROLIA) 60 MG/ML SOLN injection Inject 60 mg into the skin every 6 (six) months. Administer in upper arm, thigh, or abdomen 01/12/2014: Due in December  2015  . gabapentin (NEURONTIN) 300 MG capsule Take 1 cap at breakfast. Take 2 cap at lunch. Take 2 cap at dinner. Take 1 cap at bedtime.   Marland Kitchen lisinopril-hydrochlorothiazide (PRINZIDE,ZESTORETIC) 20-12.5 MG per tablet Take 1 tablet by mouth daily. Reported on 09/06/2015   . LORazepam (ATIVAN) 1 MG tablet Take 1 mg by mouth every 8 (eight) hours.   Marland Kitchen acetaminophen (TYLENOL) 325 MG tablet Take 650 mg by mouth as needed for mild pain.     No facility-administered encounter medications on file as of 12/05/2018.     ALLERGIES:  Allergies  Allergen Reactions  . Penicillins      PHYSICAL EXAM:  ECOG Performance status: 1  Vitals:   12/05/18 1048  BP: (!) 147/61  Pulse: 68  Resp: 18  Temp: (!) 97.3 F (36.3 C)  SpO2: 100%   Filed Weights   12/05/18 1048  Weight: 116 lb 14.4 oz (53 kg)    Physical Exam Constitutional:      Appearance: Normal appearance. She is normal weight.  Cardiovascular:     Rate and Rhythm: Normal rate and regular rhythm.     Heart sounds: Normal heart sounds.  Pulmonary:     Effort: Pulmonary effort is normal.     Breath sounds: Normal breath sounds.  Abdominal:     General: Bowel sounds are normal.     Palpations: Abdomen is soft.  Musculoskeletal: Normal range of motion.  Skin:    General: Skin is warm and dry.  Neurological:     Mental Status: She is alert and oriented to person, place, and time. Mental status is at baseline.   Psychiatric:        Mood and Affect: Mood normal.        Behavior: Behavior normal.        Thought Content: Thought content normal.        Judgment: Judgment normal.   Breast: Patient deferred breast exam at this time.  She reports she will just have a diagnostic mammogram.  LABORATORY DATA:  I have reviewed the labs as listed.  CBC    Component Value Date/Time   WBC 7.9 12/03/2018 0950   RBC 4.41 12/03/2018 0950   HGB 13.6 12/03/2018 0950   HCT 41.5 12/03/2018 0950   PLT 194 12/03/2018 0950   MCV 94.1 12/03/2018 0950   MCH 30.8 12/03/2018 0950   MCHC 32.8 12/03/2018 0950   RDW 13.2 12/03/2018 0950   LYMPHSABS 1.4 12/03/2018 0950   MONOABS 0.8 12/03/2018 0950   EOSABS 0.1 12/03/2018 0950   BASOSABS 0.1 12/03/2018 0950   CMP Latest Ref Rng & Units 12/03/2018 09/22/2018 03/17/2018  Glucose 70 - 99 mg/dL 78 104(H) 96  BUN 8 - 23 mg/dL _0 Creatinine 0.44 - 1.00 mg/dL 1.21(H) 1.26(H) 1.21(H)  Sodium 135 - 145 mmol/L 137 138 137  Potassium 3.5 - 5.1 mmol/L 4.5 4.1 4.1  Chloride 98 - 111 mmol/L 102 101 103  CO2 22 - 32 mmol/L _1 Calcium 8.9 - 10.3 mg/dL 9.3 9.0 9.1  Total Protein 6.5 - 8.1 g/dL 7.3 6.9 6.7  Total Bilirubin 0.3 - 1.2 mg/dL 0.6 0.6 0.4  Alkaline Phos 38 - 126 U/L 42 47 37(L)  AST 15 - 41 U/L _2 ALT 0 - 44 U/L _3 DIAGNOSTIC IMAGING:  I have independently reviewed the mammogram and DEXA scans and discussed with the patient.    I  personally performed a face-to-face visit.  All questions were answered to patient's stated satisfaction. Encouraged patient to call with any Patricia concerns or questions before his next visit to the Patricia Kline center and we can certain see him sooner, if needed.     ASSESSMENT & PLAN:   Adenocarcinoma of sigmoid colon 1.  Stage III adenocarcinoma the sigmoid colon: - She was diagnosed in 05/2004.  She was treated with segmental resection, followed by adjuvant chemo with Xeloda\oxaliplatin x6 cycles; received  Avastin with cycle 1, then Avastin discontinued. -She has been in remission since that time. -Last colonoscopy on 08/31/2013 with Dr. Gala Romney.  He recommended repeat colonoscopy in 5 years (09/2018). -Last CEA 03/2016 was 1.8.  No need to continue CEA monitoring beyond 5 years per NCCN guidelines. -Labs on 12/03/2018 showed her hemoglobin 13.6, WBC 7.9, platelets 194, LFTs WNL -She reports she has not scheduled her 5-year colonoscopy with Dr. Gala Romney.  She repeats she is not having any problems and she wishes to wait a little longer however she will contact them within the year. -She will follow-up in 1 year with repeat labs.  2.  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  Treated with neoadjuvant antiestrogen therapy Arimidex x4 months.  Then underwent lumpectomy, followed by adjuvant radiation therapy. -She started on Femara 05/2005 and continued through 05/2007. -She then switched to tamoxifen from 05/2007 through 08/2009. - Screening mammogram done on 12/03/2018 was BI-RADS Category 0 incomplete. -We scheduled her for a diagnostic mammogram on 12/23/2018 due to her BI-RADS Category 0 screening mammogram -We will call her with the results. - Labs on 12/03/2018 showed her potassium 4.5, creatinine 1.21, LDH 146, WBC 7.9, hemoglobin 13.6, platelets 194. -She will follow-up in 1 year with repeat mammogram  3.  Osteoporosis: - Patient's last DEXA scan was on 12/03/2018 with a T score of -4.6. -Patient should continue Prolia injections every 6 months. -Patient refuses to take calcium and vitamin D due to bone aches. -She reports she goes to the dentist every 6 months to have her teeth checked.      Orders placed this encounter:  Orders Placed This Encounter  Procedures  . Lactate dehydrogenase  . CBC with Differential/Platelet  . Comprehensive metabolic panel  . Ferritin  . Iron and TIBC  . Vitamin B12  . VITAMIN D 25 Hydroxy (Vit-D Deficiency, Fractures)  .  Folate      Patricia Finders, FNP-C [place] [phone]

## 2018-12-10 ENCOUNTER — Other Ambulatory Visit (HOSPITAL_COMMUNITY): Payer: Self-pay | Admitting: Nurse Practitioner

## 2018-12-10 DIAGNOSIS — C187 Malignant neoplasm of sigmoid colon: Secondary | ICD-10-CM

## 2018-12-16 ENCOUNTER — Ambulatory Visit (HOSPITAL_COMMUNITY)
Admission: RE | Admit: 2018-12-16 | Discharge: 2018-12-16 | Disposition: A | Discharge status: Home / Self Care | Payer: Medicare Other | Source: Ambulatory Visit | Attending: Nurse Practitioner | Admitting: Nurse Practitioner

## 2018-12-16 ENCOUNTER — Ambulatory Visit (HOSPITAL_COMMUNITY): Admission: RE | Admit: 2018-12-16 | Payer: Medicare Other | Source: Ambulatory Visit

## 2018-12-16 ENCOUNTER — Other Ambulatory Visit: Payer: Self-pay

## 2018-12-16 DIAGNOSIS — R921 Mammographic calcification found on diagnostic imaging of breast: Secondary | ICD-10-CM | POA: Diagnosis not present

## 2018-12-18 ENCOUNTER — Other Ambulatory Visit (HOSPITAL_COMMUNITY): Payer: Self-pay | Admitting: Nurse Practitioner

## 2018-12-18 ENCOUNTER — Other Ambulatory Visit (HOSPITAL_COMMUNITY): Payer: Self-pay | Admitting: *Deleted

## 2018-12-18 DIAGNOSIS — R921 Mammographic calcification found on diagnostic imaging of breast: Secondary | ICD-10-CM

## 2018-12-18 DIAGNOSIS — N63 Unspecified lump in unspecified breast: Secondary | ICD-10-CM

## 2018-12-18 NOTE — Progress Notes (Signed)
Based on recommendations from radiology after patient's mammogram, Patricia Fillers, NP aware and she will place orders.

## 2018-12-23 ENCOUNTER — Other Ambulatory Visit (HOSPITAL_COMMUNITY): Payer: Medicare Other

## 2018-12-23 ENCOUNTER — Encounter (HOSPITAL_COMMUNITY): Payer: Medicare Other

## 2018-12-26 ENCOUNTER — Ambulatory Visit
Admission: RE | Admit: 2018-12-26 | Discharge: 2018-12-26 | Disposition: A | Discharge status: Home / Self Care | Payer: Medicare Other | Source: Ambulatory Visit | Attending: Nurse Practitioner | Admitting: Nurse Practitioner

## 2018-12-26 ENCOUNTER — Other Ambulatory Visit: Payer: Self-pay

## 2018-12-26 ENCOUNTER — Other Ambulatory Visit (HOSPITAL_COMMUNITY): Payer: Self-pay | Admitting: Diagnostic Radiology

## 2018-12-26 DIAGNOSIS — R921 Mammographic calcification found on diagnostic imaging of breast: Secondary | ICD-10-CM

## 2018-12-26 DIAGNOSIS — D242 Benign neoplasm of left breast: Secondary | ICD-10-CM | POA: Diagnosis not present

## 2018-12-30 DIAGNOSIS — Z23 Encounter for immunization: Secondary | ICD-10-CM | POA: Diagnosis not present

## 2019-01-20 ENCOUNTER — Other Ambulatory Visit (HOSPITAL_COMMUNITY): Payer: Self-pay | Admitting: Nurse Practitioner

## 2019-01-20 DIAGNOSIS — C50919 Malignant neoplasm of unspecified site of unspecified female breast: Secondary | ICD-10-CM

## 2019-01-21 ENCOUNTER — Other Ambulatory Visit (HOSPITAL_COMMUNITY): Payer: Self-pay | Admitting: Nurse Practitioner

## 2019-01-21 DIAGNOSIS — Z1231 Encounter for screening mammogram for malignant neoplasm of breast: Secondary | ICD-10-CM

## 2019-01-29 DIAGNOSIS — Z6820 Body mass index (BMI) 20.0-20.9, adult: Secondary | ICD-10-CM | POA: Diagnosis not present

## 2019-01-29 DIAGNOSIS — C50911 Malignant neoplasm of unspecified site of right female breast: Secondary | ICD-10-CM | POA: Diagnosis not present

## 2019-01-29 DIAGNOSIS — E785 Hyperlipidemia, unspecified: Secondary | ICD-10-CM | POA: Diagnosis not present

## 2019-01-29 DIAGNOSIS — Z1389 Encounter for screening for other disorder: Secondary | ICD-10-CM | POA: Diagnosis not present

## 2019-01-29 DIAGNOSIS — E782 Mixed hyperlipidemia: Secondary | ICD-10-CM | POA: Diagnosis not present

## 2019-01-29 DIAGNOSIS — I10 Essential (primary) hypertension: Secondary | ICD-10-CM | POA: Diagnosis not present

## 2019-01-29 DIAGNOSIS — C187 Malignant neoplasm of sigmoid colon: Secondary | ICD-10-CM | POA: Diagnosis not present

## 2019-01-29 DIAGNOSIS — Z0001 Encounter for general adult medical examination with abnormal findings: Secondary | ICD-10-CM | POA: Diagnosis not present

## 2019-01-29 DIAGNOSIS — D473 Essential (hemorrhagic) thrombocythemia: Secondary | ICD-10-CM | POA: Diagnosis not present

## 2019-02-23 DIAGNOSIS — R350 Frequency of micturition: Secondary | ICD-10-CM | POA: Diagnosis not present

## 2019-02-23 DIAGNOSIS — N390 Urinary tract infection, site not specified: Secondary | ICD-10-CM | POA: Diagnosis not present

## 2019-03-02 DIAGNOSIS — I129 Hypertensive chronic kidney disease with stage 1 through stage 4 chronic kidney disease, or unspecified chronic kidney disease: Secondary | ICD-10-CM | POA: Diagnosis not present

## 2019-03-02 DIAGNOSIS — Z961 Presence of intraocular lens: Secondary | ICD-10-CM | POA: Diagnosis not present

## 2019-03-02 DIAGNOSIS — N1831 Chronic kidney disease, stage 3a: Secondary | ICD-10-CM | POA: Diagnosis not present

## 2019-03-02 DIAGNOSIS — E7849 Other hyperlipidemia: Secondary | ICD-10-CM | POA: Diagnosis not present

## 2019-03-02 DIAGNOSIS — M1991 Primary osteoarthritis, unspecified site: Secondary | ICD-10-CM | POA: Diagnosis not present

## 2019-03-02 DIAGNOSIS — H353131 Nonexudative age-related macular degeneration, bilateral, early dry stage: Secondary | ICD-10-CM | POA: Diagnosis not present

## 2019-03-03 DIAGNOSIS — R350 Frequency of micturition: Secondary | ICD-10-CM | POA: Diagnosis not present

## 2019-03-03 DIAGNOSIS — N342 Other urethritis: Secondary | ICD-10-CM | POA: Diagnosis not present

## 2019-03-03 DIAGNOSIS — Z6820 Body mass index (BMI) 20.0-20.9, adult: Secondary | ICD-10-CM | POA: Diagnosis not present

## 2019-03-25 ENCOUNTER — Other Ambulatory Visit: Payer: Self-pay

## 2019-03-25 ENCOUNTER — Inpatient Hospital Stay (HOSPITAL_COMMUNITY): Payer: Medicare Other | Attending: Hematology

## 2019-03-25 ENCOUNTER — Inpatient Hospital Stay (HOSPITAL_COMMUNITY): Payer: Medicare Other

## 2019-03-25 VITALS — BP 142/56 | HR 61 | Temp 97.1°F | Resp 16

## 2019-03-25 DIAGNOSIS — Z853 Personal history of malignant neoplasm of breast: Secondary | ICD-10-CM | POA: Diagnosis not present

## 2019-03-25 DIAGNOSIS — Z85038 Personal history of other malignant neoplasm of large intestine: Secondary | ICD-10-CM | POA: Diagnosis not present

## 2019-03-25 DIAGNOSIS — M818 Other osteoporosis without current pathological fracture: Secondary | ICD-10-CM | POA: Insufficient documentation

## 2019-03-25 DIAGNOSIS — M810 Age-related osteoporosis without current pathological fracture: Secondary | ICD-10-CM

## 2019-03-25 DIAGNOSIS — C187 Malignant neoplasm of sigmoid colon: Secondary | ICD-10-CM

## 2019-03-25 LAB — CBC WITH DIFFERENTIAL/PLATELET
Abs Immature Granulocytes: 0.03 10*3/uL (ref 0.00–0.07)
Basophils Absolute: 0 10*3/uL (ref 0.0–0.1)
Basophils Relative: 1 %
Eosinophils Absolute: 0.1 10*3/uL (ref 0.0–0.5)
Eosinophils Relative: 1 %
HCT: 38.6 % (ref 36.0–46.0)
Hemoglobin: 12.5 g/dL (ref 12.0–15.0)
Immature Granulocytes: 0 %
Lymphocytes Relative: 17 %
Lymphs Abs: 1.2 10*3/uL (ref 0.7–4.0)
MCH: 30.7 pg (ref 26.0–34.0)
MCHC: 32.4 g/dL (ref 30.0–36.0)
MCV: 94.8 fL (ref 80.0–100.0)
Monocytes Absolute: 0.7 10*3/uL (ref 0.1–1.0)
Monocytes Relative: 10 %
Neutro Abs: 4.8 10*3/uL (ref 1.7–7.7)
Neutrophils Relative %: 71 %
Platelets: 191 10*3/uL (ref 150–400)
RBC: 4.07 MIL/uL (ref 3.87–5.11)
RDW: 12.6 % (ref 11.5–15.5)
WBC: 6.8 10*3/uL (ref 4.0–10.5)
nRBC: 0 % (ref 0.0–0.2)

## 2019-03-25 LAB — COMPREHENSIVE METABOLIC PANEL
ALT: 13 U/L (ref 0–44)
AST: 16 U/L (ref 15–41)
Albumin: 4.6 g/dL (ref 3.5–5.0)
Alkaline Phosphatase: 44 U/L (ref 38–126)
Anion gap: 13 (ref 5–15)
BUN: 22 mg/dL (ref 8–23)
CO2: 26 mmol/L (ref 22–32)
Calcium: 9.7 mg/dL (ref 8.9–10.3)
Chloride: 99 mmol/L (ref 98–111)
Creatinine, Ser: 1.33 mg/dL — ABNORMAL HIGH (ref 0.44–1.00)
GFR calc Af Amer: 45 mL/min — ABNORMAL LOW (ref >60)
GFR calc non Af Amer: 38 mL/min — ABNORMAL LOW (ref >60)
Glucose, Bld: 98 mg/dL (ref 70–99)
Potassium: 4.4 mmol/L (ref 3.5–5.1)
Sodium: 138 mmol/L (ref 135–145)
Total Bilirubin: 0.6 mg/dL (ref 0.3–1.2)
Total Protein: 7.3 g/dL (ref 6.5–8.1)

## 2019-03-25 MED ORDER — DENOSUMAB 60 MG/ML ~~LOC~~ SOSY
60.0000 mg | PREFILLED_SYRINGE | Freq: Once | SUBCUTANEOUS | Status: AC
  Administered 2019-03-25: 60 mg via SUBCUTANEOUS
  Filled 2019-03-25: qty 1

## 2019-03-25 NOTE — Patient Instructions (Signed)
[place] at [facility]  Discharge Instructions:  Prolia injection received today. _______________________________________________________________  Thank you for choosing [place] at [facility] to provide your oncology and hematology care.  To afford each patient quality time with our providers, please arrive at least 15 minutes before your scheduled appointment.  You need to re-schedule your appointment if you arrive 10 or more minutes late.  We strive to give you quality time with our providers, and arriving late affects you and other patients whose appointments are after yours.  Also, if you no show three or more times for appointments you may be dismissed from the clinic.  Again, thank you for choosing [place] at [facility] hope is that these requests will allow you access to exceptional care and in a timely manner. _______________________________________________________________  If you have questions after your visit, please contact our office at [phone] between the hours of 8:30 a.m. and 5:00 p.m. Voicemails left after 4:30 p.m. will not be returned until the following business day. _______________________________________________________________  For prescription refill requests, have your pharmacy contact our office. _______________________________________________________________  Recommendations made by the consultant and any test results will be sent to your referring physician. _______________________________________________________________

## 2019-03-25 NOTE — Progress Notes (Signed)
Patricia Kline presents today for denosumab injection. Lab work reviewed prior to administration. VSS. Pt reports taking Ca and Vit D as instructed. Pt denies tooth/jaw pain and denies recent or future invasive dental work. Injection tolerated well, see MAR for details. Site clean and dry, band aid applied. Pt discharged in satisfactory condition with follow up instructions.

## 2019-04-02 DIAGNOSIS — I129 Hypertensive chronic kidney disease with stage 1 through stage 4 chronic kidney disease, or unspecified chronic kidney disease: Secondary | ICD-10-CM | POA: Diagnosis not present

## 2019-04-02 DIAGNOSIS — N1831 Chronic kidney disease, stage 3a: Secondary | ICD-10-CM | POA: Diagnosis not present

## 2019-04-02 DIAGNOSIS — T451X1S Poisoning by antineoplastic and immunosuppressive drugs, accidental (unintentional), sequela: Secondary | ICD-10-CM | POA: Diagnosis not present

## 2019-04-02 DIAGNOSIS — G620 Drug-induced polyneuropathy: Secondary | ICD-10-CM | POA: Diagnosis not present

## 2019-05-03 DIAGNOSIS — I129 Hypertensive chronic kidney disease with stage 1 through stage 4 chronic kidney disease, or unspecified chronic kidney disease: Secondary | ICD-10-CM | POA: Diagnosis not present

## 2019-05-03 DIAGNOSIS — G620 Drug-induced polyneuropathy: Secondary | ICD-10-CM | POA: Diagnosis not present

## 2019-05-03 DIAGNOSIS — T451X1S Poisoning by antineoplastic and immunosuppressive drugs, accidental (unintentional), sequela: Secondary | ICD-10-CM | POA: Diagnosis not present

## 2019-05-03 DIAGNOSIS — N1831 Chronic kidney disease, stage 3a: Secondary | ICD-10-CM | POA: Diagnosis not present

## 2019-05-16 DIAGNOSIS — Z23 Encounter for immunization: Secondary | ICD-10-CM | POA: Diagnosis not present

## 2019-05-31 DIAGNOSIS — G620 Drug-induced polyneuropathy: Secondary | ICD-10-CM | POA: Diagnosis not present

## 2019-05-31 DIAGNOSIS — N1831 Chronic kidney disease, stage 3a: Secondary | ICD-10-CM | POA: Diagnosis not present

## 2019-05-31 DIAGNOSIS — I129 Hypertensive chronic kidney disease with stage 1 through stage 4 chronic kidney disease, or unspecified chronic kidney disease: Secondary | ICD-10-CM | POA: Diagnosis not present

## 2019-05-31 DIAGNOSIS — T451X1S Poisoning by antineoplastic and immunosuppressive drugs, accidental (unintentional), sequela: Secondary | ICD-10-CM | POA: Diagnosis not present

## 2019-06-13 DIAGNOSIS — Z23 Encounter for immunization: Secondary | ICD-10-CM | POA: Diagnosis not present

## 2019-07-01 DIAGNOSIS — N1831 Chronic kidney disease, stage 3a: Secondary | ICD-10-CM | POA: Diagnosis not present

## 2019-07-01 DIAGNOSIS — I129 Hypertensive chronic kidney disease with stage 1 through stage 4 chronic kidney disease, or unspecified chronic kidney disease: Secondary | ICD-10-CM | POA: Diagnosis not present

## 2019-07-01 DIAGNOSIS — G620 Drug-induced polyneuropathy: Secondary | ICD-10-CM | POA: Diagnosis not present

## 2019-07-01 DIAGNOSIS — T451X1S Poisoning by antineoplastic and immunosuppressive drugs, accidental (unintentional), sequela: Secondary | ICD-10-CM | POA: Diagnosis not present

## 2019-07-31 DIAGNOSIS — T451X1S Poisoning by antineoplastic and immunosuppressive drugs, accidental (unintentional), sequela: Secondary | ICD-10-CM | POA: Diagnosis not present

## 2019-07-31 DIAGNOSIS — I129 Hypertensive chronic kidney disease with stage 1 through stage 4 chronic kidney disease, or unspecified chronic kidney disease: Secondary | ICD-10-CM | POA: Diagnosis not present

## 2019-07-31 DIAGNOSIS — G620 Drug-induced polyneuropathy: Secondary | ICD-10-CM | POA: Diagnosis not present

## 2019-07-31 DIAGNOSIS — N1831 Chronic kidney disease, stage 3a: Secondary | ICD-10-CM | POA: Diagnosis not present

## 2019-08-28 ENCOUNTER — Other Ambulatory Visit: Payer: Self-pay

## 2019-08-28 ENCOUNTER — Ambulatory Visit (INDEPENDENT_AMBULATORY_CARE_PROVIDER_SITE_OTHER): Payer: Medicare Other | Admitting: Gastroenterology

## 2019-08-28 ENCOUNTER — Encounter: Payer: Self-pay | Admitting: Gastroenterology

## 2019-08-28 VITALS — BP 131/62 | HR 78 | Temp 97.1°F | Ht 64.0 in | Wt 119.0 lb

## 2019-08-28 DIAGNOSIS — C187 Malignant neoplasm of sigmoid colon: Secondary | ICD-10-CM

## 2019-08-28 NOTE — Patient Instructions (Signed)
I am glad you are doing well!  Please let me know if anything changes.   We will see you back as needed!  It was a pleasure to see you today. I want to create trusting relationships with patients to provide genuine, compassionate, and quality care. I value your feedback. If you receive a survey regarding your visit,  I greatly appreciate you taking time to fill this out.   Annitta Needs, PhD, ANP-BC [facility]

## 2019-08-28 NOTE — Progress Notes (Signed)
Primary Care Physician:  Sharilyn Sites, MD Primary Gastroenterologist:  Dr. Gala Romney  Chief Complaint  Patient presents with  . Colonoscopy    wants to discuss if tcs is needed, not having any problems    HPI:   Patricia Kline is a 80 y.o. female presenting today with a history of colon cancer in remote past (2006), s/p LAR. Last surveillance completed in 2015 was normal residual rectum/colon.   Keeps records of BMs. If mild constipation will take a stool softener, but this is not often. No rectal bleeding. Was due for surveillance last year but due to COVID didn't want to pursue this. No abdominal pain. Good appetite. States she has actually gained weight during COVID. No N/V. Occasional indigestion.   Received COVID vaccine in Feb/March 2021. She is not wanting to pursue surveillance colonoscopy at this time.   Past Medical History:  Diagnosis Date  . Adenocarcinoma of sigmoid colon ([place]) 11/14/2010  . Arthritis   . Breast CA ([place]) 11/14/2010  . Colon cancer ([place]) 11/14/2010  . Depression   . Falls   . H/O: hysterectomy   . History of right knee surgery    for bone cancer right knee  . Hypercholesteremia   . Hypertension   . Invasive ductal carcinoma of right breast ([place]) 11/14/2010  . Mitral valve prolapse   . Osteoporosis   . Osteoporosis, senile 03/05/2011  . Peripheral neuropathy   . 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 S/P cataract surgery    bil eyes    Past Surgical History:  Procedure Laterality Date  . ABDOMINAL HYSTERECTOMY    . CATARACT EXTRACTION, BILATERAL    . COLONOSCOPY  08/27/2008   Dr. Madolyn Frieze papilla.  Surgical anastomosis at 8-10 cm from the anal verge.  Residual rectal and colonic mucosa appeared normal.  . COLONOSCOPY N/A 08/31/2013   normal residual rectum/colon.   Marland Kitchen Epidural steroid injection with Kenalog     Midline  . KNEE SURGERY    . LOW ANTERIOR BOWEL RESECTION    . PORT-A-CATH REMOVAL  02/04/2012   Procedure: REMOVAL PORT-A-CATH;  Surgeon: Donato Heinz, MD;  Location: [facility];  Service: General;  Laterality: N/A;  minor room  . retinal hole repair     bilateral  . TONSILLECTOMY    . YAG LASER APPLICATION Left [id number]   Procedure: YAG LASER APPLICATION;  Surgeon: Elta Guadeloupe T. Gershon Crane, MD;  Location: [facility];  Service: Ophthalmology;  Laterality: Left;    Current Outpatient Medications  Medication Sig Dispense Refill  . acetaminophen (TYLENOL) 325 MG tablet Take 650 mg by mouth as needed for mild pain.     Marland Kitchen denosumab (PROLIA) 60 MG/ML SOLN injection Inject 60 mg into the skin every 6 (six) months. Administer in upper arm, thigh, or abdomen    . gabapentin (NEURONTIN) 300 MG capsule Take 1 cap at breakfast. Take 2 cap at lunch. Take 2 cap at dinner. Take 1 cap at bedtime. (Patient taking differently: Take 1 cap at breakfast. Take 1 cap at lunch. Take 1 cap at dinner. Take 1 cap at bedtime.) 180 capsule 6  . lisinopril-hydrochlorothiazide (PRINZIDE,ZESTORETIC) 20-12.5 MG per tablet Take 1 tablet by mouth daily. Reported on 09/06/2015    . LORazepam (ATIVAN) 1 MG tablet Take 1 mg by mouth as needed. Takes 1 tablet daily and then may take more if needed during the day     No current facility-administered medications for this visit.  Allergies as of 08/28/2019 - Review Complete 08/28/2019  Allergen Reaction Noted  . Penicillins  11/22/2010    Family History  Problem Relation Age of Onset  . Stroke Mother   . Cancer Mother        breast  cancer  . Heart attack Father   . Colon cancer Neg Hx     Social History   Socioeconomic History  . Marital status: Married    Spouse name: Not on file  . Number of children: Not on file  . Years of education: Not on file  . Highest education level: Not on file  Occupational History  . Not on file  Tobacco Use  . Smoking status: Never Smoker  . Smokeless tobacco: Never Used  Substance and Sexual Activity  . Alcohol use: No  . Drug use: No   . Sexual activity: Never  Other Topics Concern  . Not on file  Social History Narrative  . Not on file   Social Determinants of Health   Financial Resource Strain:   . Difficulty of Paying Living Expenses:   Food Insecurity:   . Worried About Charity fundraiser in the Last Year:   . Arboriculturist in the Last Year:   Transportation Needs:   . Film/video editor (Medical):   Marland Kitchen Lack of Transportation (Non-Medical):   Physical Activity:   . Days of Exercise per Week:   . Minutes of Exercise per Session:   Stress:   . Feeling of Stress :   Social Connections:   . Frequency of Communication with Friends and Family:   . Frequency of Social Gatherings with Friends and Family:   . Attends Religious Services:   . Active Member of Clubs or Organizations:   . Attends Archivist Meetings:   Marland Kitchen Marital Status:   Intimate Partner Violence:   . Fear of Current or Ex-Partner:   . Emotionally Abused:   Marland Kitchen Physically Abused:   . Sexually Abused:     Review of Systems: Gen: Denies any fever, chills, fatigue, weight loss, lack of appetite.  CV: Denies chest pain, heart palpitations, peripheral edema, syncope.  Resp: Denies shortness of breath at rest or with exertion. Denies wheezing or cough.  GI: see HPI GU : Denies urinary burning, urinary frequency, urinary hesitancy MS: Denies joint pain, muscle weakness, cramps, or limitation of movement.  Derm: Denies rash, itching, dry skin Psych: Denies depression, anxiety, memory loss, and confusion Heme: Denies bruising, bleeding, and enlarged lymph nodes.  Physical Exam: BP 131/62   Pulse 78   Temp (!) 97.1 F (36.2 C) (Temporal)   Ht 5\' 4"  (1.626 m)   Wt 119 lb (54 kg)   BMI 20.43 kg/m  General:   Alert and oriented. Pleasant and cooperative. Well-nourished and well-developed.  Head:  Normocephalic and atraumatic. Eyes:  Without icterus, sclera clear and conjunctiva pink.  Ears:  Normal auditory acuity. Mouth:  Mask  in place Lungs:  Clear to auscultation bilaterally. No wheezes, rales, or rhonchi. No distress.  Heart:  S1, S2 present without murmurs appreciated.  Abdomen:  +BS, soft, non-tender and non-distended. No HSM noted. No guarding or rebound. No masses appreciated.  Rectal:  Deferred  Msk:  Symmetrical without gross deformities. Normal posture. Extremities:  Without edema. Neurologic:  Alert and  oriented x4;  grossly normal neurologically. Skin:  Intact without significant lesions or rashes. Psych:  Alert and cooperative. Normal mood and affect.  ASSESSMENT: Patricia Pistorio  Kline is a 79 y.o. female presenting today with a remote history of colon cancer in 2006 s/p LAR, with surveillance colonoscopy last in 2015. She returns to discuss surveillance colonoscopy at this time.  She has no alarm signs/symptoms, and she is in good health. I discussed with her that at her age, pursuing surveillance is on a case-by-case basis, and I feel she would be a good candidate to pursue one final surveillance. However, she is desiring to hold off right now unless any concerning signs/symptoms. She is well-versed on concerning findings and able to report this during the visit. We will see her back as needed.    PLAN:  Return as needed  Call if any concerning changes   Annitta Needs, PhD, [facility]

## 2019-08-31 DIAGNOSIS — N1831 Chronic kidney disease, stage 3a: Secondary | ICD-10-CM | POA: Diagnosis not present

## 2019-08-31 DIAGNOSIS — T451X1S Poisoning by antineoplastic and immunosuppressive drugs, accidental (unintentional), sequela: Secondary | ICD-10-CM | POA: Diagnosis not present

## 2019-08-31 DIAGNOSIS — I129 Hypertensive chronic kidney disease with stage 1 through stage 4 chronic kidney disease, or unspecified chronic kidney disease: Secondary | ICD-10-CM | POA: Diagnosis not present

## 2019-08-31 DIAGNOSIS — G620 Drug-induced polyneuropathy: Secondary | ICD-10-CM | POA: Diagnosis not present

## 2019-09-25 ENCOUNTER — Other Ambulatory Visit (HOSPITAL_COMMUNITY): Payer: Self-pay | Admitting: *Deleted

## 2019-09-25 DIAGNOSIS — C187 Malignant neoplasm of sigmoid colon: Secondary | ICD-10-CM

## 2019-09-25 DIAGNOSIS — M810 Age-related osteoporosis without current pathological fracture: Secondary | ICD-10-CM

## 2019-09-25 DIAGNOSIS — C50919 Malignant neoplasm of unspecified site of unspecified female breast: Secondary | ICD-10-CM

## 2019-09-28 ENCOUNTER — Inpatient Hospital Stay (HOSPITAL_COMMUNITY): Payer: Medicare Other | Attending: Nurse Practitioner

## 2019-09-28 ENCOUNTER — Other Ambulatory Visit: Payer: Self-pay

## 2019-09-28 ENCOUNTER — Encounter (HOSPITAL_COMMUNITY): Payer: Self-pay

## 2019-09-28 ENCOUNTER — Inpatient Hospital Stay (HOSPITAL_COMMUNITY): Payer: Medicare Other

## 2019-09-28 VITALS — BP 139/57 | HR 57 | Temp 97.4°F | Resp 18

## 2019-09-28 DIAGNOSIS — M818 Other osteoporosis without current pathological fracture: Secondary | ICD-10-CM | POA: Insufficient documentation

## 2019-09-28 DIAGNOSIS — C50919 Malignant neoplasm of unspecified site of unspecified female breast: Secondary | ICD-10-CM

## 2019-09-28 DIAGNOSIS — C187 Malignant neoplasm of sigmoid colon: Secondary | ICD-10-CM | POA: Diagnosis not present

## 2019-09-28 DIAGNOSIS — M810 Age-related osteoporosis without current pathological fracture: Secondary | ICD-10-CM

## 2019-09-28 LAB — COMPREHENSIVE METABOLIC PANEL
ALT: 12 U/L (ref 0–44)
AST: 20 U/L (ref 15–41)
Albumin: 4.3 g/dL (ref 3.5–5.0)
Alkaline Phosphatase: 42 U/L (ref 38–126)
Anion gap: 9 (ref 5–15)
BUN: 19 mg/dL (ref 8–23)
CO2: 28 mmol/L (ref 22–32)
Calcium: 9.1 mg/dL (ref 8.9–10.3)
Chloride: 100 mmol/L (ref 98–111)
Creatinine, Ser: 1.19 mg/dL — ABNORMAL HIGH (ref 0.44–1.00)
GFR calc Af Amer: 51 mL/min — ABNORMAL LOW (ref >60)
GFR calc non Af Amer: 44 mL/min — ABNORMAL LOW (ref >60)
Glucose, Bld: 109 mg/dL — ABNORMAL HIGH (ref 70–99)
Potassium: 4.3 mmol/L (ref 3.5–5.1)
Sodium: 137 mmol/L (ref 135–145)
Total Bilirubin: 0.6 mg/dL (ref 0.3–1.2)
Total Protein: 6.7 g/dL (ref 6.5–8.1)

## 2019-09-28 MED ORDER — DENOSUMAB 60 MG/ML ~~LOC~~ SOSY
60.0000 mg | PREFILLED_SYRINGE | Freq: Once | SUBCUTANEOUS | Status: AC
  Administered 2019-09-28: 60 mg via SUBCUTANEOUS

## 2019-09-28 MED ORDER — DENOSUMAB 60 MG/ML ~~LOC~~ SOSY
PREFILLED_SYRINGE | SUBCUTANEOUS | Status: AC
  Filled 2019-09-28: qty 1

## 2019-09-28 NOTE — Patient Instructions (Signed)
[place] at [facility] Discharge Instructions  Received Prolia injection today. Follow-up as scheduled   Thank you for choosing [place] at [facility] to provide your oncology and hematology care.  To afford each patient quality time with our provider, please arrive at least 15 minutes before your scheduled appointment time.   If you have a lab appointment with the [facility] please come in thru the Main Entrance and check in at the main information desk.  You need to re-schedule your appointment should you arrive 10 or more minutes late.  We strive to give you quality time with our providers, and arriving late affects you and other patients whose appointments are after yours.  Also, if you no show three or more times for appointments you may be dismissed from the clinic at the providers discretion.     Again, thank you for choosing [facility].  Our hope is that these requests will decrease the amount of time that you wait before being seen by our physicians.       _____________________________________________________________  Should you have questions after your visit to [facility], please contact our office at [phone] between the hours of 8:00 a.m. and 4:30 p.m.  Voicemails left after 4:00 p.m. will not be returned until the following business day.  For prescription refill requests, have your pharmacy contact our office and allow 72 hours.    Due to Covid, you will need to wear a mask upon entering the hospital. If you do not have a mask, a mask will be given to you at the Main Entrance upon arrival. For doctor visits, patients may have 1 support person with them. For treatment visits, patients can not have anyone with them due to social distancing guidelines and our immunocompromised population.

## 2019-09-28 NOTE — Progress Notes (Signed)
Patricia Kline tolerated Prolia injection well without complaints or incident. Calcium 9.1 today and pt denied any tooth or jaw pain and no recent or future dental visits prior to administering this medication. Pt does not take Calcium PO because it makes her feel achy per pt. VSS Pt discharged self ambulatory in satisfactory ocndition

## 2019-09-30 DIAGNOSIS — G620 Drug-induced polyneuropathy: Secondary | ICD-10-CM | POA: Diagnosis not present

## 2019-09-30 DIAGNOSIS — I129 Hypertensive chronic kidney disease with stage 1 through stage 4 chronic kidney disease, or unspecified chronic kidney disease: Secondary | ICD-10-CM | POA: Diagnosis not present

## 2019-09-30 DIAGNOSIS — T451X1S Poisoning by antineoplastic and immunosuppressive drugs, accidental (unintentional), sequela: Secondary | ICD-10-CM | POA: Diagnosis not present

## 2019-09-30 DIAGNOSIS — N1831 Chronic kidney disease, stage 3a: Secondary | ICD-10-CM | POA: Diagnosis not present

## 2019-10-30 DIAGNOSIS — I129 Hypertensive chronic kidney disease with stage 1 through stage 4 chronic kidney disease, or unspecified chronic kidney disease: Secondary | ICD-10-CM | POA: Diagnosis not present

## 2019-10-30 DIAGNOSIS — G620 Drug-induced polyneuropathy: Secondary | ICD-10-CM | POA: Diagnosis not present

## 2019-10-30 DIAGNOSIS — N1831 Chronic kidney disease, stage 3a: Secondary | ICD-10-CM | POA: Diagnosis not present

## 2019-10-30 DIAGNOSIS — T451X1S Poisoning by antineoplastic and immunosuppressive drugs, accidental (unintentional), sequela: Secondary | ICD-10-CM | POA: Diagnosis not present

## 2019-11-04 DIAGNOSIS — Z08 Encounter for follow-up examination after completed treatment for malignant neoplasm: Secondary | ICD-10-CM | POA: Diagnosis not present

## 2019-11-04 DIAGNOSIS — Z85828 Personal history of other malignant neoplasm of skin: Secondary | ICD-10-CM | POA: Diagnosis not present

## 2019-11-04 DIAGNOSIS — L821 Other seborrheic keratosis: Secondary | ICD-10-CM | POA: Diagnosis not present

## 2019-11-11 DIAGNOSIS — I10 Essential (primary) hypertension: Secondary | ICD-10-CM | POA: Diagnosis not present

## 2019-11-11 DIAGNOSIS — R52 Pain, unspecified: Secondary | ICD-10-CM | POA: Diagnosis not present

## 2019-11-17 DIAGNOSIS — Z6820 Body mass index (BMI) 20.0-20.9, adult: Secondary | ICD-10-CM | POA: Diagnosis not present

## 2019-11-17 DIAGNOSIS — T148XXA Other injury of unspecified body region, initial encounter: Secondary | ICD-10-CM | POA: Diagnosis not present

## 2019-11-17 DIAGNOSIS — Z1389 Encounter for screening for other disorder: Secondary | ICD-10-CM | POA: Diagnosis not present

## 2019-12-01 DIAGNOSIS — N1831 Chronic kidney disease, stage 3a: Secondary | ICD-10-CM | POA: Diagnosis not present

## 2019-12-01 DIAGNOSIS — I129 Hypertensive chronic kidney disease with stage 1 through stage 4 chronic kidney disease, or unspecified chronic kidney disease: Secondary | ICD-10-CM | POA: Diagnosis not present

## 2019-12-08 ENCOUNTER — Inpatient Hospital Stay (HOSPITAL_COMMUNITY): Payer: Medicare Other

## 2019-12-09 ENCOUNTER — Other Ambulatory Visit (HOSPITAL_COMMUNITY): Payer: Self-pay | Admitting: *Deleted

## 2019-12-09 ENCOUNTER — Encounter (HOSPITAL_COMMUNITY): Payer: Medicare Other

## 2019-12-09 ENCOUNTER — Other Ambulatory Visit: Payer: Self-pay

## 2019-12-09 ENCOUNTER — Inpatient Hospital Stay (HOSPITAL_COMMUNITY): Payer: Medicare Other | Attending: Hematology

## 2019-12-09 ENCOUNTER — Ambulatory Visit (HOSPITAL_COMMUNITY)
Admission: RE | Admit: 2019-12-09 | Discharge: 2019-12-09 | Disposition: A | Discharge status: Home / Self Care | Payer: Medicare Other | Source: Ambulatory Visit | Attending: Nurse Practitioner | Admitting: Nurse Practitioner

## 2019-12-09 DIAGNOSIS — C50919 Malignant neoplasm of unspecified site of unspecified female breast: Secondary | ICD-10-CM

## 2019-12-09 DIAGNOSIS — Z853 Personal history of malignant neoplasm of breast: Secondary | ICD-10-CM | POA: Insufficient documentation

## 2019-12-09 DIAGNOSIS — Z1231 Encounter for screening mammogram for malignant neoplasm of breast: Secondary | ICD-10-CM | POA: Insufficient documentation

## 2019-12-09 LAB — CBC WITH DIFFERENTIAL/PLATELET
Abs Immature Granulocytes: 0.05 10*3/uL (ref 0.00–0.07)
Basophils Absolute: 0 10*3/uL (ref 0.0–0.1)
Basophils Relative: 1 %
Eosinophils Absolute: 0.1 10*3/uL (ref 0.0–0.5)
Eosinophils Relative: 1 %
HCT: 40.6 % (ref 36.0–46.0)
Hemoglobin: 13.2 g/dL (ref 12.0–15.0)
Immature Granulocytes: 1 %
Lymphocytes Relative: 17 %
Lymphs Abs: 1.3 10*3/uL (ref 0.7–4.0)
MCH: 30.4 pg (ref 26.0–34.0)
MCHC: 32.5 g/dL (ref 30.0–36.0)
MCV: 93.5 fL (ref 80.0–100.0)
Monocytes Absolute: 0.7 10*3/uL (ref 0.1–1.0)
Monocytes Relative: 9 %
Neutro Abs: 5.8 10*3/uL (ref 1.7–7.7)
Neutrophils Relative %: 71 %
Platelets: 194 10*3/uL (ref 150–400)
RBC: 4.34 MIL/uL (ref 3.87–5.11)
RDW: 14.2 % (ref 11.5–15.5)
WBC: 8 10*3/uL (ref 4.0–10.5)
nRBC: 0 % (ref 0.0–0.2)

## 2019-12-09 LAB — COMPREHENSIVE METABOLIC PANEL
ALT: 18 U/L (ref 0–44)
AST: 19 U/L (ref 15–41)
Albumin: 4.3 g/dL (ref 3.5–5.0)
Alkaline Phosphatase: 39 U/L (ref 38–126)
Anion gap: 11 (ref 5–15)
BUN: 32 mg/dL — ABNORMAL HIGH (ref 8–23)
CO2: 26 mmol/L (ref 22–32)
Calcium: 9.4 mg/dL (ref 8.9–10.3)
Chloride: 101 mmol/L (ref 98–111)
Creatinine, Ser: 1.25 mg/dL — ABNORMAL HIGH (ref 0.44–1.00)
GFR calc Af Amer: 48 mL/min — ABNORMAL LOW (ref >60)
GFR calc non Af Amer: 41 mL/min — ABNORMAL LOW (ref >60)
Glucose, Bld: 80 mg/dL (ref 70–99)
Potassium: 3.7 mmol/L (ref 3.5–5.1)
Sodium: 138 mmol/L (ref 135–145)
Total Bilirubin: 1.1 mg/dL (ref 0.3–1.2)
Total Protein: 7 g/dL (ref 6.5–8.1)

## 2019-12-09 LAB — VITAMIN B12: Vitamin B-12: 207 pg/mL (ref 180–914)

## 2019-12-09 LAB — VITAMIN D 25 HYDROXY (VIT D DEFICIENCY, FRACTURES): Vit D, 25-Hydroxy: 20.3 ng/mL — ABNORMAL LOW (ref 30–100)

## 2019-12-09 LAB — FOLATE: Folate: 11.5 ng/mL (ref >5.9)

## 2019-12-09 LAB — IRON AND TIBC
Iron: 123 ug/dL (ref 28–170)
Saturation Ratios: 34 % — ABNORMAL HIGH (ref 10.4–31.8)
TIBC: 358 ug/dL (ref 250–450)
UIBC: 235 ug/dL

## 2019-12-09 LAB — FERRITIN: Ferritin: 82 ng/mL (ref 11–307)

## 2019-12-09 LAB — LACTATE DEHYDROGENASE: LDH: 145 U/L (ref 98–192)

## 2019-12-09 NOTE — Progress Notes (Signed)
Orders placed for today's labs

## 2019-12-15 ENCOUNTER — Other Ambulatory Visit: Payer: Self-pay

## 2019-12-15 ENCOUNTER — Encounter (HOSPITAL_COMMUNITY): Payer: Self-pay | Admitting: Nurse Practitioner

## 2019-12-15 ENCOUNTER — Inpatient Hospital Stay (HOSPITAL_COMMUNITY): Payer: Medicare Other | Attending: Nurse Practitioner | Admitting: Nurse Practitioner

## 2019-12-15 VITALS — BP 144/69 | HR 66 | Temp 97.1°F | Resp 17 | Wt 112.8 lb

## 2019-12-15 DIAGNOSIS — Z1231 Encounter for screening mammogram for malignant neoplasm of breast: Secondary | ICD-10-CM | POA: Diagnosis not present

## 2019-12-15 DIAGNOSIS — E559 Vitamin D deficiency, unspecified: Secondary | ICD-10-CM | POA: Diagnosis not present

## 2019-12-15 DIAGNOSIS — C50911 Malignant neoplasm of unspecified site of right female breast: Secondary | ICD-10-CM

## 2019-12-15 DIAGNOSIS — Z9221 Personal history of antineoplastic chemotherapy: Secondary | ICD-10-CM | POA: Diagnosis not present

## 2019-12-15 DIAGNOSIS — Z853 Personal history of malignant neoplasm of breast: Secondary | ICD-10-CM | POA: Insufficient documentation

## 2019-12-15 DIAGNOSIS — I10 Essential (primary) hypertension: Secondary | ICD-10-CM | POA: Insufficient documentation

## 2019-12-15 DIAGNOSIS — Z85038 Personal history of other malignant neoplasm of large intestine: Secondary | ICD-10-CM | POA: Diagnosis not present

## 2019-12-15 DIAGNOSIS — Z79899 Other long term (current) drug therapy: Secondary | ICD-10-CM | POA: Diagnosis not present

## 2019-12-15 DIAGNOSIS — M818 Other osteoporosis without current pathological fracture: Secondary | ICD-10-CM | POA: Insufficient documentation

## 2019-12-15 DIAGNOSIS — Z923 Personal history of irradiation: Secondary | ICD-10-CM | POA: Insufficient documentation

## 2019-12-15 NOTE — Patient Instructions (Signed)
[facility] at [facility] Discharge Instructions  You saw Randi Lockamy, NP, today.   Thank you for choosing [facility] at [facility] to provide your oncology and hematology care.  To afford each patient quality time with our provider, please arrive at least 15 minutes before your scheduled appointment time.   If you have a lab appointment with the Cancer Center please come in thru the Main Entrance and check in at the main information desk.  You need to re-schedule your appointment should you arrive 10 or more minutes late.  We strive to give you quality time with our providers, and arriving late affects you and other patients whose appointments are after yours.  Also, if you no show three or more times for appointments you may be dismissed from the clinic at the providers discretion.     Again, thank you for choosing [facility].  Our hope is that these requests will decrease the amount of time that you wait before being seen by our physicians.       _____________________________________________________________  Should you have questions after your visit to [facility], please contact our office at [phone] and follow the prompts.  Our office hours are 8:00 a.m. and 4:30 p.m. Monday - Friday.  Please note that voicemails left after 4:00 p.m. may not be returned until the following business day.  We are closed weekends and major holidays.  You do have access to a nurse 24-7, just call the main number to the clinic [phone] and do not press any options, hold on the line and a nurse will answer the phone.    For prescription refill requests, have your pharmacy contact our office and allow 72 hours.    Due to Covid, you will need to wear a mask upon entering the hospital. If you do not have a mask, a mask will be given to you at the Main Entrance upon arrival. For doctor visits, patients may have 1 support person age  18 or older with them. For treatment visits, patients can not have anyone with them due to social distancing guidelines and our immunocompromised population.     

## 2019-12-15 NOTE — Assessment & Plan Note (Addendum)
1.  Stage III adenocarcinoma the sigmoid colon: - She was diagnosed in 05/2004.  She was treated with segmental resection, followed by adjuvant chemo with Xeloda\oxaliplatin x6 cycles; received Avastin with cycle 1, then Avastin discontinued. -She has been in remission since that time. -Last colonoscopy on 08/31/2013 with Dr. Gala Romney.  He recommended repeat colonoscopy in 5 years (09/2018). -Last CEA 03/2016 was 1.8.  No need to continue CEA monitoring beyond 5 years per NCCN guidelines. -Labs on 12/09/2019 showed labs WNL -She reports she has not scheduled her 5-year colonoscopy with Dr. Gala Romney.  She repeats she is not having any problems and she wishes to wait a little longer however she will contact them within the year. -She will follow-up in 1 year with repeat labs.  2.  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  Treated with neoadjuvant antiestrogen therapy Arimidex x4 months.  Then underwent lumpectomy, followed by adjuvant radiation therapy. -She started on Femara 05/2005 and continued through 05/2007. -She then switched to tamoxifen from 05/2007 through 08/2009. -Mammogram done on 12/09/2019 was BI-RADS Category 1 negative. - Labs on 12/09/2019 showed her creatinine 1.25, WBC 8.0, hemoglobin 13.2, platelets 194, ferritin 82, percent saturation 34 -She will follow-up in 1 year with repeat mammogram and labs.  3.  Osteoporosis: - Patient's last DEXA scan was on 12/03/2018 with a T score of -4.6. -Patient should continue Prolia injections every 6 months. -Patient refuses to take calcium and vitamin D due to bone aches. -She reports she goes to the dentist every 6 months to have her teeth checked.  4.  Vitamin D deficiency: -Labs done on 12/09/2019 showed vitamin D level 20.30 -She will start taking vitamin D daily.  She reports she cannot take vitamin D due to it hurting her stomach.

## 2019-12-15 NOTE — Progress Notes (Signed)
Patricia Kline, [facility] [id number]   CLINIC:  Medical Oncology/Hematology  PCP:  Patricia Kline, [place] [address] [phone]   REASON FOR VISIT: Follow-up for breast cancer   CURRENT THERAPY: Surveillance  BRIEF ONCOLOGIC HISTORY:  Oncology History  Invasive ductal carcinoma of right breast ([place])  07/19/2004 Imaging   PET- increased uptake in posterior aspect of right breast   08/09/2004 Imaging   Mammogram and [place]   08/09/2004 Initial Diagnosis   Invasive ductal carcinoma of right breast on needle core biopsy of right breast   08/22/2004 Imaging   MRI B/L Breast- 1.8 x 1.4 x 1.3 cm right breast mass   08/29/2004 - 12/13/2004 Chemotherapy   Arimidex x 4 months neoadjuvantly- 77% shrinkage by volume size   12/20/2004 Surgery   Right partial mastectomy- 2 cm, ER 89%, PR 10%, Her2 negative, Ki-67 16%.     02/06/2005 - 03/28/2005 Radiation Therapy   By Dr. Elba Barman   05/15/2005 - 05/06/2007 Chemotherapy   Femara   08/29/2005 Imaging   Mammogram s/p chemo and resection.  BIRADS 2   05/06/2007 - 09/06/2009 Chemotherapy   Tamoxifen   Adenocarcinoma of sigmoid colon ([place])  05/29/2004 Initial Diagnosis   Adenocarcinoma of sigmoid colon   05/29/2004 Surgery   Segmental resection, 6 cm maximum size, adenocarcinoma, 6/15 lymph nodes positive for disease, T3N2   07/25/2004 - 11/15/2004 Chemotherapy   Xeloda + Oxaliplatin, Avastin x 6 cycles.  Avastin d/c after cycle 1    Remission       CANCER STAGING: Cancer Staging Adenocarcinoma of sigmoid colon ([place]) Staging form: Colon and Rectum, AJCC 7th Edition - Clinical: No stage assigned - Unsigned - Pathologic: No stage assigned - Unsigned  Invasive ductal carcinoma of right breast ([facility]) Staging form: Breast, AJCC 7th Edition - Clinical: Stage IA (T1c, N0, cM0) - Signed by Baird [facility], PA-C on 01/11/2013    INTERVAL HISTORY:  Patricia Kline 78 y.o. female returns for  routine follow-up for breast cancer.  Patient reports she is doing well since her last visit.  She denies any new lumps or bumps present.  She denies any bone pain.  She denies any change in bowel habits.  She denies any bright red bleeding per rectum or melena.  She denies any easy bruising or bleeding. Denies any nausea, vomiting, or diarrhea. Denies any new pains. Had not noticed any recent bleeding such as epistaxis, hematuria or hematochezia. Denies recent chest pain on exertion, shortness of breath on minimal exertion, pre-syncopal episodes, or palpitations. Denies any numbness or tingling in hands or feet. Denies any recent fevers, infections, or recent hospitalizations. Patient reports appetite at 100% and energy level at 50%.  She is eating well maintain her weight at this time.    REVIEW OF SYSTEMS:  Review of Systems  Respiratory: Positive for shortness of breath.   Neurological: Positive for numbness.  Hematological: Bruises/bleeds easily.  All other systems reviewed and are negative.    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 Madolyn Frieze papilla.  Surgical anastomosis at 8-10 cm from the anal verge.  Residual rectal and colonic mucosa appeared normal.    COLONOSCOPY N/A 08/31/2013   normal residual rectum/colon.    Epidural steroid injection with Kenalog     Midline   KNEE SURGERY     LOW ANTERIOR BOWEL RESECTION     PORT-A-CATH REMOVAL  02/04/2012   Procedure: REMOVAL PORT-A-CATH;  Surgeon: Donato Heinz, MD;  Location: [facility];  Service: General;  Laterality: N/A;  minor room   retinal hole repair     bilateral   TONSILLECTOMY     YAG LASER APPLICATION Left 8/36/6294   Procedure: YAG LASER APPLICATION;  Surgeon: Elta Guadeloupe T. Gershon Crane, MD;  Location: [facility];  Service: Ophthalmology;  Laterality: Left;     SOCIAL HISTORY:  Social History   Socioeconomic History   Marital status: Married    Spouse name: Not on file   Number of children: Not on file   Years of education: Not on file   Highest education level: Not on file  Occupational History   Not on file  Tobacco Use   Smoking status: Never Smoker   Smokeless tobacco: Never Used  Substance and Sexual Activity   Alcohol use: No   Drug use: No   Sexual activity: Never  Other Topics Concern   Not on file  Social History Narrative   Not on file   Social Determinants of Health   Financial Resource Strain:    Difficulty of Paying Living Expenses: Not on file  Food Insecurity:    Worried About [place] in the Last Year: Not on file   Ran Out of Food in the Last Year: Not on file  Transportation Needs:    Lack of Transportation (Medical): Not on file   Lack of Transportation (Non-Medical): Not on file  Physical Activity:    Days of Exercise per Week: Not on file   Minutes of Exercise per Session: Not on file  Stress:    Feeling of Stress : Not on file  Social Connections:    Frequency of Communication with Friends and Family: Not on file   Frequency of Social Gatherings with Friends and Family: Not on file   Attends Religious Services: Not on file   Active Member of Clubs or Organizations: Not on file   Attends Theatre manager Meetings: Not on file   Marital Status: Not on file  Intimate Partner Violence:    Fear of Current or Ex-Partner: Not on file   Emotionally Abused: Not on file   Physically Abused: Not on file   Sexually Abused: Not on file    FAMILY HISTORY:  Family History  Problem Relation Age of Onset   Stroke Mother    Cancer Mother        breast  cancer   Heart attack Father    Colon cancer Neg Hx     CURRENT MEDICATIONS:  Outpatient Encounter Medications as of 12/15/2019  Medication Sig Note   acetaminophen (TYLENOL) 325 MG tablet Take 650 mg by  mouth as needed for mild pain.     denosumab (PROLIA) 60 MG/ML SOLN injection Inject 60 mg into the skin every 6 (six) months. Administer in upper arm, thigh, or abdomen 01/12/2014: Due in December 2015   gabapentin (NEURONTIN) 300 MG capsule Take 1 cap at breakfast. Take 2 cap at lunch. Take 2 cap at dinner. Take 1 cap at bedtime. (Patient taking differently: Take 1 cap at breakfast. Take 1 cap at lunch. Take 1 cap at dinner. Take 1 cap at bedtime.)    lisinopril-hydrochlorothiazide (PRINZIDE,ZESTORETIC) 20-12.5 MG per tablet Take 1 tablet by mouth daily. Reported on 09/06/2015    LORazepam (ATIVAN) 1 MG tablet Take 1 mg by mouth as needed. Takes 1 tablet daily and then may take more if needed during the day    No facility-administered encounter medications on file as of 12/15/2019.    ALLERGIES:  Allergies  Allergen Reactions   Penicillins      PHYSICAL EXAM:  ECOG Performance status: 1  Vitals:   12/15/19 1144  BP: (!) 144/69  Pulse: 66  Resp: 17  Temp: (!) 97.1 F (36.2 C)  SpO2: 100%   Filed Weights   12/15/19 1144  Weight: 112 lb 12.8 oz (51.2 kg)   Physical Exam Constitutional:      Appearance: Normal appearance. She is normal weight.  Cardiovascular:     Rate and Rhythm: Normal rate and regular rhythm.     Heart sounds: Normal heart sounds.  Pulmonary:     Effort: Pulmonary effort is normal.       Breath sounds: Normal breath sounds.  Abdominal:     General: Bowel sounds are normal.     Palpations: Abdomen is soft.  Musculoskeletal:        General: Normal range of motion.  Skin:    General: Skin is warm.  Neurological:     Mental Status: She is alert and oriented to person, place, and time. Mental status is at baseline.  Psychiatric:        Mood and Affect: Mood normal.        Behavior: Behavior normal.        Thought Content: Thought content normal.        Judgment: Judgment normal.      LABORATORY DATA:  I have reviewed the labs as listed.  CBC    Component Value Date/Time   WBC 8.0 12/09/2019 0930   RBC 4.34 12/09/2019 0930   HGB 13.2 12/09/2019 0930   HCT 40.6 12/09/2019 0930   PLT 194 12/09/2019 0930   MCV 93.5 12/09/2019 0930   MCH 30.4 12/09/2019 0930   MCHC 32.5 12/09/2019 0930   RDW 14.2 12/09/2019 0930   LYMPHSABS 1.3 12/09/2019 0930   MONOABS 0.7 12/09/2019 0930   EOSABS 0.1 12/09/2019 0930   BASOSABS 0.0 12/09/2019 0930   CMP Latest Ref Rng & Units 12/09/2019 09/28/2019 03/25/2019  Glucose 70 - 99 mg/dL 80 109(H) 98  BUN 8 - 23 mg/dL 32(H) 19 22  Creatinine 0.44 - 1.00 mg/dL 1.25(H) 1.19(H) 1.33(H)  Sodium 135 - 145 mmol/L 138 137 138  Potassium 3.5 - 5.1 mmol/L 3.7 4.3 4.4  Chloride 98 - 111 mmol/L 101 100 99  CO2 22 - 32 mmol/L 26 28 26   Calcium 8.9 - 10.3 mg/dL 9.4 9.1 9.7  Total Protein 6.5 - 8.1 g/dL 7.0 6.7 7.3  Total Bilirubin 0.3 - 1.2 mg/dL 1.1 0.6 0.6  Alkaline Phos 38 - 126 U/L 39 42 44  AST 15 - 41 U/L 19 20 16   ALT 0 - 44 U/L 18 12 13     All questions were answered to patient's stated satisfaction. Encouraged patient to call with any new concerns or questions before his next visit to the cancer center and we can certain see him sooner, if needed.     ASSESSMENT & PLAN:  Invasive ductal carcinoma of right breast 1.  Stage III adenocarcinoma the sigmoid colon: - She was diagnosed in 05/2004.  She was treated with segmental  resection, followed by adjuvant chemo with Xeloda\oxaliplatin x6 cycles; received Avastin with cycle 1, then Avastin discontinued. -She has been in remission since that time. -Last colonoscopy on 08/31/2013 with Dr. Gala Romney.  He recommended repeat colonoscopy in 5 years (09/2018). -Last CEA 03/2016 was 1.8.  No need to continue CEA monitoring beyond 5 years per NCCN guidelines. -Labs on 12/09/2019 showed labs WNL -She reports she has not scheduled her 5-year colonoscopy with Dr. Gala Romney.  She repeats she is not having any problems and she wishes to wait a little longer however she will contact them within the year. -She will follow-up in 1 year with repeat labs.  2.  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  Treated with neoadjuvant antiestrogen therapy Arimidex x4 months.  Then underwent lumpectomy, followed by adjuvant radiation therapy. -She started on Femara 05/2005 and continued through 05/2007. -She then switched to tamoxifen from 05/2007 through 08/2009. -Mammogram done on 12/09/2019 was BI-RADS Category 1 negative. - Labs on 12/09/2019 showed her creatinine 1.25, WBC 8.0, hemoglobin 13.2, platelets 194, ferritin 82, percent saturation 34 -She will follow-up in 1 year with repeat mammogram and labs.  3.  Osteoporosis: - Patient's last DEXA scan was on 12/03/2018 with a T score of -4.6. -Patient should continue Prolia injections every 6 months. -Patient refuses to take calcium and vitamin D due to bone aches. -She reports she goes to the dentist every 6 months to have her teeth checked.  4.  Vitamin D deficiency: -Labs done on 12/09/2019 showed vitamin D level 20.30 -She will start taking vitamin D daily.  She reports she cannot take vitamin D due to it hurting her stomach.     Orders placed this encounter:  Orders Placed This Encounter  Procedures   MM 3D SCREEN BREAST BILATERAL   Lactate dehydrogenase   Vitamin B12   VITAMIN D 25 Hydroxy (Vit-D  Deficiency, Fractures)   CBC with Differential/Platelet   Comprehensive metabolic panel   Folate      Francene Finders, FNP-C [place] [phone]

## 2019-12-29 ENCOUNTER — Encounter (HOSPITAL_COMMUNITY): Payer: Medicare Other

## 2019-12-29 DIAGNOSIS — Z681 Body mass index (BMI) 19 or less, adult: Secondary | ICD-10-CM | POA: Diagnosis not present

## 2019-12-29 DIAGNOSIS — S0991XA Unspecified injury of ear, initial encounter: Secondary | ICD-10-CM | POA: Diagnosis not present

## 2019-12-29 DIAGNOSIS — Z23 Encounter for immunization: Secondary | ICD-10-CM | POA: Diagnosis not present

## 2019-12-31 DIAGNOSIS — N1831 Chronic kidney disease, stage 3a: Secondary | ICD-10-CM | POA: Diagnosis not present

## 2019-12-31 DIAGNOSIS — G620 Drug-induced polyneuropathy: Secondary | ICD-10-CM | POA: Diagnosis not present

## 2019-12-31 DIAGNOSIS — T451X1S Poisoning by antineoplastic and immunosuppressive drugs, accidental (unintentional), sequela: Secondary | ICD-10-CM | POA: Diagnosis not present

## 2019-12-31 DIAGNOSIS — I129 Hypertensive chronic kidney disease with stage 1 through stage 4 chronic kidney disease, or unspecified chronic kidney disease: Secondary | ICD-10-CM | POA: Diagnosis not present

## 2020-01-22 DIAGNOSIS — H838X3 Other specified diseases of inner ear, bilateral: Secondary | ICD-10-CM | POA: Diagnosis not present

## 2020-01-22 DIAGNOSIS — H9311 Tinnitus, right ear: Secondary | ICD-10-CM | POA: Diagnosis not present

## 2020-01-22 DIAGNOSIS — H903 Sensorineural hearing loss, bilateral: Secondary | ICD-10-CM | POA: Diagnosis not present

## 2020-01-27 DIAGNOSIS — Z23 Encounter for immunization: Secondary | ICD-10-CM | POA: Diagnosis not present

## 2020-01-30 DIAGNOSIS — G620 Drug-induced polyneuropathy: Secondary | ICD-10-CM | POA: Diagnosis not present

## 2020-01-30 DIAGNOSIS — T451X1S Poisoning by antineoplastic and immunosuppressive drugs, accidental (unintentional), sequela: Secondary | ICD-10-CM | POA: Diagnosis not present

## 2020-01-30 DIAGNOSIS — N1831 Chronic kidney disease, stage 3a: Secondary | ICD-10-CM | POA: Diagnosis not present

## 2020-01-30 DIAGNOSIS — I129 Hypertensive chronic kidney disease with stage 1 through stage 4 chronic kidney disease, or unspecified chronic kidney disease: Secondary | ICD-10-CM | POA: Diagnosis not present

## 2020-02-02 DIAGNOSIS — R7309 Other abnormal glucose: Secondary | ICD-10-CM | POA: Diagnosis not present

## 2020-02-02 DIAGNOSIS — Z0001 Encounter for general adult medical examination with abnormal findings: Secondary | ICD-10-CM | POA: Diagnosis not present

## 2020-02-02 DIAGNOSIS — E785 Hyperlipidemia, unspecified: Secondary | ICD-10-CM | POA: Diagnosis not present

## 2020-02-02 DIAGNOSIS — M549 Dorsalgia, unspecified: Secondary | ICD-10-CM | POA: Diagnosis not present

## 2020-02-02 DIAGNOSIS — S0991XA Unspecified injury of ear, initial encounter: Secondary | ICD-10-CM | POA: Diagnosis not present

## 2020-02-02 DIAGNOSIS — Z1389 Encounter for screening for other disorder: Secondary | ICD-10-CM | POA: Diagnosis not present

## 2020-02-02 DIAGNOSIS — N183 Chronic kidney disease, stage 3 unspecified: Secondary | ICD-10-CM | POA: Diagnosis not present

## 2020-02-02 DIAGNOSIS — Z1331 Encounter for screening for depression: Secondary | ICD-10-CM | POA: Diagnosis not present

## 2020-02-02 DIAGNOSIS — Z6820 Body mass index (BMI) 20.0-20.9, adult: Secondary | ICD-10-CM | POA: Diagnosis not present

## 2020-02-02 DIAGNOSIS — N390 Urinary tract infection, site not specified: Secondary | ICD-10-CM | POA: Diagnosis not present

## 2020-02-02 DIAGNOSIS — I10 Essential (primary) hypertension: Secondary | ICD-10-CM | POA: Diagnosis not present

## 2020-03-01 DIAGNOSIS — N1831 Chronic kidney disease, stage 3a: Secondary | ICD-10-CM | POA: Diagnosis not present

## 2020-03-01 DIAGNOSIS — G620 Drug-induced polyneuropathy: Secondary | ICD-10-CM | POA: Diagnosis not present

## 2020-03-01 DIAGNOSIS — I129 Hypertensive chronic kidney disease with stage 1 through stage 4 chronic kidney disease, or unspecified chronic kidney disease: Secondary | ICD-10-CM | POA: Diagnosis not present

## 2020-03-01 DIAGNOSIS — T451X1S Poisoning by antineoplastic and immunosuppressive drugs, accidental (unintentional), sequela: Secondary | ICD-10-CM | POA: Diagnosis not present

## 2020-03-07 DIAGNOSIS — H353131 Nonexudative age-related macular degeneration, bilateral, early dry stage: Secondary | ICD-10-CM | POA: Diagnosis not present

## 2020-03-07 DIAGNOSIS — Z961 Presence of intraocular lens: Secondary | ICD-10-CM | POA: Diagnosis not present

## 2020-03-28 ENCOUNTER — Other Ambulatory Visit (HOSPITAL_COMMUNITY): Payer: Self-pay | Admitting: Surgery

## 2020-03-28 DIAGNOSIS — C50911 Malignant neoplasm of unspecified site of right female breast: Secondary | ICD-10-CM

## 2020-03-29 ENCOUNTER — Other Ambulatory Visit: Payer: Self-pay

## 2020-03-29 ENCOUNTER — Inpatient Hospital Stay (HOSPITAL_COMMUNITY): Payer: Medicare Other

## 2020-03-29 ENCOUNTER — Inpatient Hospital Stay (HOSPITAL_COMMUNITY): Payer: Medicare Other | Attending: Hematology

## 2020-03-29 VITALS — BP 136/50 | HR 51 | Temp 96.7°F | Resp 16

## 2020-03-29 DIAGNOSIS — Z853 Personal history of malignant neoplasm of breast: Secondary | ICD-10-CM | POA: Diagnosis not present

## 2020-03-29 DIAGNOSIS — M810 Age-related osteoporosis without current pathological fracture: Secondary | ICD-10-CM

## 2020-03-29 DIAGNOSIS — C50911 Malignant neoplasm of unspecified site of right female breast: Secondary | ICD-10-CM

## 2020-03-29 DIAGNOSIS — M818 Other osteoporosis without current pathological fracture: Secondary | ICD-10-CM | POA: Diagnosis not present

## 2020-03-29 LAB — COMPREHENSIVE METABOLIC PANEL
ALT: 12 U/L (ref 0–44)
AST: 14 U/L — ABNORMAL LOW (ref 15–41)
Albumin: 4.3 g/dL (ref 3.5–5.0)
Alkaline Phosphatase: 37 U/L — ABNORMAL LOW (ref 38–126)
Anion gap: 8 (ref 5–15)
BUN: 28 mg/dL — ABNORMAL HIGH (ref 8–23)
CO2: 25 mmol/L (ref 22–32)
Calcium: 9.1 mg/dL (ref 8.9–10.3)
Chloride: 103 mmol/L (ref 98–111)
Creatinine, Ser: 1.25 mg/dL — ABNORMAL HIGH (ref 0.44–1.00)
GFR, Estimated: 44 mL/min — ABNORMAL LOW (ref >60)
Glucose, Bld: 89 mg/dL (ref 70–99)
Potassium: 3.7 mmol/L (ref 3.5–5.1)
Sodium: 136 mmol/L (ref 135–145)
Total Bilirubin: 0.6 mg/dL (ref 0.3–1.2)
Total Protein: 6.9 g/dL (ref 6.5–8.1)

## 2020-03-29 MED ORDER — DENOSUMAB 60 MG/ML ~~LOC~~ SOSY
60.0000 mg | PREFILLED_SYRINGE | Freq: Once | SUBCUTANEOUS | Status: AC
  Administered 2020-03-29: 60 mg via SUBCUTANEOUS
  Filled 2020-03-29: qty 1

## 2020-03-29 NOTE — Progress Notes (Signed)
Patient tolerated Prolia injection with no complaints voiced.  Site clean and dry with no bruising or swelling noted.  No complaints of pain.  Discharged with vital signs stable and no signs or symptoms of distress noted.  °

## 2020-04-01 DIAGNOSIS — I129 Hypertensive chronic kidney disease with stage 1 through stage 4 chronic kidney disease, or unspecified chronic kidney disease: Secondary | ICD-10-CM | POA: Diagnosis not present

## 2020-04-01 DIAGNOSIS — G620 Drug-induced polyneuropathy: Secondary | ICD-10-CM | POA: Diagnosis not present

## 2020-04-01 DIAGNOSIS — N1831 Chronic kidney disease, stage 3a: Secondary | ICD-10-CM | POA: Diagnosis not present

## 2020-04-01 DIAGNOSIS — T451X1S Poisoning by antineoplastic and immunosuppressive drugs, accidental (unintentional), sequela: Secondary | ICD-10-CM | POA: Diagnosis not present

## 2020-06-29 DIAGNOSIS — N1831 Chronic kidney disease, stage 3a: Secondary | ICD-10-CM | POA: Diagnosis not present

## 2020-06-29 DIAGNOSIS — I129 Hypertensive chronic kidney disease with stage 1 through stage 4 chronic kidney disease, or unspecified chronic kidney disease: Secondary | ICD-10-CM | POA: Diagnosis not present

## 2020-07-22 DIAGNOSIS — H903 Sensorineural hearing loss, bilateral: Secondary | ICD-10-CM | POA: Diagnosis not present

## 2020-07-22 DIAGNOSIS — H9311 Tinnitus, right ear: Secondary | ICD-10-CM | POA: Diagnosis not present

## 2020-07-22 DIAGNOSIS — H838X3 Other specified diseases of inner ear, bilateral: Secondary | ICD-10-CM | POA: Diagnosis not present

## 2020-07-30 DIAGNOSIS — N1831 Chronic kidney disease, stage 3a: Secondary | ICD-10-CM | POA: Diagnosis not present

## 2020-07-30 DIAGNOSIS — I129 Hypertensive chronic kidney disease with stage 1 through stage 4 chronic kidney disease, or unspecified chronic kidney disease: Secondary | ICD-10-CM | POA: Diagnosis not present

## 2020-09-28 ENCOUNTER — Other Ambulatory Visit (HOSPITAL_COMMUNITY): Payer: Medicare Other

## 2020-09-28 ENCOUNTER — Ambulatory Visit (HOSPITAL_COMMUNITY): Payer: Medicare Other

## 2020-09-29 DIAGNOSIS — I129 Hypertensive chronic kidney disease with stage 1 through stage 4 chronic kidney disease, or unspecified chronic kidney disease: Secondary | ICD-10-CM | POA: Diagnosis not present

## 2020-09-29 DIAGNOSIS — N1831 Chronic kidney disease, stage 3a: Secondary | ICD-10-CM | POA: Diagnosis not present

## 2020-10-04 ENCOUNTER — Other Ambulatory Visit (HOSPITAL_COMMUNITY): Payer: Self-pay | Admitting: *Deleted

## 2020-10-04 DIAGNOSIS — M810 Age-related osteoporosis without current pathological fracture: Secondary | ICD-10-CM

## 2020-10-05 ENCOUNTER — Other Ambulatory Visit: Payer: Self-pay

## 2020-10-05 ENCOUNTER — Inpatient Hospital Stay (HOSPITAL_COMMUNITY): Payer: Medicare Other

## 2020-10-05 ENCOUNTER — Inpatient Hospital Stay (HOSPITAL_COMMUNITY): Payer: Medicare Other | Attending: Hematology

## 2020-10-05 ENCOUNTER — Encounter (HOSPITAL_COMMUNITY): Payer: Self-pay

## 2020-10-05 VITALS — BP 144/45 | HR 51 | Temp 96.7°F | Resp 16 | Wt 114.0 lb

## 2020-10-05 DIAGNOSIS — Z853 Personal history of malignant neoplasm of breast: Secondary | ICD-10-CM | POA: Diagnosis not present

## 2020-10-05 DIAGNOSIS — M810 Age-related osteoporosis without current pathological fracture: Secondary | ICD-10-CM | POA: Insufficient documentation

## 2020-10-05 LAB — COMPREHENSIVE METABOLIC PANEL
ALT: 14 U/L (ref 0–44)
AST: 18 U/L (ref 15–41)
Albumin: 4.1 g/dL (ref 3.5–5.0)
Alkaline Phosphatase: 47 U/L (ref 38–126)
Anion gap: 8 (ref 5–15)
BUN: 20 mg/dL (ref 8–23)
CO2: 28 mmol/L (ref 22–32)
Calcium: 9.2 mg/dL (ref 8.9–10.3)
Chloride: 101 mmol/L (ref 98–111)
Creatinine, Ser: 1.1 mg/dL — ABNORMAL HIGH (ref 0.44–1.00)
GFR, Estimated: 51 mL/min — ABNORMAL LOW (ref >60)
Glucose, Bld: 106 mg/dL — ABNORMAL HIGH (ref 70–99)
Potassium: 3.7 mmol/L (ref 3.5–5.1)
Sodium: 137 mmol/L (ref 135–145)
Total Bilirubin: 0.6 mg/dL (ref 0.3–1.2)
Total Protein: 6.5 g/dL (ref 6.5–8.1)

## 2020-10-05 MED ORDER — DENOSUMAB 60 MG/ML ~~LOC~~ SOSY
60.0000 mg | PREFILLED_SYRINGE | Freq: Once | SUBCUTANEOUS | Status: AC
Start: 2020-10-05 — End: 2020-10-05
  Administered 2020-10-05: 60 mg via SUBCUTANEOUS
  Filled 2020-10-05: qty 1

## 2020-10-05 NOTE — Progress Notes (Signed)
Patient presents today for prolia injection. States she does not take any Vitamin D or Calcium supplements at home due to them making her ache and upsetting her stomach. Calcium today 9.2  Patient tolerated injection with no complaints voiced. Site clean and dry with no bruising or swelling noted at site. See MAR for details. Band aid applied.  Patient stable during and after injection. VSS with discharge and left in satisfactory condition with no s/s of distress noted.

## 2020-10-05 NOTE — Patient Instructions (Signed)
[facility]  Discharge Instructions: ?Thank you for choosing [facility] to provide your oncology and hematology care.  ?If you have a lab appointment with the Cancer Center, please come in thru the Main Entrance and check in at the main information desk. ? ?Wear comfortable clothing and clothing appropriate for easy access to any Portacath or PICC line.  ? ?We strive to give you quality time with your provider. You may need to reschedule your appointment if you arrive late (15 or more minutes).  Arriving late affects you and other patients whose appointments are after yours.  Also, if you miss three or more appointments without notifying the office, you may be dismissed from the clinic at the provider?s discretion.    ?  ?For prescription refill requests, have your pharmacy contact our office and allow 72 hours for refills to be completed.   ? ?Today you received the following chemotherapy and/or immunotherapy agents Prolia    ?  ?To help prevent nausea and vomiting after your treatment, we encourage you to take your nausea medication as directed. ? ?BELOW ARE SYMPTOMS THAT SHOULD BE REPORTED IMMEDIATELY: ?*FEVER GREATER THAN 100.4 F (38 ?C) OR HIGHER ?*CHILLS OR SWEATING ?*NAUSEA AND VOMITING THAT IS NOT CONTROLLED WITH YOUR NAUSEA MEDICATION ?*UNUSUAL SHORTNESS OF BREATH ?*UNUSUAL BRUISING OR BLEEDING ?*URINARY PROBLEMS (pain or burning when urinating, or frequent urination) ?*BOWEL PROBLEMS (unusual diarrhea, constipation, pain near the anus) ?TENDERNESS IN MOUTH AND THROAT WITH OR WITHOUT PRESENCE OF ULCERS (sore throat, sores in mouth, or a toothache) ?UNUSUAL RASH, SWELLING OR PAIN  ?UNUSUAL VAGINAL DISCHARGE OR ITCHING  ? ?Items with * indicate a potential emergency and should be followed up as soon as possible or go to the Emergency Department if any problems should occur. ? ?Please show the CHEMOTHERAPY ALERT CARD or IMMUNOTHERAPY ALERT CARD at check-in to the Emergency  Department and triage nurse. ? ?Should you have questions after your visit or need to cancel or reschedule your appointment, please contact [facility] [phone]  and follow the prompts.  Office hours are 8:00 a.m. to 4:30 p.m. Monday - Friday. Please note that voicemails left after 4:00 p.m. may not be returned until the following business day.  We are closed weekends and major holidays. You have access to a nurse at all times for urgent questions. Please call the main number to the clinic [phone] and follow the prompts. ? ?For any non-urgent questions, you may also contact your provider using MyChart. We now offer e-Visits for anyone 18 and older to request care online for non-urgent symptoms. For details visit [email or web address]. ?  ?Also download the MyChart app! Go to the app store, search "MyChart", open the app, select [facility], and log in with your MyChart username and password. ? ?Due to Covid, a mask is required upon entering the hospital/clinic. If you do not have a mask, one will be given to you upon arrival. For doctor visits, patients may have 1 support person aged 18 or older with them. For treatment visits, patients cannot have anyone with them due to current Covid guidelines and our immunocompromised population.  ?

## 2020-10-19 DIAGNOSIS — Z23 Encounter for immunization: Secondary | ICD-10-CM | POA: Diagnosis not present

## 2020-12-06 ENCOUNTER — Other Ambulatory Visit (HOSPITAL_COMMUNITY): Payer: Self-pay | Admitting: Hematology

## 2020-12-06 DIAGNOSIS — Z1231 Encounter for screening mammogram for malignant neoplasm of breast: Secondary | ICD-10-CM

## 2020-12-09 ENCOUNTER — Encounter (HOSPITAL_COMMUNITY): Payer: Self-pay | Admitting: Oncology

## 2020-12-09 ENCOUNTER — Inpatient Hospital Stay (HOSPITAL_COMMUNITY): Payer: Medicare Other | Attending: Hematology

## 2020-12-09 ENCOUNTER — Ambulatory Visit (HOSPITAL_COMMUNITY)
Admission: RE | Admit: 2020-12-09 | Discharge: 2020-12-09 | Disposition: A | Discharge status: Home / Self Care | Payer: Medicare Other | Source: Ambulatory Visit | Attending: Nurse Practitioner | Admitting: Nurse Practitioner

## 2020-12-09 ENCOUNTER — Other Ambulatory Visit: Payer: Self-pay

## 2020-12-09 DIAGNOSIS — M810 Age-related osteoporosis without current pathological fracture: Secondary | ICD-10-CM | POA: Diagnosis not present

## 2020-12-09 DIAGNOSIS — Z9221 Personal history of antineoplastic chemotherapy: Secondary | ICD-10-CM | POA: Diagnosis not present

## 2020-12-09 DIAGNOSIS — Z853 Personal history of malignant neoplasm of breast: Secondary | ICD-10-CM | POA: Insufficient documentation

## 2020-12-09 DIAGNOSIS — Z923 Personal history of irradiation: Secondary | ICD-10-CM | POA: Diagnosis not present

## 2020-12-09 DIAGNOSIS — Z85038 Personal history of other malignant neoplasm of large intestine: Secondary | ICD-10-CM | POA: Insufficient documentation

## 2020-12-09 DIAGNOSIS — Z1231 Encounter for screening mammogram for malignant neoplasm of breast: Secondary | ICD-10-CM | POA: Insufficient documentation

## 2020-12-09 DIAGNOSIS — C50911 Malignant neoplasm of unspecified site of right female breast: Secondary | ICD-10-CM

## 2020-12-09 LAB — CBC WITH DIFFERENTIAL/PLATELET
Abs Immature Granulocytes: 0.03 10*3/uL (ref 0.00–0.07)
Basophils Absolute: 0 10*3/uL (ref 0.0–0.1)
Basophils Relative: 1 %
Eosinophils Absolute: 0.1 10*3/uL (ref 0.0–0.5)
Eosinophils Relative: 1 %
HCT: 39.3 % (ref 36.0–46.0)
Hemoglobin: 12.9 g/dL (ref 12.0–15.0)
Immature Granulocytes: 1 %
Lymphocytes Relative: 20 %
Lymphs Abs: 1.1 10*3/uL (ref 0.7–4.0)
MCH: 30.7 pg (ref 26.0–34.0)
MCHC: 32.8 g/dL (ref 30.0–36.0)
MCV: 93.6 fL (ref 80.0–100.0)
Monocytes Absolute: 0.6 10*3/uL (ref 0.1–1.0)
Monocytes Relative: 10 %
Neutro Abs: 3.8 10*3/uL (ref 1.7–7.7)
Neutrophils Relative %: 67 %
Platelets: 188 10*3/uL (ref 150–400)
RBC: 4.2 MIL/uL (ref 3.87–5.11)
RDW: 13.8 % (ref 11.5–15.5)
WBC: 5.6 10*3/uL (ref 4.0–10.5)
nRBC: 0 % (ref 0.0–0.2)

## 2020-12-09 LAB — COMPREHENSIVE METABOLIC PANEL
ALT: 11 U/L (ref 0–44)
AST: 16 U/L (ref 15–41)
Albumin: 4.5 g/dL (ref 3.5–5.0)
Alkaline Phosphatase: 38 U/L (ref 38–126)
Anion gap: 8 (ref 5–15)
BUN: 20 mg/dL (ref 8–23)
CO2: 27 mmol/L (ref 22–32)
Calcium: 9.2 mg/dL (ref 8.9–10.3)
Chloride: 103 mmol/L (ref 98–111)
Creatinine, Ser: 1.25 mg/dL — ABNORMAL HIGH (ref 0.44–1.00)
GFR, Estimated: 44 mL/min — ABNORMAL LOW (ref >60)
Glucose, Bld: 88 mg/dL (ref 70–99)
Potassium: 4 mmol/L (ref 3.5–5.1)
Sodium: 138 mmol/L (ref 135–145)
Total Bilirubin: 0.4 mg/dL (ref 0.3–1.2)
Total Protein: 7.1 g/dL (ref 6.5–8.1)

## 2020-12-09 LAB — VITAMIN B12: Vitamin B-12: 229 pg/mL (ref 180–914)

## 2020-12-09 LAB — VITAMIN D 25 HYDROXY (VIT D DEFICIENCY, FRACTURES): Vit D, 25-Hydroxy: 19.73 ng/mL — ABNORMAL LOW (ref 30–100)

## 2020-12-09 LAB — LACTATE DEHYDROGENASE: LDH: 139 U/L (ref 98–192)

## 2020-12-09 LAB — FOLATE: Folate: 16.7 ng/mL (ref >5.9)

## 2020-12-15 ENCOUNTER — Inpatient Hospital Stay (HOSPITAL_BASED_OUTPATIENT_CLINIC_OR_DEPARTMENT_OTHER): Payer: Medicare Other | Admitting: Hematology

## 2020-12-15 ENCOUNTER — Other Ambulatory Visit: Payer: Self-pay

## 2020-12-15 VITALS — BP 168/73 | HR 60 | Temp 96.9°F | Resp 18 | Wt 112.0 lb

## 2020-12-15 DIAGNOSIS — E559 Vitamin D deficiency, unspecified: Secondary | ICD-10-CM | POA: Diagnosis not present

## 2020-12-15 DIAGNOSIS — D649 Anemia, unspecified: Secondary | ICD-10-CM | POA: Diagnosis not present

## 2020-12-15 DIAGNOSIS — E538 Deficiency of other specified B group vitamins: Secondary | ICD-10-CM | POA: Diagnosis not present

## 2020-12-15 DIAGNOSIS — D529 Folate deficiency anemia, unspecified: Secondary | ICD-10-CM | POA: Diagnosis not present

## 2020-12-15 DIAGNOSIS — Z853 Personal history of malignant neoplasm of breast: Secondary | ICD-10-CM | POA: Diagnosis not present

## 2020-12-15 DIAGNOSIS — Z85038 Personal history of other malignant neoplasm of large intestine: Secondary | ICD-10-CM | POA: Diagnosis not present

## 2020-12-15 DIAGNOSIS — M810 Age-related osteoporosis without current pathological fracture: Secondary | ICD-10-CM | POA: Diagnosis not present

## 2020-12-15 DIAGNOSIS — C50911 Malignant neoplasm of unspecified site of right female breast: Secondary | ICD-10-CM

## 2020-12-15 DIAGNOSIS — Z1231 Encounter for screening mammogram for malignant neoplasm of breast: Secondary | ICD-10-CM | POA: Diagnosis not present

## 2020-12-15 DIAGNOSIS — Z923 Personal history of irradiation: Secondary | ICD-10-CM | POA: Diagnosis not present

## 2020-12-15 DIAGNOSIS — Z9221 Personal history of antineoplastic chemotherapy: Secondary | ICD-10-CM | POA: Diagnosis not present

## 2020-12-15 NOTE — Progress Notes (Signed)
[facility] [address] [place] [address]   Patient Care Team: Sharilyn Sites, MD as PCP - General (Family Medicine) Gala Romney Cristopher Estimable, MD as Consulting Physician (Gastroenterology)  SUMMARY OF ONCOLOGIC HISTORY: Oncology History  Invasive ductal carcinoma of right breast ([place])  07/19/2004 Imaging   PET- increased uptake in posterior aspect of right breast   08/09/2004 Imaging   Mammogram and [place]   08/09/2004 Initial Diagnosis   Invasive ductal carcinoma of right breast on needle core biopsy of right breast   08/22/2004 Imaging   MRI B/L Breast- 1.8 x 1.4 x 1.3 cm right breast mass   08/29/2004 - 12/13/2004 Chemotherapy   Arimidex x 4 months neoadjuvantly- 77% shrinkage by volume size   12/20/2004 Surgery   Right partial mastectomy- 2 cm, ER 89%, PR 10%, Her2 negative, Ki-67 16%.     02/06/2005 - 03/28/2005 Radiation Therapy   By Dr. Elba Barman   05/15/2005 - 05/06/2007 Chemotherapy   Femara   08/29/2005 Imaging   Mammogram s/p chemo and resection.  BIRADS 2   05/06/2007 - 09/06/2009 Chemotherapy   Tamoxifen   Adenocarcinoma of sigmoid colon ([place])  05/29/2004 Initial Diagnosis   Adenocarcinoma of sigmoid colon   05/29/2004 Surgery   Segmental resection, 6 cm maximum size, adenocarcinoma, 6/15 lymph nodes positive for disease, T3N2   07/25/2004 - 11/15/2004 Chemotherapy   Xeloda + Oxaliplatin, Avastin x 6 cycles.  Avastin d/c after cycle 1    Remission       CHIEF COMPLIANT: Follow-up of right breast cancer and colon cancer   INTERVAL HISTORY: [name] is a 80 y.o. female here today for follow up of her right breast cancer and colon cancer. Her last visit was on 12/15/2019 by Francene Finders, NP-C.   Today she reports feeling good. She is not taking Vitamin D as she reports she does not tolerate them well. She denies hematochezia.   REVIEW OF SYSTEMS:   Review of Systems  Constitutional:  Negative for appetite change and fatigue (75%).  Respiratory:   Positive for shortness of breath.   Gastrointestinal:  Negative for blood in stool.  Neurological:  Positive for numbness (tingling hands and feet).  All other systems reviewed and are negative.  I have reviewed the past medical history, past surgical history, social history and family history with the patient and they are unchanged from previous note.   ALLERGIES:   is allergic to penicillins.   MEDICATIONS:  Current Outpatient Medications  Medication Sig Dispense Refill   acetaminophen (TYLENOL) 325 MG tablet Take 650 mg by mouth as needed for mild pain.      denosumab (PROLIA) 60 MG/ML SOLN injection Inject 60 mg into the skin every 6 (six) months. Administer in upper arm, thigh, or abdomen     gabapentin (NEURONTIN) 300 MG capsule Take 1 cap at breakfast. Take 2 cap at lunch. Take 2 cap at dinner. Take 1 cap at bedtime. (Patient taking differently: Take 1 cap at breakfast. Take 1 cap at lunch. Take 1 cap at dinner. Take 1 cap at bedtime.) 180 capsule 6   lisinopril-hydrochlorothiazide (PRINZIDE,ZESTORETIC) 20-12.5 MG per tablet Take 1 tablet by mouth daily. Reported on 09/06/2015     LORazepam (ATIVAN) 1 MG tablet Take 1 mg by mouth as needed. Takes 1 tablet daily and then may take more if needed during the day     No current facility-administered medications for this visit.     PHYSICAL EXAMINATION: Performance  status (ECOG): 1 - Symptomatic but completely ambulatory  There were no vitals filed for this visit. Wt Readings from Last 3 Encounters:  10/05/20 114 lb (51.7 kg)  12/15/19 112 lb 12.8 oz (51.2 kg)  08/28/19 119 lb (54 kg)   Physical Exam Vitals reviewed.  Constitutional:      Appearance: Normal appearance.  Cardiovascular:     Rate and Rhythm: Normal rate and regular rhythm.     Pulses: Normal pulses.     Heart sounds: Normal heart sounds.  Pulmonary:     Effort: Pulmonary effort is normal.     Breath sounds: Normal breath sounds.  Chest:  Breasts:    Right:  Skin change (lumpectomy scar LOQ well healed) present. No inverted nipple, mass, nipple discharge or tenderness.     Left: Normal. No inverted nipple, mass, nipple discharge, skin change or tenderness.  Abdominal:     Palpations: Abdomen is soft. There is no hepatomegaly, splenomegaly or mass.     Tenderness: There is no abdominal tenderness.  Neurological:     General: No focal deficit present.     Mental Status: She is alert and oriented to person, place, and time.  Psychiatric:        Mood and Affect: Mood normal.        Behavior: Behavior normal.    Breast Exam Chaperone: Thana Ates     LABORATORY DATA:  I have reviewed the data as listed CMP Latest Ref Rng & Units 12/09/2020 10/05/2020 03/29/2020  Glucose 70 - 99 mg/dL 88 106(H) 89  BUN 8 - 23 mg/dL 20 20 28(H)  Creatinine 0.44 - 1.00 mg/dL 1.25(H) 1.10(H) 1.25(H)  Sodium 135 - 145 mmol/L 138 137 136  Potassium 3.5 - 5.1 mmol/L 4.0 3.7 3.7  Chloride 98 - 111 mmol/L 103 101 103  CO2 22 - 32 mmol/L _0 Calcium 8.9 - 10.3 mg/dL 9.2 9.2 9.1  Total Protein 6.5 - 8.1 g/dL 7.1 6.5 6.9  Total Bilirubin 0.3 - 1.2 mg/dL 0.4 0.6 0.6  Alkaline Phos 38 - 126 U/L 38 47 37(L)  AST 15 - 41 U/L 16 18 14(L)  ALT 0 - 44 U/L _1 No results found for: [id number] Lab Results  Component Value Date   WBC 5.6 12/09/2020   HGB 12.9 12/09/2020   HCT 39.3 12/09/2020   MCV 93.6 12/09/2020   PLT 188 12/09/2020   NEUTROABS 3.8 12/09/2020    ASSESSMENT:  1.  Stage III adenocarcinoma the sigmoid colon: - She was diagnosed in 05/2004.  She was treated with segmental resection, followed by adjuvant chemo with Xeloda\oxaliplatin x6 cycles; received Avastin with cycle 1, then Avastin discontinued. -She has been in remission since that time. -Last colonoscopy on 08/31/2013 with Dr. Gala Romney.  He recommended repeat colonoscopy in 5 years (09/2018).   2.  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  Treated  with neoadjuvant antiestrogen therapy Arimidex x4 months.  Then underwent lumpectomy, followed by adjuvant radiation therapy. -She started on Femara 05/2005 and continued through 05/2007. -She then switched to tamoxifen from 05/2007 through 08/2009.   3.  Osteoporosis: - Patient's last DEXA scan was on 12/03/2018 with a T score of -4.6.    PLAN:  1.  Stage III adenocarcinoma the sigmoid colon: - She does not report any bleeding per rectum or melena. - No clinical evidence of recurrence.  She will follow-up with GI for colonoscopy.  We will check a CEA level in 1 year.   2.  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 - Mammogram on 12/09/2020 reviewed by me was BI-RADS Category 1. - Reviewed labs which showed normal LFTs and CBC. - RTC 1 year for follow-up with repeat mammogram.  3.  Osteoporosis: - Continue Prolia every 6 months.  She is tolerating it well.  Calcium is 9.2.   4.  Vitamin D deficiency: - Vitamin D level is low at 16.7.  She could not tolerate vitamin D supplement. - I have recommended trying a different brand.  Breast Cancer therapy associated bone loss: I have recommended calcium, Vitamin D and weight bearing exercises.  Orders placed this encounter:  No orders of the defined types were placed in this encounter.   The patient has a good understanding of the overall plan. She agrees with it. She will call with any problems that may develop before the next visit here.  Derek Jack, MD [facility] [phone]   I, Thana Ates, am acting as a scribe for Dr. Derek Jack.  I, Derek Jack MD, have reviewed the above documentation for accuracy and completeness, and I agree with the above.

## 2020-12-15 NOTE — Patient Instructions (Addendum)
[facility] at [facility] Discharge Instructions  You were seen today by Dr. Delton Coombes. He went over your recent results and scans. You will be scheduled for a mammogram after December 09, 2021. Dr. Delton Coombes will see you back in 1 year for labs and follow up.   Thank you for choosing [facility] at [facility] to provide your oncology and hematology care.  To afford each patient quality time with our provider, please arrive at least 15 minutes before your scheduled appointment time.   If you have a lab appointment with the [facility] please come in thru the Main Entrance and check in at the main information desk  You need to re-schedule your appointment should you arrive 10 or more minutes late.  We strive to give you quality time with our providers, and arriving late affects you and other patients whose appointments are after yours.  Also, if you no show three or more times for appointments you may be dismissed from the clinic at the providers discretion.     Again, thank you for choosing [facility].  Our hope is that these requests will decrease the amount of time that you wait before being seen by our physicians.       _____________________________________________________________  Should you have questions after your visit to [facility], please contact our office at [phone] between the hours of 8:00 a.m. and 4:30 p.m.  Voicemails left after 4:00 p.m. will not be returned until the following business day.  For prescription refill requests, have your pharmacy contact our office and allow 72 hours.    Cancer Center Support Programs:   > Cancer Support Group  2nd Tuesday of the month 1pm-2pm, Journey Room

## 2020-12-17 ENCOUNTER — Encounter (HOSPITAL_COMMUNITY): Payer: Self-pay | Admitting: Oncology

## 2021-01-06 DIAGNOSIS — Z23 Encounter for immunization: Secondary | ICD-10-CM | POA: Diagnosis not present

## 2021-01-24 DIAGNOSIS — H9311 Tinnitus, right ear: Secondary | ICD-10-CM | POA: Diagnosis not present

## 2021-01-24 DIAGNOSIS — H838X3 Other specified diseases of inner ear, bilateral: Secondary | ICD-10-CM | POA: Diagnosis not present

## 2021-01-24 DIAGNOSIS — H903 Sensorineural hearing loss, bilateral: Secondary | ICD-10-CM | POA: Diagnosis not present

## 2021-01-30 DIAGNOSIS — I129 Hypertensive chronic kidney disease with stage 1 through stage 4 chronic kidney disease, or unspecified chronic kidney disease: Secondary | ICD-10-CM | POA: Diagnosis not present

## 2021-01-30 DIAGNOSIS — N1831 Chronic kidney disease, stage 3a: Secondary | ICD-10-CM | POA: Diagnosis not present

## 2021-02-07 DIAGNOSIS — I10 Essential (primary) hypertension: Secondary | ICD-10-CM | POA: Diagnosis not present

## 2021-02-07 DIAGNOSIS — Z6820 Body mass index (BMI) 20.0-20.9, adult: Secondary | ICD-10-CM | POA: Diagnosis not present

## 2021-02-07 DIAGNOSIS — N390 Urinary tract infection, site not specified: Secondary | ICD-10-CM | POA: Diagnosis not present

## 2021-02-14 DIAGNOSIS — Z681 Body mass index (BMI) 19 or less, adult: Secondary | ICD-10-CM | POA: Diagnosis not present

## 2021-02-14 DIAGNOSIS — D473 Essential (hemorrhagic) thrombocythemia: Secondary | ICD-10-CM | POA: Diagnosis not present

## 2021-02-14 DIAGNOSIS — Z1331 Encounter for screening for depression: Secondary | ICD-10-CM | POA: Diagnosis not present

## 2021-02-14 DIAGNOSIS — N1831 Chronic kidney disease, stage 3a: Secondary | ICD-10-CM | POA: Diagnosis not present

## 2021-02-14 DIAGNOSIS — M1991 Primary osteoarthritis, unspecified site: Secondary | ICD-10-CM | POA: Diagnosis not present

## 2021-02-14 DIAGNOSIS — C189 Malignant neoplasm of colon, unspecified: Secondary | ICD-10-CM | POA: Diagnosis not present

## 2021-02-14 DIAGNOSIS — E538 Deficiency of other specified B group vitamins: Secondary | ICD-10-CM | POA: Diagnosis not present

## 2021-02-14 DIAGNOSIS — I10 Essential (primary) hypertension: Secondary | ICD-10-CM | POA: Diagnosis not present

## 2021-02-14 DIAGNOSIS — Z0000 Encounter for general adult medical examination without abnormal findings: Secondary | ICD-10-CM | POA: Diagnosis not present

## 2021-02-14 DIAGNOSIS — M810 Age-related osteoporosis without current pathological fracture: Secondary | ICD-10-CM | POA: Diagnosis not present

## 2021-02-14 DIAGNOSIS — R7309 Other abnormal glucose: Secondary | ICD-10-CM | POA: Diagnosis not present

## 2021-02-14 DIAGNOSIS — I129 Hypertensive chronic kidney disease with stage 1 through stage 4 chronic kidney disease, or unspecified chronic kidney disease: Secondary | ICD-10-CM | POA: Diagnosis not present

## 2021-02-14 DIAGNOSIS — C50911 Malignant neoplasm of unspecified site of right female breast: Secondary | ICD-10-CM | POA: Diagnosis not present

## 2021-02-14 DIAGNOSIS — Z13220 Encounter for screening for lipoid disorders: Secondary | ICD-10-CM | POA: Diagnosis not present

## 2021-02-14 DIAGNOSIS — F419 Anxiety disorder, unspecified: Secondary | ICD-10-CM | POA: Diagnosis not present

## 2021-03-07 DIAGNOSIS — Z961 Presence of intraocular lens: Secondary | ICD-10-CM | POA: Diagnosis not present

## 2021-03-07 DIAGNOSIS — H353131 Nonexudative age-related macular degeneration, bilateral, early dry stage: Secondary | ICD-10-CM | POA: Diagnosis not present

## 2021-04-07 ENCOUNTER — Other Ambulatory Visit (HOSPITAL_COMMUNITY): Payer: Self-pay

## 2021-04-07 DIAGNOSIS — C50911 Malignant neoplasm of unspecified site of right female breast: Secondary | ICD-10-CM

## 2021-04-10 ENCOUNTER — Inpatient Hospital Stay (HOSPITAL_COMMUNITY): Payer: Medicare Other

## 2021-04-10 ENCOUNTER — Other Ambulatory Visit: Payer: Self-pay

## 2021-04-10 ENCOUNTER — Inpatient Hospital Stay (HOSPITAL_COMMUNITY): Payer: Medicare Other | Attending: Hematology

## 2021-04-10 VITALS — BP 145/70 | HR 60 | Temp 96.9°F | Resp 17 | Ht 64.0 in | Wt 111.4 lb

## 2021-04-10 DIAGNOSIS — Z853 Personal history of malignant neoplasm of breast: Secondary | ICD-10-CM | POA: Diagnosis not present

## 2021-04-10 DIAGNOSIS — M810 Age-related osteoporosis without current pathological fracture: Secondary | ICD-10-CM

## 2021-04-10 DIAGNOSIS — C50911 Malignant neoplasm of unspecified site of right female breast: Secondary | ICD-10-CM

## 2021-04-10 DIAGNOSIS — M818 Other osteoporosis without current pathological fracture: Secondary | ICD-10-CM | POA: Diagnosis not present

## 2021-04-10 LAB — COMPREHENSIVE METABOLIC PANEL
ALT: 12 U/L (ref 0–44)
AST: 16 U/L (ref 15–41)
Albumin: 4 g/dL (ref 3.5–5.0)
Alkaline Phosphatase: 45 U/L (ref 38–126)
Anion gap: 7 (ref 5–15)
BUN: 19 mg/dL (ref 8–23)
CO2: 29 mmol/L (ref 22–32)
Calcium: 9.4 mg/dL (ref 8.9–10.3)
Chloride: 104 mmol/L (ref 98–111)
Creatinine, Ser: 1.07 mg/dL — ABNORMAL HIGH (ref 0.44–1.00)
GFR, Estimated: 53 mL/min — ABNORMAL LOW (ref >60)
Glucose, Bld: 101 mg/dL — ABNORMAL HIGH (ref 70–99)
Potassium: 4 mmol/L (ref 3.5–5.1)
Sodium: 140 mmol/L (ref 135–145)
Total Bilirubin: 0.4 mg/dL (ref 0.3–1.2)
Total Protein: 6.7 g/dL (ref 6.5–8.1)

## 2021-04-10 MED ORDER — DENOSUMAB 60 MG/ML ~~LOC~~ SOSY
60.0000 mg | PREFILLED_SYRINGE | Freq: Once | SUBCUTANEOUS | Status: AC
  Administered 2021-04-10: 60 mg via SUBCUTANEOUS
  Filled 2021-04-10: qty 1

## 2021-04-10 NOTE — Patient Instructions (Signed)
[facility]  Discharge Instructions: Thank you for choosing [facility] to provide your oncology and hematology care.  If you have a lab appointment with the Cancer Center, please come in thru the Main Entrance and check in at the main information desk.  Wear comfortable clothing and clothing appropriate for easy access to any Portacath or PICC line.   We strive to give you quality time with your provider. You may need to reschedule your appointment if you arrive late (15 or more minutes).  Arriving late affects you and other patients whose appointments are after yours.  Also, if you miss three or more appointments without notifying the office, you may be dismissed from the clinic at the provider's discretion.      For prescription refill requests, have your pharmacy contact our office and allow 72 hours for refills to be completed.        To help prevent nausea and vomiting after your treatment, we encourage you to take your nausea medication as directed.  BELOW ARE SYMPTOMS THAT SHOULD BE REPORTED IMMEDIATELY: *FEVER GREATER THAN 100.4 F (38 C) OR HIGHER *CHILLS OR SWEATING *NAUSEA AND VOMITING THAT IS NOT CONTROLLED WITH YOUR NAUSEA MEDICATION *UNUSUAL SHORTNESS OF BREATH *UNUSUAL BRUISING OR BLEEDING *URINARY PROBLEMS (pain or burning when urinating, or frequent urination) *BOWEL PROBLEMS (unusual diarrhea, constipation, pain near the anus) TENDERNESS IN MOUTH AND THROAT WITH OR WITHOUT PRESENCE OF ULCERS (sore throat, sores in mouth, or a toothache) UNUSUAL RASH, SWELLING OR PAIN  UNUSUAL VAGINAL DISCHARGE OR ITCHING   Items with * indicate a potential emergency and should be followed up as soon as possible or go to the Emergency Department if any problems should occur.  Please show the CHEMOTHERAPY ALERT CARD or IMMUNOTHERAPY ALERT CARD at check-in to the Emergency Department and triage nurse.  Should you have questions after your visit or need to cancel  or reschedule your appointment, please contact [facility] [phone]  and follow the prompts.  Office hours are 8:00 a.m. to 4:30 p.m. Monday - Friday. Please note that voicemails left after 4:00 p.m. may not be returned until the following business day.  We are closed weekends and major holidays. You have access to a nurse at all times for urgent questions. Please call the main number to the clinic [phone] and follow the prompts.  For any non-urgent questions, you may also contact your provider using MyChart. We now offer e-Visits for anyone 18 and older to request care online for non-urgent symptoms. For details visit [email or web address].   Also download the MyChart app! Go to the app store, search "MyChart", open the app, select [facility], and log in with your MyChart username and password.  Due to Covid, a mask is required upon entering the hospital/clinic. If you do not have a mask, one will be given to you upon arrival. For doctor visits, patients may have 1 support person aged 18 or older with them. For treatment visits, patients cannot have anyone with them due to current Covid guidelines and our immunocompromised population.  

## 2021-04-10 NOTE — Progress Notes (Signed)
Patient presents today for Prolia injection.  Patient is in satisfactory condition with no new complaints voiced.  Vital signs are stable.  Patient states that she is taking supplemental Vitamin D, but was not aware to take calcium.  She will start taking a calcium supplement.  Labs reviewed and all labs are within treatment parameters.  Calcium today is 9.4.  We will proceed with injection per MD orders.

## 2021-05-11 DIAGNOSIS — Z23 Encounter for immunization: Secondary | ICD-10-CM | POA: Diagnosis not present

## 2021-08-09 DIAGNOSIS — H9041 Sensorineural hearing loss, unilateral, right ear, with unrestricted hearing on the contralateral side: Secondary | ICD-10-CM | POA: Diagnosis not present

## 2021-08-09 DIAGNOSIS — H838X1 Other specified diseases of right inner ear: Secondary | ICD-10-CM | POA: Diagnosis not present

## 2021-08-09 DIAGNOSIS — H9311 Tinnitus, right ear: Secondary | ICD-10-CM | POA: Diagnosis not present

## 2021-08-15 DIAGNOSIS — M1991 Primary osteoarthritis, unspecified site: Secondary | ICD-10-CM | POA: Diagnosis not present

## 2021-08-15 DIAGNOSIS — Z681 Body mass index (BMI) 19 or less, adult: Secondary | ICD-10-CM | POA: Diagnosis not present

## 2021-08-15 DIAGNOSIS — I10 Essential (primary) hypertension: Secondary | ICD-10-CM | POA: Diagnosis not present

## 2021-08-15 DIAGNOSIS — N1831 Chronic kidney disease, stage 3a: Secondary | ICD-10-CM | POA: Diagnosis not present

## 2021-08-15 DIAGNOSIS — F419 Anxiety disorder, unspecified: Secondary | ICD-10-CM | POA: Diagnosis not present

## 2021-10-08 IMAGING — MG DIGITAL SCREENING BILAT W/ TOMO W/ CAD
6 of 10 series · 6 of 30 positions shown · non-contrast
Comparison: Previous exam(s).

CLINICAL DATA: Screening.

EXAM:
DIGITAL SCREENING BILATERAL MAMMOGRAM WITH TOMO AND CAD

[R MLO synth-2D (1 of 2)]
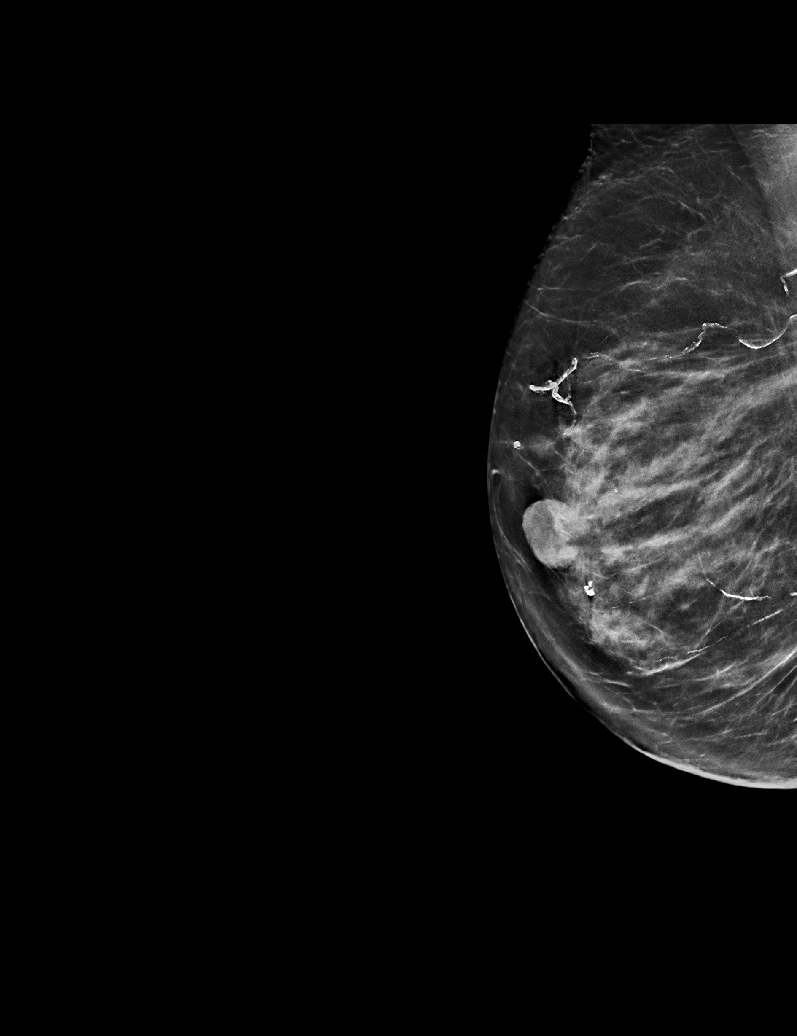

[L MLO synth-2D]
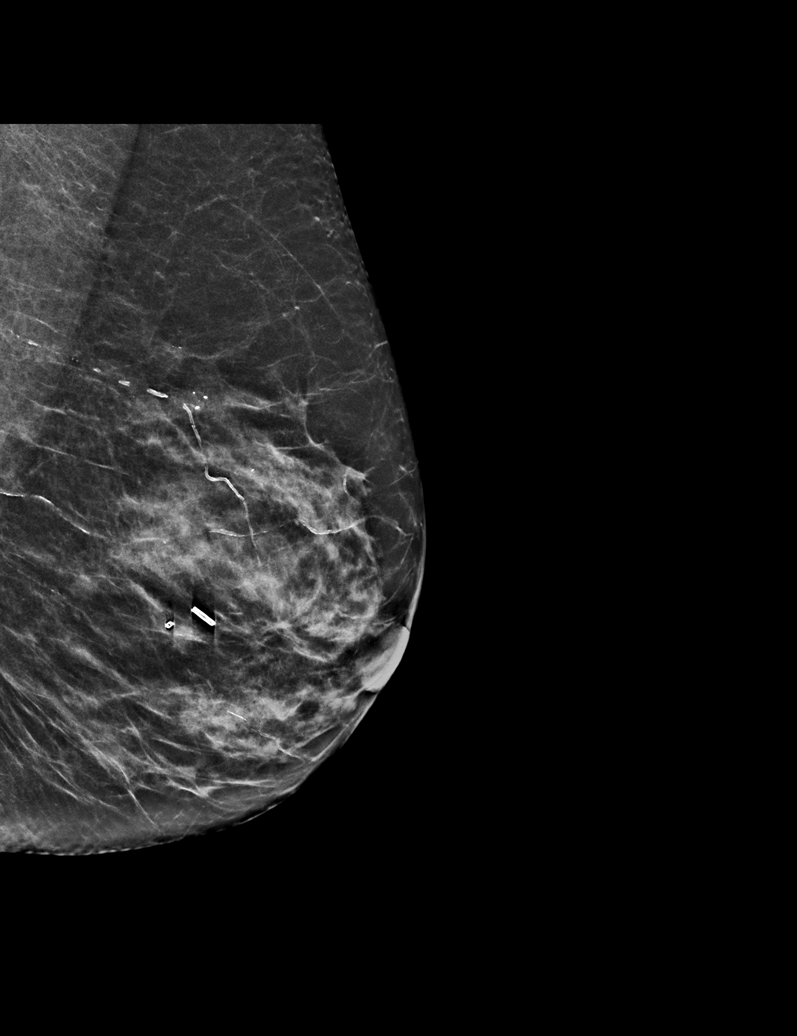

[R CC synth-2D]
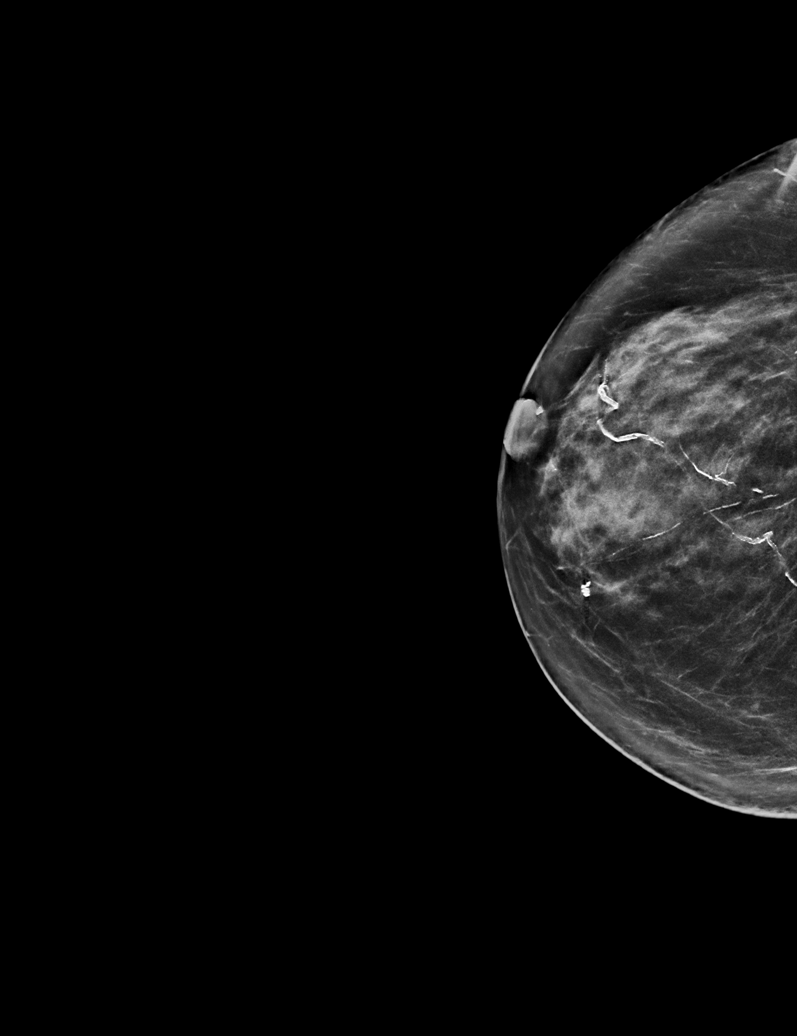

[R MLO synth-2D (2 of 2)]
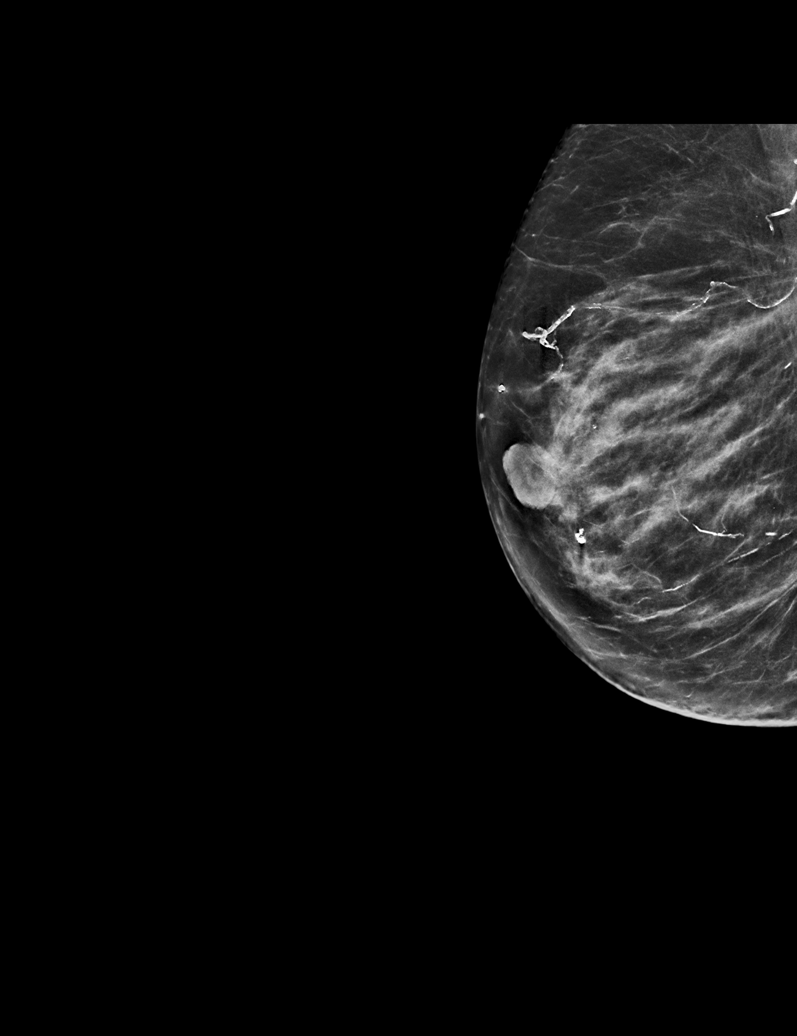

[L CC synth-2D]
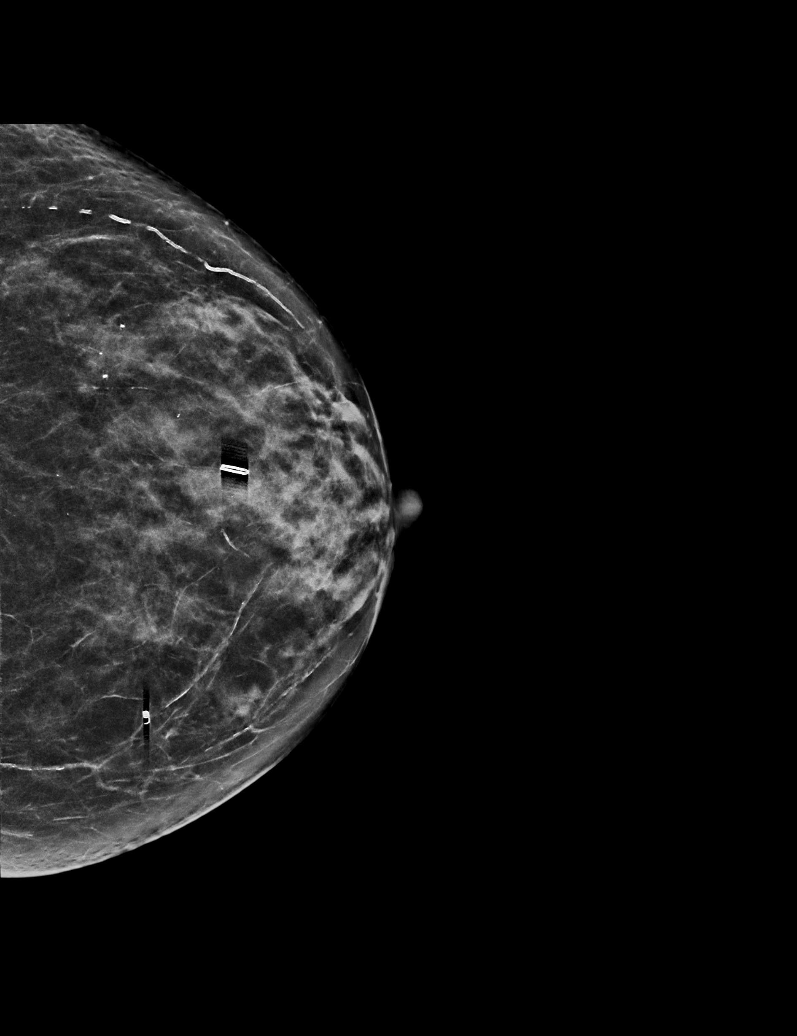

[L CC tomo · tomo slice 27/54.0]
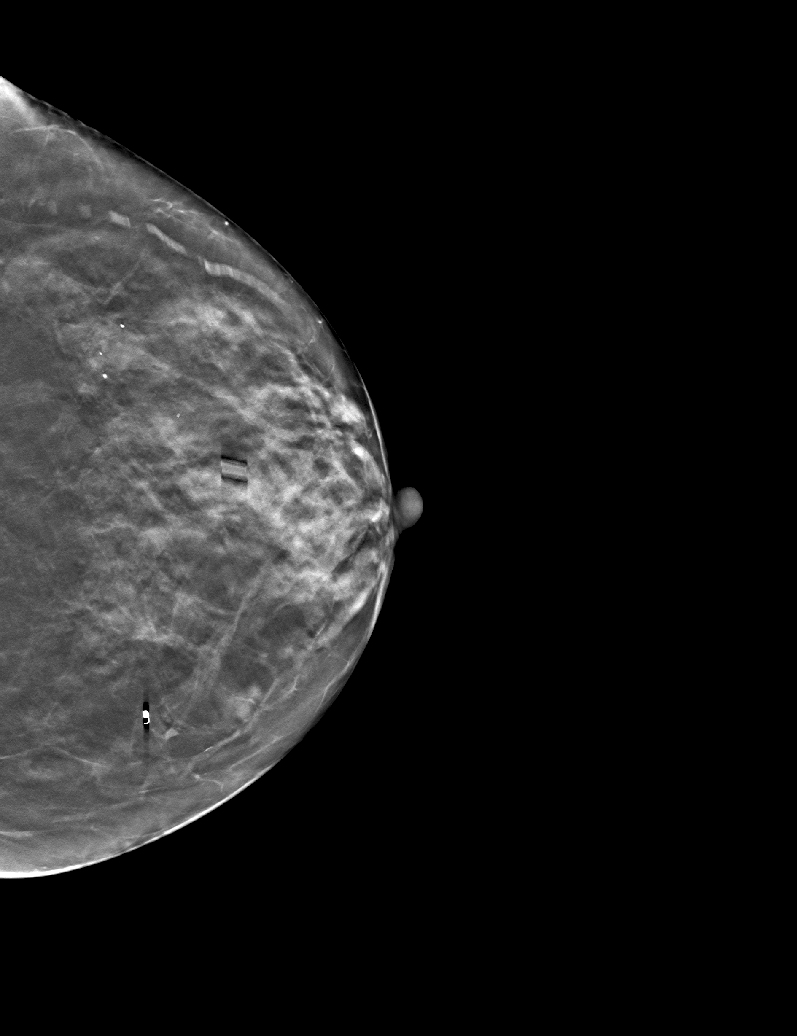

[6 of 30 positions shown; findings below may reference images not displayed]

ACR Breast Density Category c: The breast tissue is heterogeneously
dense, which may obscure small masses.
FINDINGS: There are no findings suspicious for malignancy. Images were
processed with CAD.
IMPRESSION: No mammographic evidence of malignancy. A result letter of this
screening mammogram will be mailed directly to the patient.

RECOMMENDATION:
Screening mammogram in one year. (Code:[id number])

BI-RADS CATEGORY  1: Negative.

## 2021-10-10 ENCOUNTER — Inpatient Hospital Stay (HOSPITAL_COMMUNITY): Payer: Medicare Other

## 2021-10-10 ENCOUNTER — Ambulatory Visit (HOSPITAL_COMMUNITY): Payer: Medicare Other

## 2021-10-16 ENCOUNTER — Inpatient Hospital Stay (HOSPITAL_COMMUNITY): Payer: Medicare Other | Attending: Hematology

## 2021-10-16 ENCOUNTER — Encounter (HOSPITAL_COMMUNITY): Payer: Self-pay

## 2021-10-16 ENCOUNTER — Inpatient Hospital Stay (HOSPITAL_COMMUNITY): Payer: Medicare Other

## 2021-10-16 VITALS — BP 152/53 | HR 56 | Temp 96.5°F | Resp 18

## 2021-10-16 DIAGNOSIS — E559 Vitamin D deficiency, unspecified: Secondary | ICD-10-CM | POA: Diagnosis not present

## 2021-10-16 DIAGNOSIS — M810 Age-related osteoporosis without current pathological fracture: Secondary | ICD-10-CM

## 2021-10-16 DIAGNOSIS — D649 Anemia, unspecified: Secondary | ICD-10-CM

## 2021-10-16 DIAGNOSIS — C50911 Malignant neoplasm of unspecified site of right female breast: Secondary | ICD-10-CM

## 2021-10-16 LAB — COMPREHENSIVE METABOLIC PANEL
ALT: 12 U/L (ref 0–44)
AST: 16 U/L (ref 15–41)
Albumin: 4.4 g/dL (ref 3.5–5.0)
Alkaline Phosphatase: 38 U/L (ref 38–126)
Anion gap: 9 (ref 5–15)
BUN: 22 mg/dL (ref 8–23)
CO2: 29 mmol/L (ref 22–32)
Calcium: 9.5 mg/dL (ref 8.9–10.3)
Chloride: 102 mmol/L (ref 98–111)
Creatinine, Ser: 1.22 mg/dL — ABNORMAL HIGH (ref 0.44–1.00)
GFR, Estimated: 45 mL/min — ABNORMAL LOW (ref >60)
Glucose, Bld: 89 mg/dL (ref 70–99)
Potassium: 4.3 mmol/L (ref 3.5–5.1)
Sodium: 140 mmol/L (ref 135–145)
Total Bilirubin: 0.8 mg/dL (ref 0.3–1.2)
Total Protein: 7 g/dL (ref 6.5–8.1)

## 2021-10-16 MED ORDER — DENOSUMAB 60 MG/ML ~~LOC~~ SOSY
60.0000 mg | PREFILLED_SYRINGE | Freq: Once | SUBCUTANEOUS | Status: AC
  Administered 2021-10-16: 60 mg via SUBCUTANEOUS
  Filled 2021-10-16: qty 1

## 2021-10-16 NOTE — Patient Instructions (Signed)
[facility]  Discharge Instructions: Thank you for choosing [facility] to provide your oncology and hematology care.  If you have a lab appointment with the [facility], please come in thru the Main Entrance and check in at the main information desk.  Wear comfortable clothing and clothing appropriate for easy access to any Portacath or PICC line.   We strive to give you quality time with your provider. You may need to reschedule your appointment if you arrive late (15 or more minutes).  Arriving late affects you and other patients whose appointments are after yours.  Also, if you miss three or more appointments without notifying the office, you may be dismissed from the clinic at the provider's discretion.      For prescription refill requests, have your pharmacy contact our office and allow 72 hours for refills to be completed.    Today you received the following chemotherapy and/or immunotherapy agents prolia. Denosumab injection What is this medication? DENOSUMAB (den oh sue mab) slows bone breakdown. Prolia is used to treat osteoporosis in women after menopause and in men, and in people who are taking corticosteroids for 6 months or more. Delton See is used to treat a high calcium level due to cancer and to prevent bone fractures and other bone problems caused by multiple myeloma or cancer bone metastases. Delton See is also used to treat giant cell tumor of the bone. This medicine may be used for other purposes; ask your health care provider or pharmacist if you have questions. COMMON BRAND NAME(S): Prolia, XGEVA What should I tell my care team before I take this medication? They need to know if you have any of these conditions: dental disease having surgery or tooth extraction infection kidney disease low levels of calcium or Vitamin D in the blood malnutrition on hemodialysis skin conditions or sensitivity thyroid or parathyroid disease an unusual reaction to  denosumab, other medicines, foods, dyes, or preservatives pregnant or trying to get pregnant breast-feeding How should I use this medication? This medicine is for injection under the skin. It is given by a health care professional in a hospital or clinic setting. A special MedGuide will be given to you before each treatment. Be sure to read this information carefully each time. For Prolia, talk to your pediatrician regarding the use of this medicine in children. Special care may be needed. For Delton See, talk to your pediatrician regarding the use of this medicine in children. While this drug may be prescribed for children as young as 13 years for selected conditions, precautions do apply. Overdosage: If you think you have taken too much of this medicine contact a poison control center or emergency room at once. NOTE: This medicine is only for you. Do not share this medicine with others. What if I miss a dose? It is important not to miss your dose. Call your doctor or health care professional if you are unable to keep an appointment. What may interact with this medication? Do not take this medicine with any of the following medications: other medicines containing denosumab This medicine may also interact with the following medications: medicines that lower your chance of fighting infection steroid medicines like prednisone or cortisone This list may not describe all possible interactions. Give your health care provider a list of all the medicines, herbs, non-prescription drugs, or dietary supplements you use. Also tell them if you smoke, drink alcohol, or use illegal drugs. Some items may interact with your medicine. What should I  watch for while using this medication? Visit your doctor or health care professional for regular checks on your progress. Your doctor or health care professional may order blood tests and other tests to see how you are doing. Call your doctor or health care professional for  advice if you get a fever, chills or sore throat, or other symptoms of a cold or flu. Do not treat yourself. This drug may decrease your body's ability to fight infection. Try to avoid being around people who are sick. You should make sure you get enough calcium and vitamin D while you are taking this medicine, unless your doctor tells you not to. Discuss the foods you eat and the vitamins you take with your health care professional. See your dentist regularly. Brush and floss your teeth as directed. Before you have any dental work done, tell your dentist you are receiving this medicine. Do not become pregnant while taking this medicine or for 5 months after stopping it. Talk with your doctor or health care professional about your birth control options while taking this medicine. Women should inform their doctor if they wish to become pregnant or think they might be pregnant. There is a potential for serious side effects to an unborn child. Talk to your health care professional or pharmacist for more information. What side effects may I notice from receiving this medication? Side effects that you should report to your doctor or health care professional as soon as possible: allergic reactions like skin rash, itching or hives, swelling of the face, lips, or tongue bone pain breathing problems dizziness jaw pain, especially after dental work redness, blistering, peeling of the skin signs and symptoms of infection like fever or chills; cough; sore throat; pain or trouble passing urine signs of low calcium like fast heartbeat, muscle cramps or muscle pain; pain, tingling, numbness in the hands or feet; seizures unusual bleeding or bruising unusually weak or tired Side effects that usually do not require medical attention (report to your doctor or health care professional if they continue or are bothersome): constipation diarrhea headache joint pain loss of appetite muscle pain runny  nose tiredness upset stomach This list may not describe all possible side effects. Call your doctor for medical advice about side effects. You may report side effects to FDA at 1-800-FDA-1088. Where should I keep my medication? This medicine is only given in a clinic, doctor's office, or other health care setting and will not be stored at home. NOTE: This sheet is a summary. It may not cover all possible information. If you have questions about this medicine, talk to your doctor, pharmacist, or health care provider.  2023 Elsevier/Gold Standard (2017-07-26 00:00:00)       To help prevent nausea and vomiting after your treatment, we encourage you to take your nausea medication as directed.  BELOW ARE SYMPTOMS THAT SHOULD BE REPORTED IMMEDIATELY: *FEVER GREATER THAN 100.4 F (38 C) OR HIGHER *CHILLS OR SWEATING *NAUSEA AND VOMITING THAT IS NOT CONTROLLED WITH YOUR NAUSEA MEDICATION *UNUSUAL SHORTNESS OF BREATH *UNUSUAL BRUISING OR BLEEDING *URINARY PROBLEMS (pain or burning when urinating, or frequent urination) *BOWEL PROBLEMS (unusual diarrhea, constipation, pain near the anus) TENDERNESS IN MOUTH AND THROAT WITH OR WITHOUT PRESENCE OF ULCERS (sore throat, sores in mouth, or a toothache) UNUSUAL RASH, SWELLING OR PAIN  UNUSUAL VAGINAL DISCHARGE OR ITCHING   Items with * indicate a potential emergency and should be followed up as soon as possible or go to the Emergency Department if any problems   should occur.  Please show the CHEMOTHERAPY ALERT CARD or IMMUNOTHERAPY ALERT CARD at check-in to the Emergency Department and triage nurse.  Should you have questions after your visit or need to cancel or reschedule your appointment, please contact [facility] [phone]  and follow the prompts.  Office hours are 8:00 a.m. to 4:30 p.m. Monday - Friday. Please note that voicemails left after 4:00 p.m. may not be returned until the following business day.  We are closed weekends and  major holidays. You have access to a nurse at all times for urgent questions. Please call the main number to the clinic [phone] and follow the prompts.  For any non-urgent questions, you may also contact your provider using MyChart. We now offer e-Visits for anyone 18 and older to request care online for non-urgent symptoms. For details visit [email or web address].   Also download the MyChart app! Go to the app store, search "MyChart", open the app, select [facility], and log in with your MyChart username and password.  Masks are optional in the cancer centers. If you would like for your care team to wear a mask while they are taking care of you, please let them know. For doctor visits, patients may have with them one support person who is at least 80 years old. At this time, visitors are not allowed in the infusion area.  

## 2021-10-16 NOTE — Progress Notes (Signed)
Patient taking calcium as directed.  Denied tooth, jaw, and leg pain.  No recent or upcoming dental visits.  Labs reviewed.  Patient tolerated injection with no complaints voiced.  See MAR for details.  Patient stable during and after injection.  Site clean and dry with no bruising or swelling noted.  Band aid applied.  Vss with discharge and left in satisfactory condition with no s/s of distress noted.   

## 2021-12-11 ENCOUNTER — Other Ambulatory Visit: Payer: Self-pay

## 2021-12-11 ENCOUNTER — Ambulatory Visit (HOSPITAL_COMMUNITY)
Admission: RE | Admit: 2021-12-11 | Discharge: 2021-12-11 | Disposition: A | Discharge status: Home / Self Care | Payer: Medicare Other | Source: Ambulatory Visit | Attending: Hematology | Admitting: Hematology

## 2021-12-11 ENCOUNTER — Inpatient Hospital Stay: Payer: Medicare Other | Attending: Hematology

## 2021-12-11 DIAGNOSIS — Z853 Personal history of malignant neoplasm of breast: Secondary | ICD-10-CM | POA: Insufficient documentation

## 2021-12-11 DIAGNOSIS — C50911 Malignant neoplasm of unspecified site of right female breast: Secondary | ICD-10-CM

## 2021-12-11 DIAGNOSIS — Z1231 Encounter for screening mammogram for malignant neoplasm of breast: Secondary | ICD-10-CM | POA: Insufficient documentation

## 2021-12-11 DIAGNOSIS — D649 Anemia, unspecified: Secondary | ICD-10-CM

## 2021-12-11 DIAGNOSIS — M810 Age-related osteoporosis without current pathological fracture: Secondary | ICD-10-CM

## 2021-12-11 DIAGNOSIS — E559 Vitamin D deficiency, unspecified: Secondary | ICD-10-CM | POA: Diagnosis not present

## 2021-12-11 DIAGNOSIS — Z85038 Personal history of other malignant neoplasm of large intestine: Secondary | ICD-10-CM | POA: Insufficient documentation

## 2021-12-11 DIAGNOSIS — E538 Deficiency of other specified B group vitamins: Secondary | ICD-10-CM

## 2021-12-11 DIAGNOSIS — D529 Folate deficiency anemia, unspecified: Secondary | ICD-10-CM

## 2021-12-11 LAB — COMPREHENSIVE METABOLIC PANEL
ALT: 11 U/L (ref 0–44)
AST: 16 U/L (ref 15–41)
Albumin: 4.3 g/dL (ref 3.5–5.0)
Alkaline Phosphatase: 39 U/L (ref 38–126)
Anion gap: 9 (ref 5–15)
BUN: 21 mg/dL (ref 8–23)
CO2: 24 mmol/L (ref 22–32)
Calcium: 9.1 mg/dL (ref 8.9–10.3)
Chloride: 104 mmol/L (ref 98–111)
Creatinine, Ser: 1.15 mg/dL — ABNORMAL HIGH (ref 0.44–1.00)
GFR, Estimated: 48 mL/min — ABNORMAL LOW (ref >60)
Glucose, Bld: 88 mg/dL (ref 70–99)
Potassium: 3.9 mmol/L (ref 3.5–5.1)
Sodium: 137 mmol/L (ref 135–145)
Total Bilirubin: 0.6 mg/dL (ref 0.3–1.2)
Total Protein: 7.3 g/dL (ref 6.5–8.1)

## 2021-12-11 LAB — CBC WITH DIFFERENTIAL/PLATELET
Abs Immature Granulocytes: 0.02 10*3/uL (ref 0.00–0.07)
Basophils Absolute: 0.1 10*3/uL (ref 0.0–0.1)
Basophils Relative: 1 %
Eosinophils Absolute: 0.1 10*3/uL (ref 0.0–0.5)
Eosinophils Relative: 2 %
HCT: 39 % (ref 36.0–46.0)
Hemoglobin: 13 g/dL (ref 12.0–15.0)
Immature Granulocytes: 0 %
Lymphocytes Relative: 22 %
Lymphs Abs: 1.4 10*3/uL (ref 0.7–4.0)
MCH: 30.9 pg (ref 26.0–34.0)
MCHC: 33.3 g/dL (ref 30.0–36.0)
MCV: 92.6 fL (ref 80.0–100.0)
Monocytes Absolute: 0.6 10*3/uL (ref 0.1–1.0)
Monocytes Relative: 9 %
Neutro Abs: 4.1 10*3/uL (ref 1.7–7.7)
Neutrophils Relative %: 66 %
Platelets: 179 10*3/uL (ref 150–400)
RBC: 4.21 MIL/uL (ref 3.87–5.11)
RDW: 13.4 % (ref 11.5–15.5)
WBC: 6.3 10*3/uL (ref 4.0–10.5)
nRBC: 0 % (ref 0.0–0.2)

## 2021-12-11 LAB — LACTATE DEHYDROGENASE: LDH: 136 U/L (ref 98–192)

## 2021-12-11 LAB — VITAMIN D 25 HYDROXY (VIT D DEFICIENCY, FRACTURES): Vit D, 25-Hydroxy: 35.4 ng/mL (ref 30–100)

## 2021-12-11 LAB — VITAMIN B12: Vitamin B-12: 246 pg/mL (ref 180–914)

## 2021-12-11 LAB — FOLATE: Folate: 14.7 ng/mL (ref >5.9)

## 2021-12-18 ENCOUNTER — Ambulatory Visit (HOSPITAL_COMMUNITY): Payer: Medicare Other | Admitting: Hematology

## 2021-12-22 DIAGNOSIS — Z23 Encounter for immunization: Secondary | ICD-10-CM | POA: Diagnosis not present

## 2021-12-26 ENCOUNTER — Inpatient Hospital Stay (HOSPITAL_BASED_OUTPATIENT_CLINIC_OR_DEPARTMENT_OTHER): Payer: Medicare Other | Admitting: Hematology

## 2021-12-26 VITALS — BP 153/73 | HR 62 | Temp 97.3°F | Resp 18 | Ht 63.0 in | Wt 117.1 lb

## 2021-12-26 DIAGNOSIS — E559 Vitamin D deficiency, unspecified: Secondary | ICD-10-CM

## 2021-12-26 DIAGNOSIS — M810 Age-related osteoporosis without current pathological fracture: Secondary | ICD-10-CM | POA: Diagnosis not present

## 2021-12-26 DIAGNOSIS — Z1231 Encounter for screening mammogram for malignant neoplasm of breast: Secondary | ICD-10-CM

## 2021-12-26 DIAGNOSIS — C50911 Malignant neoplasm of unspecified site of right female breast: Secondary | ICD-10-CM | POA: Diagnosis not present

## 2021-12-26 DIAGNOSIS — Z85038 Personal history of other malignant neoplasm of large intestine: Secondary | ICD-10-CM | POA: Diagnosis not present

## 2021-12-26 DIAGNOSIS — Z853 Personal history of malignant neoplasm of breast: Secondary | ICD-10-CM | POA: Diagnosis not present

## 2021-12-26 NOTE — Patient Instructions (Signed)
[facility]  Discharge Instructions  You were seen and examined today by Dr. Delton Coombes.  Dr. Delton Coombes discussed your most recent lab work and mammogram which revealed everything looks good.  Follow-up as scheduled in 1 year with labs and mammogram.    Thank you for choosing [facility] to provide your oncology and hematology care.   To afford each patient quality time with our provider, please arrive at least 15 minutes before your scheduled appointment time. You may need to reschedule your appointment if you arrive late (10 or more minutes). Arriving late affects you and other patients whose appointments are after yours.  Also, if you miss three or more appointments without notifying the office, you may be dismissed from the clinic at the provider's discretion.    Again, thank you for choosing [facility].  Our hope is that these requests will decrease the amount of time that you wait before being seen by our physicians.   If you have a lab appointment with the [facility] please come in thru the Main Entrance and check in at the main information desk.           _____________________________________________________________  Should you have questions after your visit to [facility], please contact our office at [phone] and follow the prompts.  Our office hours are 8:00 a.m. to 4:30 p.m. Monday - Thursday and 8:00 a.m. to 2:30 p.m. Friday.  Please note that voicemails left after 4:00 p.m. may not be returned until the following business day.  We are closed weekends and all major holidays.  You do have access to a nurse 24-7, just call the main number to the clinic [phone] and do not press any options, hold on the line and a nurse will answer the phone.    For prescription refill requests, have your pharmacy contact our office and allow 72 hours.    Masks are optional in the cancer centers. If  you would like for your care team to wear a mask while they are taking care of you, please let them know. You may have one support person who is at least 80 years old accompany you for your appointments.

## 2021-12-26 NOTE — Progress Notes (Signed)
[facility] [address], [place] [address]   Patient Care Team: Sharilyn Sites, MD as PCP - General (Family Medicine) Gala Romney Cristopher Estimable, MD as Consulting Physician (Gastroenterology)  SUMMARY OF ONCOLOGIC HISTORY: Oncology History  Invasive ductal carcinoma of right breast ([facility])  07/19/2004 Imaging   PET- increased uptake in posterior aspect of right breast   08/09/2004 Imaging   Mammogram and [place]   08/09/2004 Initial Diagnosis   Invasive ductal carcinoma of right breast on needle core biopsy of right breast   08/22/2004 Imaging   MRI B/L Breast- 1.8 x 1.4 x 1.3 cm right breast mass   08/29/2004 - 12/13/2004 Chemotherapy   Arimidex x 4 months neoadjuvantly- 77% shrinkage by volume size   12/20/2004 Surgery   Right partial mastectomy- 2 cm, ER 89%, PR 10%, Her2 negative, Ki-67 16%.     02/06/2005 - 03/28/2005 Radiation Therapy   By Dr. Elba Barman   05/15/2005 - 05/06/2007 Chemotherapy   Femara   08/29/2005 Imaging   Mammogram s/p chemo and resection.  BIRADS 2   05/06/2007 - 09/06/2009 Chemotherapy   Tamoxifen   Adenocarcinoma of sigmoid colon (Pat[facility] Kline)  05/29/2004 Initial Diagnosis   Adenocarcinoma of sigmoid colon   05/29/2004 Surgery   Segmental resection, 6 cm maximum size, adenocarcinoma, 6/15 lymph nodes positive for disease, T3N2   07/25/2004 - 11/15/2004 Chemotherapy   Xeloda + Oxaliplatin, Avastin x 6 cycles.  Avastin d/c after cycle 1    Remission       CHIEF COMPLIANT: Follow-up of right breast cancer and colon cancer   INTERVAL HISTORY: [name] is a 81 y.o. female seen for follow-up of right breast cancer and colon cancer.  Denies any change in bowel habits or bleeding per rectum.  Denies any new onset pains.  REVIEW OF SYSTEMS:   Review of Systems  Constitutional:  Negative for appetite change and fatigue (75%).  Gastrointestinal:  Positive for constipation.  Neurological:  Positive for numbness (tingling hands and feet).  All other systems  reviewed and are negative.   I have reviewed the past medical history, past surgical history, social history and family history with the patient and they are unchanged from previous note.   ALLERGIES:   is allergic to penicillins.   MEDICATIONS:  Current Outpatient Medications  Medication Sig Dispense Refill   acetaminophen (TYLENOL) 325 MG tablet Take 650 mg by mouth as needed for mild pain.     denosumab (PROLIA) 60 MG/ML SOLN injection Inject 60 mg into the skin every 6 (six) months. Administer in upper arm, thigh, or abdomen     Ergocalciferol 50 MCG (2000 UT) CAPS Take 1 capsule by mouth daily.     gabapentin (NEURONTIN) 300 MG capsule Take 1 cap at breakfast. Take 2 cap at lunch. Take 2 cap at dinner. Take 1 cap at bedtime. (Patient taking differently: Take 1 cap at breakfast. Take 1 cap at lunch. Take 1 cap at dinner. Take 1 cap at bedtime.) 180 capsule 6   lisinopril-hydrochlorothiazide (ZESTORETIC) 20-12.5 MG tablet Take 1 tablet by mouth daily.     LORazepam (ATIVAN) 1 MG tablet Take 1 mg by mouth as needed. Takes 1 tablet daily and then may take more if needed during the day     meclizine (ANTIVERT) 25 MG tablet Take 50 mg by mouth 3 (three) times daily.     No current facility-administered medications for this visit.     PHYSICAL EXAMINATION: Performance status (ECOG): 1 -  Symptomatic but completely ambulatory  Vitals:   12/26/21 1436  BP: (!) 153/73  Pulse: 62  Resp: 18  Temp: (!) 97.3 F (36.3 C)  SpO2: 98%   Wt Readings from Last 3 Encounters:  12/26/21 117 lb 1.6 oz (53.1 kg)  04/10/21 111 lb 6.4 oz (50.5 kg)  12/15/20 111 lb 15.9 oz (50.8 kg)   Physical Exam Vitals reviewed.  Constitutional:      Appearance: Normal appearance.  Cardiovascular:     Rate and Rhythm: Normal rate and regular rhythm.     Pulses: Normal pulses.     Heart sounds: Normal heart sounds.  Pulmonary:     Effort: Pulmonary effort is normal.     Breath sounds: Normal breath  sounds.  Chest:  Breasts:    Right: Skin change (lumpectomy scar LOQ well healed) present. No inverted nipple, mass, nipple discharge or tenderness.     Left: Normal. No inverted nipple, mass, nipple discharge, skin change or tenderness.  Abdominal:     Palpations: Abdomen is soft. There is no hepatomegaly, splenomegaly or mass.     Tenderness: There is no abdominal tenderness.  Neurological:     General: No focal deficit present.     Mental Status: She is alert and oriented to person, place, and time.  Psychiatric:        Mood and Affect: Mood normal.        Behavior: Behavior normal.     Breast Exam Chaperone: Thana Ates     LABORATORY DATA:  I have reviewed the data as listed    Latest Ref Rng & Units 12/11/2021   11:51 AM 10/16/2021   10:21 AM 04/10/2021    9:57 AM  CMP  Glucose 70 - 99 mg/dL 88  89  101   BUN 8 - 23 mg/dL _0 Creatinine 0.44 - 1.00 mg/dL 1.15  1.22  1.07   Sodium 135 - 145 mmol/L 137  140  140   Potassium 3.5 - 5.1 mmol/L 3.9  4.3  4.0   Chloride 98 - 111 mmol/L 104  102  104   CO2 22 - 32 mmol/L _1 Calcium 8.9 - 10.3 mg/dL 9.1  9.5  9.4   Total Protein 6.5 - 8.1 g/dL 7.3  7.0  6.7   Total Bilirubin 0.3 - 1.2 mg/dL 0.6  0.8  0.4   Alkaline Phos 38 - 126 U/L 39  38  45   AST 15 - 41 U/L _2 ALT 0 - 44 U/L _3 No results found for: "CAN153" Lab Results  Component Value Date   WBC 6.3 12/11/2021   HGB 13.0 12/11/2021   HCT 39.0 12/11/2021   MCV 92.6 12/11/2021   PLT 179 12/11/2021   NEUTROABS 4.1 12/11/2021    ASSESSMENT:  1.  Stage III adenocarcinoma the sigmoid colon: - She was diagnosed in 05/2004.  She was treated with segmental resection, followed by adjuvant chemo with Xeloda\oxaliplatin x6 cycles; received Avastin with cycle 1, then Avastin discontinued. -She has been in remission since that time. -Last colonoscopy on 08/31/2013 with Dr. Gala Romney.  He recommended repeat colonoscopy in 5 years (09/2018).    2.  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  Treated with neoadjuvant antiestrogen therapy Arimidex x4 months.  Then underwent lumpectomy, followed by adjuvant radiation therapy. -  She started on Femara 05/2005 and continued through 05/2007. -She then switched to tamoxifen from 05/2007 through 08/2009.   3.  Osteoporosis: - Patient's last DEXA scan was on 12/03/2018 with a T score of -4.6.    PLAN:  1.  Stage III adenocarcinoma the sigmoid colon: - She does not report any bleeding per rectum or melena.  No change in bowel habits. - No indication for any further testing.   2.  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  No palpable masses in bilateral breast. - Mammogram on 12/11/2021 was BI-RADS Category 1. - RTC 1 year for follow-up with repeat mammogram.  3.  Osteoporosis: - Calcium is 9.1.  Continue Prolia every 6 months.  She is tolerating it well.   4.  Vitamin D deficiency: - Vitamin D level is 35.  Continue vitamin D supplements.  Breast Cancer therapy associated bone loss: I have recommended calcium, Vitamin D and weight bearing exercises.  Orders placed this encounter:  Orders Placed This Encounter  Procedures   MM 3D SCREEN BREAST BILATERAL   CBC with Differential/Platelet   Comprehensive metabolic panel   VITAMIN D 25 Hydroxy (Vit-D Deficiency, Fractures)     The patient has a good understanding of the overall plan. She agrees with it. She will call with any problems that may develop before the next visit here.  Derek Jack, MD [facility] [phone]

## 2022-02-05 DIAGNOSIS — Q233 Congenital mitral insufficiency: Secondary | ICD-10-CM | POA: Diagnosis not present

## 2022-02-05 DIAGNOSIS — M1991 Primary osteoarthritis, unspecified site: Secondary | ICD-10-CM | POA: Diagnosis not present

## 2022-02-05 DIAGNOSIS — C189 Malignant neoplasm of colon, unspecified: Secondary | ICD-10-CM | POA: Diagnosis not present

## 2022-02-05 DIAGNOSIS — C50911 Malignant neoplasm of unspecified site of right female breast: Secondary | ICD-10-CM | POA: Diagnosis not present

## 2022-02-05 DIAGNOSIS — I10 Essential (primary) hypertension: Secondary | ICD-10-CM | POA: Diagnosis not present

## 2022-02-05 DIAGNOSIS — D473 Essential (hemorrhagic) thrombocythemia: Secondary | ICD-10-CM | POA: Diagnosis not present

## 2022-02-05 DIAGNOSIS — F419 Anxiety disorder, unspecified: Secondary | ICD-10-CM | POA: Diagnosis not present

## 2022-02-05 DIAGNOSIS — Z681 Body mass index (BMI) 19 or less, adult: Secondary | ICD-10-CM | POA: Diagnosis not present

## 2022-02-05 DIAGNOSIS — I129 Hypertensive chronic kidney disease with stage 1 through stage 4 chronic kidney disease, or unspecified chronic kidney disease: Secondary | ICD-10-CM | POA: Diagnosis not present

## 2022-02-05 DIAGNOSIS — Z0000 Encounter for general adult medical examination without abnormal findings: Secondary | ICD-10-CM | POA: Diagnosis not present

## 2022-02-05 DIAGNOSIS — E538 Deficiency of other specified B group vitamins: Secondary | ICD-10-CM | POA: Diagnosis not present

## 2022-02-05 DIAGNOSIS — C187 Malignant neoplasm of sigmoid colon: Secondary | ICD-10-CM | POA: Diagnosis not present

## 2022-02-05 DIAGNOSIS — Z1331 Encounter for screening for depression: Secondary | ICD-10-CM | POA: Diagnosis not present

## 2022-02-05 DIAGNOSIS — M810 Age-related osteoporosis without current pathological fracture: Secondary | ICD-10-CM | POA: Diagnosis not present

## 2022-03-06 DIAGNOSIS — Z961 Presence of intraocular lens: Secondary | ICD-10-CM | POA: Diagnosis not present

## 2022-03-06 DIAGNOSIS — H353131 Nonexudative age-related macular degeneration, bilateral, early dry stage: Secondary | ICD-10-CM | POA: Diagnosis not present

## 2022-04-16 ENCOUNTER — Encounter (HOSPITAL_COMMUNITY): Payer: Self-pay | Admitting: Hematology

## 2022-04-18 ENCOUNTER — Ambulatory Visit: Payer: Medicare Other

## 2022-04-18 ENCOUNTER — Other Ambulatory Visit: Payer: Medicare Other

## 2022-04-20 ENCOUNTER — Other Ambulatory Visit: Payer: Self-pay

## 2022-04-20 DIAGNOSIS — Z1231 Encounter for screening mammogram for malignant neoplasm of breast: Secondary | ICD-10-CM

## 2022-04-23 ENCOUNTER — Inpatient Hospital Stay: Payer: Medicare Other

## 2022-04-23 ENCOUNTER — Encounter (HOSPITAL_COMMUNITY): Payer: Self-pay | Admitting: Hematology

## 2022-04-23 ENCOUNTER — Inpatient Hospital Stay: Payer: Medicare Other | Attending: Hematology

## 2022-04-23 VITALS — BP 163/67 | HR 61 | Temp 98.3°F | Resp 16

## 2022-04-23 DIAGNOSIS — Z1231 Encounter for screening mammogram for malignant neoplasm of breast: Secondary | ICD-10-CM

## 2022-04-23 DIAGNOSIS — M810 Age-related osteoporosis without current pathological fracture: Secondary | ICD-10-CM | POA: Diagnosis not present

## 2022-04-23 DIAGNOSIS — Z853 Personal history of malignant neoplasm of breast: Secondary | ICD-10-CM | POA: Diagnosis not present

## 2022-04-23 LAB — COMPREHENSIVE METABOLIC PANEL
ALT: 12 U/L (ref 0–44)
AST: 17 U/L (ref 15–41)
Albumin: 4.2 g/dL (ref 3.5–5.0)
Alkaline Phosphatase: 43 U/L (ref 38–126)
Anion gap: 9 (ref 5–15)
BUN: 19 mg/dL (ref 8–23)
CO2: 28 mmol/L (ref 22–32)
Calcium: 9.2 mg/dL (ref 8.9–10.3)
Chloride: 100 mmol/L (ref 98–111)
Creatinine, Ser: 1.18 mg/dL — ABNORMAL HIGH (ref 0.44–1.00)
GFR, Estimated: 47 mL/min — ABNORMAL LOW (ref >60)
Glucose, Bld: 81 mg/dL (ref 70–99)
Potassium: 3.6 mmol/L (ref 3.5–5.1)
Sodium: 137 mmol/L (ref 135–145)
Total Bilirubin: 0.4 mg/dL (ref 0.3–1.2)
Total Protein: 7.1 g/dL (ref 6.5–8.1)

## 2022-04-23 MED ORDER — DENOSUMAB 60 MG/ML ~~LOC~~ SOSY
60.0000 mg | PREFILLED_SYRINGE | Freq: Once | SUBCUTANEOUS | Status: AC
  Administered 2022-04-23: 60 mg via SUBCUTANEOUS
  Filled 2022-04-23: qty 1

## 2022-04-23 NOTE — Patient Instructions (Signed)
[facility]  Discharge Instructions: Thank you for choosing [facility] to provide your oncology and hematology care.  If you have a lab appointment with the [facility], please come in thru the Main Entrance and check in at the main information desk.  Wear comfortable clothing and clothing appropriate for easy access to any Portacath or PICC line.   We strive to give you quality time with your provider. You may need to reschedule your appointment if you arrive late (15 or more minutes).  Arriving late affects you and other patients whose appointments are after yours.  Also, if you miss three or more appointments without notifying the office, you may be dismissed from the clinic at the provider's discretion.      For prescription refill requests, have your pharmacy contact our office and allow 72 hours for refills to be completed.    Today you received the following chemotherapy and/or immunotherapy agents Prolia      To help prevent nausea and vomiting after your treatment, we encourage you to take your nausea medication as directed.  BELOW ARE SYMPTOMS THAT SHOULD BE REPORTED IMMEDIATELY: *FEVER GREATER THAN 100.4 F (38 C) OR HIGHER *CHILLS OR SWEATING *NAUSEA AND VOMITING THAT IS NOT CONTROLLED WITH YOUR NAUSEA MEDICATION *UNUSUAL SHORTNESS OF BREATH *UNUSUAL BRUISING OR BLEEDING *URINARY PROBLEMS (pain or burning when urinating, or frequent urination) *BOWEL PROBLEMS (unusual diarrhea, constipation, pain near the anus) TENDERNESS IN MOUTH AND THROAT WITH OR WITHOUT PRESENCE OF ULCERS (sore throat, sores in mouth, or a toothache) UNUSUAL RASH, SWELLING OR PAIN  UNUSUAL VAGINAL DISCHARGE OR ITCHING   Items with * indicate a potential emergency and should be followed up as soon as possible or go to the Emergency Department if any problems should occur.  Please show the CHEMOTHERAPY ALERT CARD or IMMUNOTHERAPY ALERT CARD at check-in to the Emergency  Department and triage nurse.  Should you have questions after your visit or need to cancel or reschedule your appointment, please contact [facility] [phone]  and follow the prompts.  Office hours are 8:00 a.m. to 4:30 p.m. Monday - Friday. Please note that voicemails left after 4:00 p.m. may not be returned until the following business day.  We are closed weekends and major holidays. You have access to a nurse at all times for urgent questions. Please call the main number to the clinic [phone] and follow the prompts.  For any non-urgent questions, you may also contact your provider using MyChart. We now offer e-Visits for anyone 34 and older to request care online for non-urgent symptoms. For details visit [email or web address].   Also download the MyChart app! Go to the app store, search "MyChart", open the app, select [facility], and log in with your MyChart username and password.

## 2022-04-23 NOTE — Progress Notes (Signed)
Patient presents today for Prolia injection per providers order.  Calcium today is 9.2.  Patient is taking Calcium/Vitamin D supplements, has no prior or upcoming dental work, and no jaw pain.  Stable during administration without incident; injection site WNL; see MAR for injection details.  Patient tolerated procedure well and without incident.  No questions or complaints noted at this time.

## 2022-05-31 ENCOUNTER — Encounter (HOSPITAL_COMMUNITY): Payer: Self-pay | Admitting: Hematology

## 2022-07-09 DIAGNOSIS — Z6820 Body mass index (BMI) 20.0-20.9, adult: Secondary | ICD-10-CM | POA: Diagnosis not present

## 2022-07-09 DIAGNOSIS — S39012A Strain of muscle, fascia and tendon of lower back, initial encounter: Secondary | ICD-10-CM | POA: Diagnosis not present

## 2022-07-09 DIAGNOSIS — M62830 Muscle spasm of back: Secondary | ICD-10-CM | POA: Diagnosis not present

## 2022-07-09 DIAGNOSIS — N1831 Chronic kidney disease, stage 3a: Secondary | ICD-10-CM | POA: Diagnosis not present

## 2022-07-09 DIAGNOSIS — M545 Low back pain, unspecified: Secondary | ICD-10-CM | POA: Diagnosis not present

## 2022-07-09 DIAGNOSIS — I129 Hypertensive chronic kidney disease with stage 1 through stage 4 chronic kidney disease, or unspecified chronic kidney disease: Secondary | ICD-10-CM | POA: Diagnosis not present

## 2022-10-22 ENCOUNTER — Other Ambulatory Visit: Payer: Self-pay

## 2022-10-22 DIAGNOSIS — M810 Age-related osteoporosis without current pathological fracture: Secondary | ICD-10-CM

## 2022-10-23 ENCOUNTER — Inpatient Hospital Stay: Payer: Medicare Other

## 2022-10-23 ENCOUNTER — Inpatient Hospital Stay: Payer: Medicare Other | Attending: Hematology

## 2022-10-23 VITALS — BP 157/70 | HR 58 | Temp 97.0°F | Resp 18

## 2022-10-23 DIAGNOSIS — Z85038 Personal history of other malignant neoplasm of large intestine: Secondary | ICD-10-CM | POA: Diagnosis not present

## 2022-10-23 DIAGNOSIS — M810 Age-related osteoporosis without current pathological fracture: Secondary | ICD-10-CM | POA: Diagnosis not present

## 2022-10-23 DIAGNOSIS — Z853 Personal history of malignant neoplasm of breast: Secondary | ICD-10-CM | POA: Insufficient documentation

## 2022-10-23 LAB — COMPREHENSIVE METABOLIC PANEL
ALT: 12 U/L (ref 0–44)
AST: 16 U/L (ref 15–41)
Albumin: 4.3 g/dL (ref 3.5–5.0)
Alkaline Phosphatase: 44 U/L (ref 38–126)
Anion gap: 11 (ref 5–15)
BUN: 21 mg/dL (ref 8–23)
CO2: 24 mmol/L (ref 22–32)
Calcium: 9.3 mg/dL (ref 8.9–10.3)
Chloride: 102 mmol/L (ref 98–111)
Creatinine, Ser: 1.26 mg/dL — ABNORMAL HIGH (ref 0.44–1.00)
GFR, Estimated: 43 mL/min — ABNORMAL LOW (ref >60)
Glucose, Bld: 95 mg/dL (ref 70–99)
Potassium: 4.1 mmol/L (ref 3.5–5.1)
Sodium: 137 mmol/L (ref 135–145)
Total Bilirubin: 0.6 mg/dL (ref 0.3–1.2)
Total Protein: 7 g/dL (ref 6.5–8.1)

## 2022-10-23 MED ORDER — DENOSUMAB 60 MG/ML ~~LOC~~ SOSY
60.0000 mg | PREFILLED_SYRINGE | Freq: Once | SUBCUTANEOUS | Status: AC
  Administered 2022-10-23: 60 mg via SUBCUTANEOUS
  Filled 2022-10-23: qty 1

## 2022-10-23 NOTE — Patient Instructions (Signed)
[facility]  Discharge Instructions: Thank you for choosing [facility] to provide your oncology and hematology care.  If you have a lab appointment with the Cancer Center - please note that after April 8th, 2024, all labs will be drawn in the cancer center.  You do not have to check in or register with the main entrance as you have in the past but will complete your check-in in the cancer center.  Wear comfortable clothing and clothing appropriate for easy access to any Portacath or PICC line.   We strive to give you quality time with your provider. You may need to reschedule your appointment if you arrive late (15 or more minutes).  Arriving late affects you and other patients whose appointments are after yours.  Also, if you miss three or more appointments without notifying the office, you may be dismissed from the clinic at the provider's discretion.      For prescription refill requests, have your pharmacy contact our office and allow 72 hours for refills to be completed.    Today you received the following chemotherapy and/or immunotherapy agents Prolia.  Denosumab Injection (Osteoporosis) What is this medication? DENOSUMAB (den oh SUE mab) prevents and treats osteoporosis. It works by Interior and spatial designer stronger and less likely to break (fracture). It is a monoclonal antibody. This medicine may be used for other purposes; ask your health care provider or pharmacist if you have questions. COMMON BRAND NAME(S): Prolia What should I tell my care team before I take this medication? They need to know if you have any of these conditions: Dental or gum disease Had thyroid or parathyroid (glands located in neck) surgery Having dental surgery or a tooth pulled Kidney disease Low levels of calcium in the blood On dialysis Poor nutrition Thyroid disease Trouble absorbing nutrients from your food An unusual or allergic reaction to denosumab, other  medications, foods, dyes, or preservatives Pregnant or trying to get pregnant Breastfeeding How should I use this medication? This medication is injected under the skin. It is given by your care team in a hospital or clinic setting. A special MedGuide will be given to you before each treatment. Be sure to read this information carefully each time. Talk to your care team about the use of this medication in children. Special care may be needed. Overdosage: If you think you have taken too much of this medicine contact a poison control center or emergency room at once. NOTE: This medicine is only for you. Do not share this medicine with others. What if I miss a dose? Keep appointments for follow-up doses. It is important not to miss your dose. Call your care team if you are unable to keep an appointment. What may interact with this medication? Do not take this medication with any of the following: Other medications that contain denosumab This medication may also interact with the following: Medications that lower your chance of fighting infection Steroid medications, such as prednisone or cortisone This list may not describe all possible interactions. Give your health care provider a list of all the medicines, herbs, non-prescription drugs, or dietary supplements you use. Also tell them if you smoke, drink alcohol, or use illegal drugs. Some items may interact with your medicine. What should I watch for while using this medication? Your condition will be monitored carefully while you are receiving this medication. You may need blood work done while taking this medication. This medication may increase your risk of getting  an infection. Call your care team for advice if you get a fever, chills, sore throat, or other symptoms of a cold or flu. Do not treat yourself. Try to avoid being around people who are sick. Tell your dentist and dental surgeon that you are taking this medication. You should not  have major dental surgery while on this medication. See your dentist to have a dental exam and fix any dental problems before starting this medication. Take good care of your teeth while on this medication. Make sure you see your dentist for regular follow-up appointments. This medication may cause low levels of calcium in your body. The risk of severe side effects is increased in people with kidney disease. Your care team may prescribe calcium and vitamin D to help prevent low calcium levels while you take this medication. It is important to take calcium and vitamin D as directed by your care team. Talk to your care team if you may be pregnant. Serious birth defects may occur if you take this medication during pregnancy and for 5 months after the last dose. You will need a negative pregnancy test before starting this medication. Contraception is recommended while taking this medication and for 5 months after the last dose. Your care team can help you find the option that works for you. Talk to your care team before breastfeeding. Changes to your treatment plan may be needed. What side effects may I notice from receiving this medication? Side effects that you should report to your care team as soon as possible: Allergic reactions--skin rash, itching, hives, swelling of the face, lips, tongue, or throat Infection--fever, chills, cough, sore throat, wounds that don't heal, pain or trouble when passing urine, general feeling of discomfort or being unwell Low calcium level--muscle pain or cramps, confusion, tingling, or numbness in the hands or feet Osteonecrosis of the jaw--pain, swelling, or redness in the mouth, numbness of the jaw, poor healing after dental work, unusual discharge from the mouth, visible bones in the mouth Severe bone, joint, or muscle pain Skin infection--skin redness, swelling, warmth, or pain Side effects that usually do not require medical attention (report these to your care team if  they continue or are bothersome): Back pain Headache Joint pain Muscle pain Pain in the hands, arms, legs, or feet Runny or stuffy nose Sore throat This list may not describe all possible side effects. Call your doctor for medical advice about side effects. You may report side effects to FDA at 1-800-FDA-1088. Where should I keep my medication? This medication is given in a hospital or clinic. It will not be stored at home. NOTE: This sheet is a summary. It may not cover all possible information. If you have questions about this medicine, talk to your doctor, pharmacist, or health care provider.  2024 Elsevier/Gold Standard (2022-04-24 00:00:00)        To help prevent nausea and vomiting after your treatment, we encourage you to take your nausea medication as directed.  BELOW ARE SYMPTOMS THAT SHOULD BE REPORTED IMMEDIATELY: *FEVER GREATER THAN 100.4 F (38 C) OR HIGHER *CHILLS OR SWEATING *NAUSEA AND VOMITING THAT IS NOT CONTROLLED WITH YOUR NAUSEA MEDICATION *UNUSUAL SHORTNESS OF BREATH *UNUSUAL BRUISING OR BLEEDING *URINARY PROBLEMS (pain or burning when urinating, or frequent urination) *BOWEL PROBLEMS (unusual diarrhea, constipation, pain near the anus) TENDERNESS IN MOUTH AND THROAT WITH OR WITHOUT PRESENCE OF ULCERS (sore throat, sores in mouth, or a toothache) UNUSUAL RASH, SWELLING OR PAIN  UNUSUAL VAGINAL DISCHARGE OR ITCHING  Items with * indicate a potential emergency and should be followed up as soon as possible or go to the Emergency Department if any problems should occur.  Please show the CHEMOTHERAPY ALERT CARD or IMMUNOTHERAPY ALERT CARD at check-in to the Emergency Department and triage nurse.  Should you have questions after your visit or need to cancel or reschedule your appointment, please contact [facility] [phone]  and follow the prompts.  Office hours are 8:00 a.m. to 4:30 p.m. Monday - Friday. Please note that voicemails left  after 4:00 p.m. may not be returned until the following business day.  We are closed weekends and major holidays. You have access to a nurse at all times for urgent questions. Please call the main number to the clinic [phone] and follow the prompts.  For any non-urgent questions, you may also contact your provider using MyChart. We now offer e-Visits for anyone 50 and older to request care online for non-urgent symptoms. For details visit [email or web address].   Also download the MyChart app! Go to the app store, search "MyChart", open the app, select [facility], and log in with your MyChart username and password.

## 2022-10-23 NOTE — Progress Notes (Signed)
Patient tolerated Prolia injection with no complaints voiced.  Site clean and dry with no bruising or swelling noted.  No complaints of pain.  Patient is taking Calcium and Vitamin D supplements daily.  Discharged with vital signs stable and no signs or symptoms of distress noted.

## 2022-11-28 DIAGNOSIS — U071 COVID-19: Secondary | ICD-10-CM | POA: Diagnosis not present

## 2022-12-17 ENCOUNTER — Inpatient Hospital Stay: Payer: Medicare Other | Attending: Hematology

## 2022-12-17 ENCOUNTER — Ambulatory Visit (HOSPITAL_COMMUNITY)
Admission: RE | Admit: 2022-12-17 | Discharge: 2022-12-17 | Disposition: A | Discharge status: Home / Self Care | Payer: Medicare Other | Source: Ambulatory Visit | Attending: Hematology | Admitting: Hematology

## 2022-12-17 DIAGNOSIS — C50911 Malignant neoplasm of unspecified site of right female breast: Secondary | ICD-10-CM | POA: Insufficient documentation

## 2022-12-17 DIAGNOSIS — Z1231 Encounter for screening mammogram for malignant neoplasm of breast: Secondary | ICD-10-CM | POA: Insufficient documentation

## 2022-12-17 DIAGNOSIS — Z85038 Personal history of other malignant neoplasm of large intestine: Secondary | ICD-10-CM | POA: Insufficient documentation

## 2022-12-17 DIAGNOSIS — Z853 Personal history of malignant neoplasm of breast: Secondary | ICD-10-CM | POA: Insufficient documentation

## 2022-12-17 DIAGNOSIS — E559 Vitamin D deficiency, unspecified: Secondary | ICD-10-CM | POA: Insufficient documentation

## 2022-12-17 DIAGNOSIS — M810 Age-related osteoporosis without current pathological fracture: Secondary | ICD-10-CM | POA: Insufficient documentation

## 2022-12-17 DIAGNOSIS — Z08 Encounter for follow-up examination after completed treatment for malignant neoplasm: Secondary | ICD-10-CM | POA: Diagnosis not present

## 2022-12-17 LAB — CBC WITH DIFFERENTIAL/PLATELET
Abs Immature Granulocytes: 0.03 10*3/uL (ref 0.00–0.07)
Basophils Absolute: 0.1 10*3/uL (ref 0.0–0.1)
Basophils Relative: 1 %
Eosinophils Absolute: 0.1 10*3/uL (ref 0.0–0.5)
Eosinophils Relative: 1 %
HCT: 38.5 % (ref 36.0–46.0)
Hemoglobin: 12.7 g/dL (ref 12.0–15.0)
Immature Granulocytes: 0 %
Lymphocytes Relative: 16 %
Lymphs Abs: 1.4 10*3/uL (ref 0.7–4.0)
MCH: 30.2 pg (ref 26.0–34.0)
MCHC: 33 g/dL (ref 30.0–36.0)
MCV: 91.7 fL (ref 80.0–100.0)
Monocytes Absolute: 0.8 10*3/uL (ref 0.1–1.0)
Monocytes Relative: 9 %
Neutro Abs: 6.1 10*3/uL (ref 1.7–7.7)
Neutrophils Relative %: 73 %
Platelets: 192 10*3/uL (ref 150–400)
RBC: 4.2 MIL/uL (ref 3.87–5.11)
RDW: 14.1 % (ref 11.5–15.5)
WBC: 8.5 10*3/uL (ref 4.0–10.5)
nRBC: 0 % (ref 0.0–0.2)

## 2022-12-17 LAB — COMPREHENSIVE METABOLIC PANEL
ALT: 15 U/L (ref 0–44)
AST: 17 U/L (ref 15–41)
Albumin: 4.4 g/dL (ref 3.5–5.0)
Alkaline Phosphatase: 51 U/L (ref 38–126)
Anion gap: 12 (ref 5–15)
BUN: 19 mg/dL (ref 8–23)
CO2: 26 mmol/L (ref 22–32)
Calcium: 9.5 mg/dL (ref 8.9–10.3)
Chloride: 99 mmol/L (ref 98–111)
Creatinine, Ser: 1.2 mg/dL — ABNORMAL HIGH (ref 0.44–1.00)
GFR, Estimated: 46 mL/min — ABNORMAL LOW (ref >60)
Glucose, Bld: 97 mg/dL (ref 70–99)
Potassium: 3.8 mmol/L (ref 3.5–5.1)
Sodium: 137 mmol/L (ref 135–145)
Total Bilirubin: 0.7 mg/dL (ref 0.3–1.2)
Total Protein: 7.1 g/dL (ref 6.5–8.1)

## 2022-12-17 LAB — VITAMIN D 25 HYDROXY (VIT D DEFICIENCY, FRACTURES): Vit D, 25-Hydroxy: 46.29 ng/mL (ref 30–100)

## 2022-12-23 NOTE — Progress Notes (Unsigned)
Patricia Kline, Patricia Kline   CLINIC:  Medical Oncology/Hematology  PCP:  Patricia Found, MD [address] / [place] [address] [phone]   REASON FOR VISIT:  Follow-up for history of right breast cancer and colon cancer  BRIEF ONCOLOGIC HISTORY:   Oncology History  Invasive ductal carcinoma of right breast (HCC)  07/19/2004 Imaging   PET- increased uptake in posterior aspect of right breast   08/09/2004 Imaging   Mammogram and [place]   08/09/2004 Initial Diagnosis   Invasive ductal carcinoma of right breast on needle core biopsy of right breast   08/22/2004 Imaging   MRI B/L Breast- 1.8 x 1.4 x 1.3 cm right breast mass   08/29/2004 - 12/13/2004 Chemotherapy   Arimidex x 4 months neoadjuvantly- 77% shrinkage by volume size   12/20/2004 Surgery   Right partial mastectomy- 2 cm, ER 89%, PR 10%, Her2 negative, Ki-67 16%.     02/06/2005 - 03/28/2005 Radiation Therapy   By Dr. Dorna Bloom   05/15/2005 - 05/06/2007 Chemotherapy   Femara   08/29/2005 Imaging   Mammogram s/p chemo and resection.  BIRADS 2   05/06/2007 - 09/06/2009 Chemotherapy   Tamoxifen   Adenocarcinoma of sigmoid colon (HCC)  05/29/2004 Initial Diagnosis   Adenocarcinoma of sigmoid colon   05/29/2004 Surgery   Segmental resection, 6 cm maximum size, adenocarcinoma, 6/15 lymph nodes positive for disease, T3N2   07/25/2004 - 11/15/2004 Chemotherapy   Xeloda + Oxaliplatin, Avastin x 6 cycles.  Avastin d/c after cycle 1    Remission       CANCER STAGING: Cancer Staging  Adenocarcinoma of sigmoid colon (HCC) Staging form: Colon and Rectum, AJCC 7th Edition - Clinical: No stage assigned - Unsigned - Pathologic: No stage assigned - Unsigned  Invasive ductal carcinoma of right breast (HCC) Staging form: Breast, AJCC 7th Edition - Clinical: Stage IA (T1c, N0, cM0) - Signed by Ellouise Newer, PA-C on 01/11/2013   INTERVAL HISTORY:   Patricia Kline, a 82 y.o. female,  returns for routine follow-up of her history of right breast cancer and history of colon cancer. Patricia Kline was last seen on 12/26/2021 by Dr. Ellin Saba.   At today's visit, she  reports feeling ***.  She denies any recent hospitalizations, surgeries, or changes in her  baseline health status.  *** She denies any changes in bowel habits, rectal bleeding, or abdominal pain. *** She has not noticed any new breast lumps or lymphadenopathy. *** She denies any new onset chest pain, dyspnea, or abdominal pain. ***  She has no new headaches, seizures, or focal neurologic deficits. *** No B symptoms such as fever, chills, night sweats, unintentional weight loss.  *** Calcium, vitamin D *** Prolia *** Fractures, bone pain, jaw pain  She reports ***% energy and ***% appetite.  She is maintaining stable weight at this time.   ASSESSMENT & PLAN:  1.  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  She was treated with segmental resection, followed by adjuvant chemo with Xeloda\oxaliplatin x6 cycles; received Avastin with cycle 1, then Avastin discontinued. - She has been in remission since that time. - Last colonoscopy on 08/31/2013 with Dr. Jena Kline.  He recommended repeat colonoscopy in 5 years (09/2018), but per GI note from 2021, additional surveillance colonoscopy was deferred but could be considered as needed in the future. - She does not report any bleeding per rectum or melena.  No change  in bowel habits. - PLAN: No indication for further testing at this time.  2.  Stage Ia invasive ductal carcinoma of the RIGHT breast (2006 - She was diagnosed in 07/2004.  Treated with neoadjuvant antiestrogen therapy Arimidex x4 months.  Then underwent lumpectomy, followed by adjuvant radiation therapy. - She started on Femara 05/2005 and continued through 05/2007. - She then switched to tamoxifen from 05/2007 through 08/2009. - Physical exam today (12/24/2022): Right lumpectomy scar  in the upper outer quadrant is within normal limits.  No palpable masses in bilateral breast.*** - Mammogram on 12/17/2022 was BI-RADS Category 1, negative. - Labs (12/17/2022) with normal CBC/D, CMP at baseline with normal LFTs. - No "red flag" symptoms per patient history today *** - PLAN: RTC in 1 year for follow-up with repeat mammogram, labs, physical exam ***  3.  Osteoporosis & vitamin D deficiency - She has been receiving Prolia since 03/05/2011.  Most recently given 10/23/2022. - Patient's last DEXA scan was on 12/03/2018 with a T score of -4.6.  - She takes calcium and vitamin D supplement at home.  *** - Labs (12/17/2022) show normal calcium 9.5 normal vitamin D 46.29  - PLAN: Since patient has completed 10+ years of treatment with Prolia, would recommend discontinuing at this time.  *** - Although optimal duration has not been identified, treatment for 5 to 10 years is considered standard.  *** Recommend Fosamax x 1 year to prevent rebound bone loss after discontinuation of Prolia.  ***  PLAN SUMMARY: >> *** >> *** >> ***   REVIEW OF SYSTEMS: ***  Review of Systems - Oncology  PHYSICAL EXAM:   Performance status (ECOG): {CHL ONC BJ:[id number]} *** There were no vitals filed for this visit. Wt Readings from Last 3 Encounters:  12/26/21 117 lb 1.6 oz (53.1 kg)  04/10/21 111 lb 6.4 oz (50.5 kg)  12/15/20 111 lb 15.9 oz (50.8 kg)   Physical Exam   PAST MEDICAL/SURGICAL HISTORY:  Past Medical History:  Diagnosis Date   Adenocarcinoma of sigmoid colon (HCC) 11/14/2010   Arthritis    Breast CA (HCC) 11/14/2010   Colon cancer (HCC) 11/14/2010   Depression    Falls    H/O: hysterectomy    History of right knee surgery    for bone cancer right knee   Hypercholesteremia    Hypertension    Invasive ductal carcinoma of right breast (HCC) 11/14/2010   Mitral valve prolapse    Osteoporosis    Osteoporosis, senile 03/05/2011   Peripheral neuropathy    Peripheral neuropathy  01/17/2012   Grade 1 and chemotherapy induced   S/P cataract surgery    bil eyes   Past Surgical History:  Procedure Laterality Date   ABDOMINAL HYSTERECTOMY     CATARACT EXTRACTION, BILATERAL     COLONOSCOPY  08/27/2008   Dr. Marlowe Alt papilla.  Surgical anastomosis at 8-10 cm from the anal verge.  Residual rectal and colonic mucosa appeared normal.   COLONOSCOPY N/A 08/31/2013   normal residual rectum/colon.    Epidural steroid injection with Kenalog     Midline   KNEE SURGERY     LOW ANTERIOR BOWEL RESECTION     PORT-A-CATH REMOVAL  02/04/2012   Procedure: REMOVAL PORT-A-CATH;  Surgeon: Fabio Bering, MD;  Location: [facility];  Service: General;  Laterality: N/A;  minor room   retinal hole repair     bilateral   TONSILLECTOMY     YAG LASER APPLICATION Left 09/23/2012   Procedure: YAG LASER  APPLICATION;  Surgeon: Loraine Leriche T. Nile Riggs, MD;  Location: [facility];  Service: Ophthalmology;  Laterality: Left;    SOCIAL HISTORY:  Social History   Socioeconomic History   Marital status: Married    Spouse name: Not on file   Number of children: Not on file   Years of education: Not on file   Highest education level: Not on file  Occupational History   Not on file  Tobacco Use   Smoking status: Never   Smokeless tobacco: Never  Substance and Sexual Activity   Alcohol use: No   Drug use: No   Sexual activity: Never  Other Topics Concern   Not on file  Social History Narrative   Not on file   Social Determinants of Health   Financial Resource Strain: Not on file  Food Insecurity: Not on file  Transportation Needs: Not on file  Physical Activity: Not on file  Stress: Not on file  Social Connections: Not on file  Intimate Partner Violence: Not on file    FAMILY HISTORY:  Family History  Problem Relation Age of Onset   Stroke Mother    Cancer Mother        breast  cancer   Heart attack Father    Colon cancer Neg Hx     CURRENT MEDICATIONS:  Current Outpatient Medications   Medication Sig Dispense Refill   acetaminophen (TYLENOL) 325 MG tablet Take 650 mg by mouth as needed for mild pain.     denosumab (PROLIA) 60 MG/ML SOLN injection Inject 60 mg into the skin every 6 (six) months. Administer in upper arm, thigh, or abdomen     Ergocalciferol 50 MCG (2000 UT) CAPS Take 1 capsule by mouth daily.     gabapentin (NEURONTIN) 300 MG capsule Take 1 cap at breakfast. Take 2 cap at lunch. Take 2 cap at dinner. Take 1 cap at bedtime. (Patient taking differently: Take 1 cap at breakfast. Take 1 cap at lunch. Take 1 cap at dinner. Take 1 cap at bedtime.) 180 capsule 6   lisinopril-hydrochlorothiazide (ZESTORETIC) 20-12.5 MG tablet Take 1 tablet by mouth daily.     LORazepam (ATIVAN) 1 MG tablet Take 1 mg by mouth as needed. Takes 1 tablet daily and then may take more if needed during the day     meclizine (ANTIVERT) 25 MG tablet Take 50 mg by mouth 3 (three) times daily.     No current facility-administered medications for this visit.    ALLERGIES:  Allergies  Allergen Reactions   Penicillins     LABORATORY DATA:  I have reviewed the labs as listed.     Latest Ref Rng & Units 12/17/2022   10:22 AM 12/11/2021   11:51 AM 12/09/2020   11:07 AM  CBC  WBC 4.0 - 10.5 K/uL 8.5  6.3  5.6   Hemoglobin 12.0 - 15.0 g/dL 78.2  95.6  21.3   Hematocrit 36.0 - 46.0 % 38.5  39.0  39.3   Platelets 150 - 400 K/uL 192  179  188       Latest Ref Rng & Units 12/17/2022   10:22 AM 10/23/2022    9:50 AM 04/23/2022   10:15 AM  CMP  Glucose 70 - 99 mg/dL 97  95  81   BUN 8 - 23 mg/dL 19  21  19    Creatinine 0.44 - 1.00 mg/dL 0.86  5.78  4.69   Sodium 135 - 145 mmol/L 137  137  137  Potassium 3.5 - 5.1 mmol/L 3.8  4.1  3.6   Chloride 98 - 111 mmol/L 99  102  100   CO2 22 - 32 mmol/L 26  24  28    Calcium 8.9 - 10.3 mg/dL 9.5  9.3  9.2   Total Protein 6.5 - 8.1 g/dL 7.1  7.0  7.1   Total Bilirubin 0.3 - 1.2 mg/dL 0.7  0.6  0.4   Alkaline Phos 38 - 126 U/L 51  44  43   AST 15 - 41  U/L 17  16  17    ALT 0 - 44 U/L 15  12  12      DIAGNOSTIC IMAGING:  I have independently reviewed the scans and discussed with the patient. MM 3D SCREEN BREAST BILATERAL  Result Date: 12/18/2022 CLINICAL DATA:  Screening. EXAM: DIGITAL SCREENING BILATERAL MAMMOGRAM WITH TOMOSYNTHESIS AND CAD TECHNIQUE: Bilateral screening digital craniocaudal and mediolateral oblique mammograms were obtained. Bilateral screening digital breast tomosynthesis was performed. The images were evaluated with computer-aided detection. COMPARISON:  Previous exam(s). ACR Breast Density Category c: The breasts are heterogeneously dense, which may obscure small masses. FINDINGS: There are no findings suspicious for malignancy. IMPRESSION: No mammographic evidence of malignancy. A result letter of this screening mammogram will be mailed directly to the patient. RECOMMENDATION: Screening mammogram in one year. (Code:SM-B-01Y) BI-RADS CATEGORY  1: Negative. Electronically Signed   By: Baird Lyons M.D.   On: 12/18/2022 12:24     WRAP UP:  All questions were answered. The patient knows to call the clinic with any problems, questions or concerns.  Medical decision making: ***  Time spent on visit: I spent {CHL ONC TIME VISIT - NGEXB:[id number]} counseling the patient face to face. The total time spent in the appointment was {CHL ONC TIME VISIT - UUVOZ:[id number]} and more than 50% was on counseling.  Carnella Gu[place], PA-C  ***

## 2022-12-24 ENCOUNTER — Other Ambulatory Visit: Payer: Self-pay

## 2022-12-24 ENCOUNTER — Encounter: Payer: Self-pay | Admitting: Physician Assistant

## 2022-12-24 ENCOUNTER — Inpatient Hospital Stay (HOSPITAL_BASED_OUTPATIENT_CLINIC_OR_DEPARTMENT_OTHER): Payer: Medicare Other | Admitting: Physician Assistant

## 2022-12-24 VITALS — BP 143/58 | HR 54 | Temp 97.7°F | Resp 16 | Ht 62.5 in | Wt 116.6 lb

## 2022-12-24 DIAGNOSIS — Z1231 Encounter for screening mammogram for malignant neoplasm of breast: Secondary | ICD-10-CM | POA: Diagnosis not present

## 2022-12-24 DIAGNOSIS — E559 Vitamin D deficiency, unspecified: Secondary | ICD-10-CM

## 2022-12-24 DIAGNOSIS — M810 Age-related osteoporosis without current pathological fracture: Secondary | ICD-10-CM

## 2022-12-24 DIAGNOSIS — Z85038 Personal history of other malignant neoplasm of large intestine: Secondary | ICD-10-CM

## 2022-12-24 DIAGNOSIS — Z853 Personal history of malignant neoplasm of breast: Secondary | ICD-10-CM | POA: Diagnosis not present

## 2022-12-24 DIAGNOSIS — C50911 Malignant neoplasm of unspecified site of right female breast: Secondary | ICD-10-CM

## 2022-12-24 DIAGNOSIS — Z08 Encounter for follow-up examination after completed treatment for malignant neoplasm: Secondary | ICD-10-CM | POA: Diagnosis not present

## 2022-12-24 NOTE — Patient Instructions (Signed)
[facility] **VISIT SUMMARY & IMPORTANT INSTRUCTIONS **   You were seen today by Rojelio Brenner PA-C for your history of breast cancer and colon cancer.    HISTORY OF COLON CANCER You do not have any evidence of recurrent colon cancer at this time.  HISTORY OF BREAST CANCER You do not have any evidence of recurrent breast cancer at this time, based on your most recent labs, mammogram, and physical exam. You will be due for repeat mammogram and office visit in 1 year.  OSTEOPOROSIS We will check bone density scan. Will continue Prolia injections every 6 months. Make sure that you are continuing to take at least 1000 mg of calcium and 1000 units of vitamin D each day. Continue annual dental visits. Let us know if you experience any jaw pain, fractures, or new onset bone pain prior to your next visit.  ** Thank you for trusting me with your healthcare!  I strive to provide all of my patients with quality care at each visit.  If you receive a survey for this visit, I would be so grateful to you for taking the time to provide feedback.  Thank you in advance!  ~ Venida Tsukamoto                   Dr. Doreatha Massed   &   Rojelio Brenner, PA-C   - - - - - - - - - - - - - - - - - -    Thank you for choosing  [facility] at [facility] to provide your oncology and hematology care.  To afford each patient quality time with our provider, please arrive at least 15 minutes before your scheduled appointment time.   If you have a lab appointment with the Cancer Center please come in thru the Main Entrance and check in at the main information desk.  You need to re-schedule your appointment should you arrive 10 or more minutes late.  We strive to give you quality time with our providers, and arriving late affects you and other patients whose appointments are after yours.  Also, if you no show three or more times for appointments you may be  dismissed from the clinic at the providers discretion.     Again, thank you for choosing [facility].  Our hope is that these requests will decrease the amount of time that you wait before being seen by our physicians.       _____________________________________________________________  Should you have questions after your visit to [facility], please contact our office at [phone] and follow the prompts.  Our office hours are 8:00 a.m. and 4:30 p.m. Monday - Friday.  Please note that voicemails left after 4:00 p.m. may not be returned until the following business day.  We are closed weekends and major holidays.  You do have access to a nurse 24-7, just call the main number to the clinic [phone] and do not press any options, hold on the line and a nurse will answer the phone.    For prescription refill requests, have your pharmacy contact our office and allow 72 hours.

## 2022-12-26 ENCOUNTER — Ambulatory Visit (HOSPITAL_COMMUNITY)
Admission: RE | Admit: 2022-12-26 | Discharge: 2022-12-26 | Disposition: A | Discharge status: Home / Self Care | Payer: Medicare Other | Source: Ambulatory Visit | Attending: Physician Assistant | Admitting: Physician Assistant

## 2022-12-26 DIAGNOSIS — M810 Age-related osteoporosis without current pathological fracture: Secondary | ICD-10-CM | POA: Diagnosis not present

## 2022-12-26 DIAGNOSIS — Z780 Asymptomatic menopausal state: Secondary | ICD-10-CM | POA: Diagnosis not present

## 2023-01-09 DIAGNOSIS — Z23 Encounter for immunization: Secondary | ICD-10-CM | POA: Diagnosis not present

## 2023-02-07 DIAGNOSIS — Z1331 Encounter for screening for depression: Secondary | ICD-10-CM | POA: Diagnosis not present

## 2023-02-07 DIAGNOSIS — N1831 Chronic kidney disease, stage 3a: Secondary | ICD-10-CM | POA: Diagnosis not present

## 2023-02-07 DIAGNOSIS — Z13220 Encounter for screening for lipoid disorders: Secondary | ICD-10-CM | POA: Diagnosis not present

## 2023-02-07 DIAGNOSIS — I129 Hypertensive chronic kidney disease with stage 1 through stage 4 chronic kidney disease, or unspecified chronic kidney disease: Secondary | ICD-10-CM | POA: Diagnosis not present

## 2023-02-07 DIAGNOSIS — F419 Anxiety disorder, unspecified: Secondary | ICD-10-CM | POA: Diagnosis not present

## 2023-02-07 DIAGNOSIS — Z0000 Encounter for general adult medical examination without abnormal findings: Secondary | ICD-10-CM | POA: Diagnosis not present

## 2023-02-07 DIAGNOSIS — I10 Essential (primary) hypertension: Secondary | ICD-10-CM | POA: Diagnosis not present

## 2023-02-07 DIAGNOSIS — Q233 Congenital mitral insufficiency: Secondary | ICD-10-CM | POA: Diagnosis not present

## 2023-02-07 DIAGNOSIS — Z681 Body mass index (BMI) 19 or less, adult: Secondary | ICD-10-CM | POA: Diagnosis not present

## 2023-04-26 ENCOUNTER — Inpatient Hospital Stay: Payer: Medicare Other | Attending: Hematology

## 2023-04-26 ENCOUNTER — Inpatient Hospital Stay: Payer: Medicare Other

## 2023-04-26 VITALS — BP 170/81 | HR 67 | Temp 97.6°F | Resp 18

## 2023-04-26 DIAGNOSIS — Z85038 Personal history of other malignant neoplasm of large intestine: Secondary | ICD-10-CM | POA: Insufficient documentation

## 2023-04-26 DIAGNOSIS — M810 Age-related osteoporosis without current pathological fracture: Secondary | ICD-10-CM | POA: Diagnosis not present

## 2023-04-26 DIAGNOSIS — Z853 Personal history of malignant neoplasm of breast: Secondary | ICD-10-CM | POA: Insufficient documentation

## 2023-04-26 LAB — BASIC METABOLIC PANEL
Anion gap: 12 (ref 5–15)
BUN: 18 mg/dL (ref 8–23)
CO2: 26 mmol/L (ref 22–32)
Calcium: 9.5 mg/dL (ref 8.9–10.3)
Chloride: 100 mmol/L (ref 98–111)
Creatinine, Ser: 1.17 mg/dL — ABNORMAL HIGH (ref 0.44–1.00)
GFR, Estimated: 47 mL/min — ABNORMAL LOW (ref >60)
Glucose, Bld: 110 mg/dL — ABNORMAL HIGH (ref 70–99)
Potassium: 3.6 mmol/L (ref 3.5–5.1)
Sodium: 138 mmol/L (ref 135–145)

## 2023-04-26 MED ORDER — DENOSUMAB 60 MG/ML ~~LOC~~ SOSY
60.0000 mg | PREFILLED_SYRINGE | Freq: Once | SUBCUTANEOUS | Status: AC
  Administered 2023-04-26: 60 mg via SUBCUTANEOUS
  Filled 2023-04-26: qty 1

## 2023-04-26 NOTE — Patient Instructions (Signed)
Denosumab Injection (Osteoporosis) What is this medication? DENOSUMAB (den oh SUE mab) prevents and treats osteoporosis. It works by Interior and spatial designer stronger and less likely to break (fracture). It is a monoclonal antibody. This medicine may be used for other purposes; ask your health care provider or pharmacist if you have questions. COMMON BRAND NAME(S): Prolia What should I tell my care team before I take this medication? They need to know if you have any of these conditions: Dental or gum disease Had thyroid or parathyroid (glands located in neck) surgery Having dental surgery or a tooth pulled Kidney disease Low levels of calcium in the blood On dialysis Poor nutrition Thyroid disease Trouble absorbing nutrients from your food An unusual or allergic reaction to denosumab, other medications, foods, dyes, or preservatives Pregnant or trying to get pregnant Breastfeeding How should I use this medication? This medication is injected under the skin. It is given by your care team in a hospital or clinic setting. A special MedGuide will be given to you before each treatment. Be sure to read this information carefully each time. Talk to your care team about the use of this medication in children. Special care may be needed. Overdosage: If you think you have taken too much of this medicine contact a poison control center or emergency room at once. NOTE: This medicine is only for you. Do not share this medicine with others. What if I miss a dose? Keep appointments for follow-up doses. It is important not to miss your dose. Call your care team if you are unable to keep an appointment. What may interact with this medication? Do not take this medication with any of the following: Other medications that contain denosumab This medication may also interact with the following: Medications that lower your chance of fighting infection Steroid medications, such as prednisone or cortisone This  list may not describe all possible interactions. Give your health care provider a list of all the medicines, herbs, non-prescription drugs, or dietary supplements you use. Also tell them if you smoke, drink alcohol, or use illegal drugs. Some items may interact with your medicine. What should I watch for while using this medication? Your condition will be monitored carefully while you are receiving this medication. You may need blood work done while taking this medication. This medication may increase your risk of getting an infection. Call your care team for advice if you get a fever, chills, sore throat, or other symptoms of a cold or flu. Do not treat yourself. Try to avoid being around people who are sick. Tell your dentist and dental surgeon that you are taking this medication. You should not have major dental surgery while on this medication. See your dentist to have a dental exam and fix any dental problems before starting this medication. Take good care of your teeth while on this medication. Make sure you see your dentist for regular follow-up appointments. This medication may cause low levels of calcium in your body. The risk of severe side effects is increased in people with kidney disease. Your care team may prescribe calcium and vitamin D to help prevent low calcium levels while you take this medication. It is important to take calcium and vitamin D as directed by your care team. Talk to your care team if you may be pregnant. Serious birth defects may occur if you take this medication during pregnancy and for 5 months after the last dose. You will need a negative pregnancy test before starting this medication. Contraception  is recommended while taking this medication and for 5 months after the last dose. Your care team can help you find the option that works for you. Talk to your care team before breastfeeding. Changes to your treatment plan may be needed. What side effects may I notice from  receiving this medication? Side effects that you should report to your care team as soon as possible: Allergic reactions--skin rash, itching, hives, swelling of the face, lips, tongue, or throat Infection--fever, chills, cough, sore throat, wounds that don't heal, pain or trouble when passing urine, general feeling of discomfort or being unwell Low calcium level--muscle pain or cramps, confusion, tingling, or numbness in the hands or feet Osteonecrosis of the jaw--pain, swelling, or redness in the mouth, numbness of the jaw, poor healing after dental work, unusual discharge from the mouth, visible bones in the mouth Severe bone, joint, or muscle pain Skin infection--skin redness, swelling, warmth, or pain Side effects that usually do not require medical attention (report these to your care team if they continue or are bothersome): Back pain Headache Joint pain Muscle pain Pain in the hands, arms, legs, or feet Runny or stuffy nose Sore throat This list may not describe all possible side effects. Call your doctor for medical advice about side effects. You may report side effects to FDA at 1-800-FDA-1088. Where should I keep my medication? This medication is given in a hospital or clinic. It will not be stored at home. NOTE: This sheet is a summary. It may not cover all possible information. If you have questions about this medicine, talk to your doctor, pharmacist, or health care provider.  2024 Elsevier/Gold Standard (2022-04-24 00:00:00)

## 2023-04-26 NOTE — Progress Notes (Signed)
  Patient arrived for Prolia injection. Current BP is 173/69, pt asymptomatic. NP made aware, no new orders given. RN educated pt on the importance of when to seek emergency care, pt verbalized understanding and states she will recheck her BP when she gets home after running her errands. RN educated pt on the importance of notifying her PCP if her blood pressure continues to stay elevated. Pt verbalized understanding.     Patient tolerated Prolia  injection with no complaints voiced.  Site clean and dry with no bruising or swelling noted at site.  See MAR for details.  Band aid applied.  Patient stable during and after injection.  Vss with discharge and left in satisfactory condition with no s/s of distress noted. All follow ups as scheduled.   Keval Nam [employer]

## 2023-10-09 DIAGNOSIS — E782 Mixed hyperlipidemia: Secondary | ICD-10-CM | POA: Diagnosis not present

## 2023-10-09 DIAGNOSIS — Z7689 Persons encountering health services in other specified circumstances: Secondary | ICD-10-CM | POA: Diagnosis not present

## 2023-10-09 DIAGNOSIS — F419 Anxiety disorder, unspecified: Secondary | ICD-10-CM | POA: Diagnosis not present

## 2023-10-09 DIAGNOSIS — I10 Essential (primary) hypertension: Secondary | ICD-10-CM | POA: Diagnosis not present

## 2023-10-09 DIAGNOSIS — M810 Age-related osteoporosis without current pathological fracture: Secondary | ICD-10-CM | POA: Diagnosis not present

## 2023-10-25 ENCOUNTER — Inpatient Hospital Stay: Payer: BLUE CROSS/BLUE SHIELD | Attending: Hematology

## 2023-10-25 ENCOUNTER — Inpatient Hospital Stay: Payer: BLUE CROSS/BLUE SHIELD

## 2023-10-25 VITALS — BP 172/61 | HR 61 | Temp 96.3°F | Resp 18

## 2023-10-25 DIAGNOSIS — M810 Age-related osteoporosis without current pathological fracture: Secondary | ICD-10-CM | POA: Diagnosis not present

## 2023-10-25 DIAGNOSIS — E559 Vitamin D deficiency, unspecified: Secondary | ICD-10-CM | POA: Diagnosis not present

## 2023-10-25 LAB — BASIC METABOLIC PANEL WITH GFR
Anion gap: 12 (ref 5–15)
BUN: 24 mg/dL — ABNORMAL HIGH (ref 8–23)
CO2: 26 mmol/L (ref 22–32)
Calcium: 9.8 mg/dL (ref 8.9–10.3)
Chloride: 102 mmol/L (ref 98–111)
Creatinine, Ser: 1.51 mg/dL — ABNORMAL HIGH (ref 0.44–1.00)
GFR, Estimated: 35 mL/min — ABNORMAL LOW (ref >60)
Glucose, Bld: 123 mg/dL — ABNORMAL HIGH (ref 70–99)
Potassium: 4 mmol/L (ref 3.5–5.1)
Sodium: 140 mmol/L (ref 135–145)

## 2023-10-25 MED ORDER — DENOSUMAB 60 MG/ML ~~LOC~~ SOSY
60.0000 mg | PREFILLED_SYRINGE | Freq: Once | SUBCUTANEOUS | Status: AC
  Administered 2023-10-25: 60 mg via SUBCUTANEOUS
  Filled 2023-10-25: qty 1

## 2023-10-25 NOTE — Progress Notes (Signed)
 Calcium today is 9.8.  We will proceed with Prolia  injection per provider orders.  Patient tolerated injection with no complaints voiced.  Site clean and dry with no bruising or swelling noted.  No complaints of pain.  Discharged with vital signs stable and no signs or symptoms of distress noted.

## 2023-10-25 NOTE — Patient Instructions (Signed)
 [facility] [place] - A DEPT OF [facility]  Discharge Instructions: Thank you for choosing [facility] to provide your oncology and hematology care.  If you have a lab appointment with the Cancer Center - please note that after April 8th, 2024, all labs will be drawn in the cancer center.  You do not have to check in or register with the main entrance as you have in the past but will complete your check-in in the cancer center.  Wear comfortable clothing and clothing appropriate for easy access to any Portacath or PICC line.   We strive to give you quality time with your provider. You may need to reschedule your appointment if you arrive late (15 or more minutes).  Arriving late affects you and other patients whose appointments are after yours.  Also, if you miss three or more appointments without notifying the office, you may be dismissed from the clinic at the provider's discretion.      For prescription refill requests, have your pharmacy contact our office and allow 72 hours for refills to be completed.    Today you received the following:  Prolia.  Denosumab Injection (Osteoporosis) What is this medication? DENOSUMAB (den oh SUE mab) prevents and treats osteoporosis. It works by Interior and spatial designer stronger and less likely to break (fracture). It is a monoclonal antibody. This medicine may be used for other purposes; ask your health care provider or pharmacist if you have questions. COMMON BRAND NAME(S): Prolia What should I tell my care team before I take this medication? They need to know if you have any of these conditions: Dental or gum disease Had thyroid or parathyroid (glands located in neck) surgery Having dental surgery or a tooth pulled Kidney disease Low levels of calcium in the blood On dialysis Poor nutrition Thyroid disease Trouble absorbing nutrients from your food An unusual or allergic reaction to denosumab, other medications,  foods, dyes, or preservatives Pregnant or trying to get pregnant Breastfeeding How should I use this medication? This medication is injected under the skin. It is given by your care team in a hospital or clinic setting. A special MedGuide will be given to you before each treatment. Be sure to read this information carefully each time. Talk to your care team about the use of this medication in children. Special care may be needed. Overdosage: If you think you have taken too much of this medicine contact a poison control center or emergency room at once. NOTE: This medicine is only for you. Do not share this medicine with others. What if I miss a dose? Keep appointments for follow-up doses. It is important not to miss your dose. Call your care team if you are unable to keep an appointment. What may interact with this medication? Do not take this medication with any of the following: Other medications that contain denosumab This medication may also interact with the following: Medications that lower your chance of fighting infection Steroid medications, such as prednisone or cortisone This list may not describe all possible interactions. Give your health care provider a list of all the medicines, herbs, non-prescription drugs, or dietary supplements you use. Also tell them if you smoke, drink alcohol, or use illegal drugs. Some items may interact with your medicine. What should I watch for while using this medication? Your condition will be monitored carefully while you are receiving this medication. You may need blood work done while taking this medication. This medication  may increase your risk of getting an infection. Call your care team for advice if you get a fever, chills, sore throat, or other symptoms of a cold or flu. Do not treat yourself. Try to avoid being around people who are sick. Tell your dentist and dental surgeon that you are taking this medication. You should not have major  dental surgery while on this medication. See your dentist to have a dental exam and fix any dental problems before starting this medication. Take good care of your teeth while on this medication. Make sure you see your dentist for regular follow-up appointments. This medication may cause low levels of calcium in your body. The risk of severe side effects is increased in people with kidney disease. Your care team may prescribe calcium and vitamin D to help prevent low calcium levels while you take this medication. It is important to take calcium and vitamin D as directed by your care team. Talk to your care team if you may be pregnant. Serious birth defects may occur if you take this medication during pregnancy and for 5 months after the last dose. You will need a negative pregnancy test before starting this medication. Contraception is recommended while taking this medication and for 5 months after the last dose. Your care team can help you find the option that works for you. Talk to your care team before breastfeeding. Changes to your treatment plan may be needed. What side effects may I notice from receiving this medication? Side effects that you should report to your care team as soon as possible: Allergic reactions--skin rash, itching, hives, swelling of the face, lips, tongue, or throat Infection--fever, chills, cough, sore throat, wounds that don't heal, pain or trouble when passing urine, general feeling of discomfort or being unwell Low calcium level--muscle pain or cramps, confusion, tingling, or numbness in the hands or feet Osteonecrosis of the jaw--pain, swelling, or redness in the mouth, numbness of the jaw, poor healing after dental work, unusual discharge from the mouth, visible bones in the mouth Severe bone, joint, or muscle pain Skin infection--skin redness, swelling, warmth, or pain Side effects that usually do not require medical attention (report these to your care team if they  continue or are bothersome): Back pain Headache Joint pain Muscle pain Pain in the hands, arms, legs, or feet Runny or stuffy nose Sore throat This list may not describe all possible side effects. Call your doctor for medical advice about side effects. You may report side effects to FDA at 1-800-FDA-1088. Where should I keep my medication? This medication is given in a hospital or clinic. It will not be stored at home. NOTE: This sheet is a summary. It may not cover all possible information. If you have questions about this medicine, talk to your doctor, pharmacist, or health care provider.  2024 Elsevier/Gold Standard (2022-04-24 00:00:00)   To help prevent nausea and vomiting after your treatment, we encourage you to take your nausea medication as directed.  BELOW ARE SYMPTOMS THAT SHOULD BE REPORTED IMMEDIATELY: *FEVER GREATER THAN 100.4 F (38 C) OR HIGHER *CHILLS OR SWEATING *NAUSEA AND VOMITING THAT IS NOT CONTROLLED WITH YOUR NAUSEA MEDICATION *UNUSUAL SHORTNESS OF BREATH *UNUSUAL BRUISING OR BLEEDING *URINARY PROBLEMS (pain or burning when urinating, or frequent urination) *BOWEL PROBLEMS (unusual diarrhea, constipation, pain near the anus) TENDERNESS IN MOUTH AND THROAT WITH OR WITHOUT PRESENCE OF ULCERS (sore throat, sores in mouth, or a toothache) UNUSUAL RASH, SWELLING OR PAIN  UNUSUAL VAGINAL DISCHARGE OR ITCHING  Items with * indicate a potential emergency and should be followed up as soon as possible or go to the Emergency Department if any problems should occur.  Please show the CHEMOTHERAPY ALERT CARD or IMMUNOTHERAPY ALERT CARD at check-in to the Emergency Department and triage nurse.  Should you have questions after your visit or need to cancel or reschedule your appointment, please contact [facility] [place] - A DEPT OF Eligha Bridegroom [facility] [phone]  and follow the prompts.  Office hours are 8:00 a.m. to 4:30 p.m. Monday - Friday. Please  note that voicemails left after 4:00 p.m. may not be returned until the following business day.  We are closed weekends and major holidays. You have access to a nurse at all times for urgent questions. Please call the main number to the clinic [phone] and follow the prompts.  For any non-urgent questions, you may also contact your provider using MyChart. We now offer e-Visits for anyone 6 and older to request care online for non-urgent symptoms. For details visit [email or web address].   Also download the MyChart app! Go to the app store, search "MyChart", open the app, select [facility], and log in with your MyChart username and password.

## 2023-12-19 ENCOUNTER — Ambulatory Visit (HOSPITAL_COMMUNITY)
Admission: RE | Admit: 2023-12-19 | Discharge: 2023-12-19 | Disposition: A | Discharge status: Home / Self Care | Payer: BLUE CROSS/BLUE SHIELD | Source: Ambulatory Visit | Attending: Physician Assistant | Admitting: Physician Assistant

## 2023-12-19 ENCOUNTER — Encounter (HOSPITAL_COMMUNITY): Payer: Self-pay

## 2023-12-19 ENCOUNTER — Inpatient Hospital Stay: Payer: BLUE CROSS/BLUE SHIELD | Attending: Hematology

## 2023-12-19 DIAGNOSIS — C50911 Malignant neoplasm of unspecified site of right female breast: Secondary | ICD-10-CM | POA: Insufficient documentation

## 2023-12-19 DIAGNOSIS — Z85038 Personal history of other malignant neoplasm of large intestine: Secondary | ICD-10-CM | POA: Insufficient documentation

## 2023-12-19 DIAGNOSIS — Z1231 Encounter for screening mammogram for malignant neoplasm of breast: Secondary | ICD-10-CM | POA: Diagnosis not present

## 2023-12-19 DIAGNOSIS — E559 Vitamin D deficiency, unspecified: Secondary | ICD-10-CM | POA: Diagnosis not present

## 2023-12-19 DIAGNOSIS — M810 Age-related osteoporosis without current pathological fracture: Secondary | ICD-10-CM | POA: Diagnosis present

## 2023-12-19 DIAGNOSIS — Z853 Personal history of malignant neoplasm of breast: Secondary | ICD-10-CM | POA: Insufficient documentation

## 2023-12-19 LAB — CBC WITH DIFFERENTIAL/PLATELET
Abs Immature Granulocytes: 0.04 K/uL (ref 0.00–0.07)
Basophils Absolute: 0.1 K/uL (ref 0.0–0.1)
Basophils Relative: 1 %
Eosinophils Absolute: 0.2 K/uL (ref 0.0–0.5)
Eosinophils Relative: 3 %
HCT: 37.3 % (ref 36.0–46.0)
Hemoglobin: 12.5 g/dL (ref 12.0–15.0)
Immature Granulocytes: 1 %
Lymphocytes Relative: 18 %
Lymphs Abs: 1.3 K/uL (ref 0.7–4.0)
MCH: 31.3 pg (ref 26.0–34.0)
MCHC: 33.5 g/dL (ref 30.0–36.0)
MCV: 93.3 fL (ref 80.0–100.0)
Monocytes Absolute: 0.8 K/uL (ref 0.1–1.0)
Monocytes Relative: 11 %
Neutro Abs: 4.8 K/uL (ref 1.7–7.7)
Neutrophils Relative %: 66 %
Platelets: 159 K/uL (ref 150–400)
RBC: 4 MIL/uL (ref 3.87–5.11)
RDW: 13.6 % (ref 11.5–15.5)
WBC: 7.2 K/uL (ref 4.0–10.5)
nRBC: 0 % (ref 0.0–0.2)

## 2023-12-19 LAB — COMPREHENSIVE METABOLIC PANEL WITH GFR
ALT: 12 U/L (ref 0–44)
AST: 18 U/L (ref 15–41)
Albumin: 4.2 g/dL (ref 3.5–5.0)
Alkaline Phosphatase: 43 U/L (ref 38–126)
Anion gap: 11 (ref 5–15)
BUN: 23 mg/dL (ref 8–23)
CO2: 28 mmol/L (ref 22–32)
Calcium: 9.6 mg/dL (ref 8.9–10.3)
Chloride: 101 mmol/L (ref 98–111)
Creatinine, Ser: 1.25 mg/dL — ABNORMAL HIGH (ref 0.44–1.00)
GFR, Estimated: 43 mL/min — ABNORMAL LOW (ref >60)
Glucose, Bld: 98 mg/dL (ref 70–99)
Potassium: 3.9 mmol/L (ref 3.5–5.1)
Sodium: 140 mmol/L (ref 135–145)
Total Bilirubin: 0.8 mg/dL (ref 0.0–1.2)
Total Protein: 7 g/dL (ref 6.5–8.1)

## 2023-12-19 LAB — VITAMIN D 25 HYDROXY (VIT D DEFICIENCY, FRACTURES): Vit D, 25-Hydroxy: 25.29 ng/mL — ABNORMAL LOW (ref 30–100)

## 2023-12-24 NOTE — Progress Notes (Unsigned)
 [facility] [address], [place] [address]   CLINIC:  Medical Oncology/Hematology  PCP:  Gladis Lauraine BRAVO, NP [address] [place] / [place] [address] [phone]   REASON FOR VISIT:  Follow-up for history of right breast cancer and colon cancer  BRIEF ONCOLOGIC HISTORY:   Oncology History  Invasive ductal carcinoma of right breast (HCC)  07/19/2004 Imaging   PET- increased uptake in posterior aspect of right breast   08/09/2004 Imaging   Mammogram and US    08/09/2004 Initial Diagnosis   Invasive ductal carcinoma of right breast on needle core biopsy of right breast   08/22/2004 Imaging   MRI B/L Breast- 1.8 x 1.4 x 1.3 cm right breast mass   08/29/2004 - 12/13/2004 Chemotherapy   Arimidex x 4 months neoadjuvantly- 77% shrinkage by volume size   12/20/2004 Surgery   Right partial mastectomy- 2 cm, ER 89%, PR 10%, Her2 negative, Ki-67 16%.     02/06/2005 - 03/28/2005 Radiation Therapy   By Dr. Alena   05/15/2005 - 05/06/2007 Chemotherapy   Femara   08/29/2005 Imaging   Mammogram s/p chemo and resection.  BIRADS 2   05/06/2007 - 09/06/2009 Chemotherapy   Tamoxifen   Adenocarcinoma of sigmoid colon (HCC)  05/29/2004 Initial Diagnosis   Adenocarcinoma of sigmoid colon   05/29/2004 Surgery   Segmental resection, 6 cm maximum size, adenocarcinoma, 6/15 lymph nodes positive for disease, T3N2   07/25/2004 - 11/15/2004 Chemotherapy   Xeloda + Oxaliplatin, Avastin x 6 cycles.  Avastin d/c after cycle 1    Remission       CANCER STAGING:  Cancer Staging  Adenocarcinoma of sigmoid colon (HCC) Staging form: Colon and Rectum, AJCC 7th Edition - Clinical: No stage assigned - Unsigned - Pathologic: No stage assigned - Unsigned  Invasive ductal carcinoma of right breast (HCC) Staging form: Breast, AJCC 7th Edition - Clinical: Stage IA (T1c, N0, cM0) - Signed by Berry Debby RAMAN, PA-C on 01/11/2013   INTERVAL HISTORY:   [name], a 83 y.o.  female, returns for routine follow-up of her history of right breast cancer and history of colon cancer. Sumiko was last seen on 12/24/2022 by Pleasant Barefoot PA-C.   At today's visit, she  reports feeling fairly well apart from her age. She remains active doing yard work and exercise. She denies any recent hospitalizations, surgeries, or changes in her  baseline health status.  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  She denies any changes in bowel habits, rectal bleeding, or abdominal pain. She has not noticed any new breast lumps or axillary lymphadenopathy.    She denies any new onset chest pain or dyspnea. She has no new headaches, seizures, or focal neurologic deficits. She has occasional vertigo. No B symptoms such as fever, chills, night sweats, unintentional weight loss. She is tolerating Prolia  injections well and denies any recent fractures, bone pain, or jaw pain.  She reports 75% energy and 100% appetite. She is maintaining stable weight at this time.  ASSESSMENT & PLAN:  1.  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  She was treated with segmental resection, followed by adjuvant chemo with Xeloda\oxaliplatin x6 cycles; received Avastin with cycle 1, then Avastin discontinued. - She has been in remission since that time. - Last colonoscopy on 08/31/2013 with Dr. Shaaron.  He recommended repeat colonoscopy in 5 years (09/2018), but  per GI note from 2021, additional surveillance colonoscopy was deferred but could be considered as needed in the future. - She does not report any bleeding per rectum or melena.  No change in bowel habits. - PLAN: No indication for further testing at this time.  2.  Stage Ia invasive ductal carcinoma of the RIGHT breast (2006) - She was diagnosed in 07/2004.  Treated with neoadjuvant antiestrogen therapy Arimidex x4 months.  Then underwent lumpectomy, followed  by adjuvant radiation therapy. - She started on Femara 05/2005 and continued through 05/2007. - She then switched to tamoxifen from 05/2007 through 08/2009. - Physical exam today (12/25/23): Right lumpectomy scar in the upper outer quadrant is within normal limits.  No palpable masses in bilateral breasts.  Breast tissue is dense. - Mammogram on 12/19/2023 was BI-RADS Category 1, negative. - Labs (12/17/2022) with normal CBC/D, CMP at baseline with normal LFTs. - No red flag symptoms per patient history today  - PLAN: Patient is eligible for discharge to PCP, but since she has just establish care with new primary care provider, we will plan to see her back at least once more. - RTC in 1 year for follow-up with repeat mammogram, labs, physical exam   3.  Osteoporosis & vitamin D  deficiency - She has been receiving Prolia  since 03/05/2011.  Most recently given 10/23/2022. - DEXA scan on 12/03/2018 with a T score of -4.6. - Most recent DEXA (12/26/2022): T-score -4.3, slightly improved - She takes multivitamin at home.   - Labs (12/19/2023) show normal calcium 9.6, but slightly low vitamin D  25.29 - PLAN: We will continue Prolia  Q6 months indefinitely, as there is no specified limit to the duration of treatment, and it can be continued for 10+ years in patients at high risk for fracture. -- Continue multivitamin, but also start Vitamin D  1,000 units daily (OTC) -- Continue annual dental visits -- Due for DEXA bone density scan in September 2026   PLAN SUMMARY: >> BMP + Prolia  every 6 months (last given 10/25/2023) >> Mammogram in September 2026 >> DEXA/bone density September 2026 >> Labs in September 2026 = CBC/D, CMP, vitamin D  >> OFFICE visit in 1 year (1 week after labs/mammogram)    REVIEW OF SYSTEMS:  Review of Systems  Constitutional:  Negative for appetite change, chills, diaphoresis, fatigue (Following COVID/flu last month), fever and unexpected weight change.  HENT:   Negative for  lump/mass and nosebleeds.   Eyes:  Negative for eye problems.  Respiratory:  Negative for cough, hemoptysis and shortness of breath.   Cardiovascular:  Negative for chest pain, leg swelling and palpitations.  Gastrointestinal:  Negative for abdominal pain, blood in stool, constipation, diarrhea, nausea and vomiting.  Genitourinary:  Positive for dysuria. Negative for hematuria.   Skin: Negative.   Neurological:  Positive for dizziness. Negative for headaches and light-headedness.  Hematological:  Does not bruise/bleed easily.  Psychiatric/Behavioral:  Positive for sleep disturbance.     PHYSICAL EXAM:  Performance status (ECOG): 1 - Symptomatic but completely ambulatory  Vitals:   12/25/23 1001 12/25/23 1004  BP: (!) 168/64 (!) 169/68  Pulse: 65   Resp: 18   Temp: 98.2 F (36.8 C)   SpO2: 100%     Wt Readings from Last 3 Encounters:  12/25/23 118 lb 6.2 oz (53.7 kg)  12/24/22 116 lb 9.6 oz (52.9 kg)  12/26/21 117 lb 1.6 oz (53.1 kg)   Physical Exam Constitutional:      Appearance: Normal appearance. She is normal  weight.  Cardiovascular:     Heart sounds: Normal heart sounds.  Pulmonary:     Breath sounds: Normal breath sounds.  Chest:     Comments: Right lumpectomy scar in the upper outer quadrant is within normal limits.  No palpable masses in bilateral breasts.  Breast tissue is dense. Neurological:     General: No focal deficit present.     Mental Status: Mental status is at baseline.  Psychiatric:        Behavior: Behavior normal. Behavior is cooperative.      PAST MEDICAL/SURGICAL HISTORY:  Past Medical History:  Diagnosis Date   Adenocarcinoma of sigmoid colon (HCC) 11/14/2010   Arthritis    Breast CA (HCC) 11/14/2010   Colon cancer (HCC) 11/14/2010   Depression    Falls    H/O: hysterectomy    History of right knee surgery    for bone cancer right knee   Hypercholesteremia    Hypertension    Invasive ductal carcinoma of right breast (HCC)  11/14/2010   Mitral valve prolapse    Osteoporosis    Osteoporosis, senile 03/05/2011   Peripheral neuropathy    Peripheral neuropathy 01/17/2012   Grade 1 and chemotherapy induced   Personal history of radiation therapy 2012   S/P cataract surgery    bil eyes   Past Surgical History:  Procedure Laterality Date   ABDOMINAL HYSTERECTOMY     BREAST BIOPSY Left 2020   SCLEROTIC FIBROADENOMA WITH CALCIFICATIONS   CATARACT EXTRACTION, BILATERAL     COLONOSCOPY  08/27/2008   Dr. Wyvonne papilla.  Surgical anastomosis at 8-10 cm from the anal verge.  Residual rectal and colonic mucosa appeared normal.   COLONOSCOPY N/A 08/31/2013   normal residual rectum/colon.    Epidural steroid injection with Kenalog      Midline   KNEE SURGERY     LOW ANTERIOR BOWEL RESECTION     PORT-A-CATH REMOVAL  02/04/2012   Procedure: REMOVAL PORT-A-CATH;  Surgeon: Thresa JAYSON Pulling, MD;  Location: [facility];  Service: General;  Laterality: N/A;  minor room   retinal hole repair     bilateral   TONSILLECTOMY     YAG LASER APPLICATION Left 09/23/2012   Procedure: YAG LASER APPLICATION;  Surgeon: Oneil T. Roz, MD;  Location: [facility];  Service: Ophthalmology;  Laterality: Left;    SOCIAL HISTORY:  Social History   Socioeconomic History   Marital status: Married    Spouse name: Not on file   Number of children: Not on file   Years of education: Not on file   Highest education level: Not on file  Occupational History   Not on file  Tobacco Use   Smoking status: Never   Smokeless tobacco: Never  Substance and Sexual Activity   Alcohol use: No   Drug use: No   Sexual activity: Never  Other Topics Concern   Not on file  Social History Narrative   Not on file   Social Drivers of Health   Financial Resource Strain: Not on file  Food Insecurity: Not on file  Transportation Needs: Not on file  Physical Activity: Not on file  Stress: Not on file  Social Connections: Not on file  Intimate Partner  Violence: Not on file    FAMILY HISTORY:  Family History  Problem Relation Age of Onset   Breast cancer Mother    Stroke Mother    Cancer Mother        breast  cancer   Heart  attack Father    Colon cancer Neg Hx     CURRENT MEDICATIONS:  Current Outpatient Medications  Medication Sig Dispense Refill   acetaminophen  (TYLENOL ) 325 MG tablet Take 650 mg by mouth as needed for mild pain.     gabapentin  (NEURONTIN ) 300 MG capsule Take 1 cap at breakfast. Take 2 cap at lunch. Take 2 cap at dinner. Take 1 cap at bedtime. (Patient taking differently: Take 1 cap at breakfast. Take 1 cap at lunch. Take 1 cap at dinner. Take 1 cap at bedtime.) 180 capsule 6   lisinopril-hydrochlorothiazide (ZESTORETIC) 20-12.5 MG tablet Take 1 tablet by mouth daily.     LORazepam (ATIVAN) 1 MG tablet Take 1 mg by mouth as needed. Takes 1 tablet daily and then may take more if needed during the day     denosumab  (PROLIA ) 60 MG/ML SOLN injection Inject 60 mg into the skin every 6 (six) months. Administer in upper arm, thigh, or abdomen (Patient not taking: Reported on 12/25/2023)     No current facility-administered medications for this visit.    ALLERGIES:  Allergies  Allergen Reactions   Penicillins     LABORATORY DATA:  I have reviewed the labs as listed.     Latest Ref Rng & Units 12/19/2023   10:06 AM 12/17/2022   10:22 AM 12/11/2021   11:51 AM  CBC  WBC 4.0 - 10.5 K/uL 7.2  8.5  6.3   Hemoglobin 12.0 - 15.0 g/dL 87.4  87.2  86.9   Hematocrit 36.0 - 46.0 % 37.3  38.5  39.0   Platelets 150 - 400 K/uL 159  192  179       Latest Ref Rng & Units 12/19/2023   10:06 AM 10/25/2023   10:31 AM 04/26/2023    9:47 AM  CMP  Glucose 70 - 99 mg/dL 98  876  889   BUN 8 - 23 mg/dL 23  24  18    Creatinine 0.44 - 1.00 mg/dL 8.74  8.48  8.82   Sodium 135 - 145 mmol/L 140  140  138   Potassium 3.5 - 5.1 mmol/L 3.9  4.0  3.6   Chloride 98 - 111 mmol/L 101  102  100   CO2 22 - 32 mmol/L 28  26  26    Calcium 8.9  - 10.3 mg/dL 9.6  9.8  9.5   Total Protein 6.5 - 8.1 g/dL 7.0     Total Bilirubin 0.0 - 1.2 mg/dL 0.8     Alkaline Phos 38 - 126 U/L 43     AST 15 - 41 U/L 18     ALT 0 - 44 U/L 12       DIAGNOSTIC IMAGING:  I have independently reviewed the scans and discussed with the patient. MM 3D SCREENING MAMMOGRAM BILATERAL BREAST Result Date: 12/23/2023 CLINICAL DATA:  Screening. EXAM: DIGITAL SCREENING BILATERAL MAMMOGRAM WITH TOMOSYNTHESIS AND CAD TECHNIQUE: Bilateral screening digital craniocaudal and mediolateral oblique mammograms were obtained. Bilateral screening digital breast tomosynthesis was performed. The images were evaluated with computer-aided detection. COMPARISON:  Previous exam(s). ACR Breast Density Category c: The breasts are heterogeneously dense, which may obscure small masses. FINDINGS: There are no findings suspicious for malignancy. IMPRESSION: No mammographic evidence of malignancy. A result letter of this screening mammogram will be mailed directly to the patient. RECOMMENDATION: Screening mammogram in one year. (Code:SM-B-01Y) BI-RADS CATEGORY  1: Negative. Electronically Signed   By: Reyes Phi M.D.   On: 12/23/2023 09:06  WRAP UP:  All questions were answered. The patient knows to call the clinic with any problems, questions or concerns.  Medical decision making: Moderate  Time spent on visit: I spent 20 minutes counseling the patient face to face. The total time spent in the appointment was 30 minutes and more than 50% was on counseling.  Pleasant CHRISTELLA Barefoot, PA-C  12/25/23 10:53 AM

## 2023-12-25 ENCOUNTER — Other Ambulatory Visit: Payer: Self-pay | Admitting: Physician Assistant

## 2023-12-25 ENCOUNTER — Inpatient Hospital Stay (HOSPITAL_BASED_OUTPATIENT_CLINIC_OR_DEPARTMENT_OTHER): Payer: BLUE CROSS/BLUE SHIELD | Admitting: Physician Assistant

## 2023-12-25 VITALS — BP 169/68 | HR 65 | Temp 98.2°F | Resp 18 | Wt 118.4 lb

## 2023-12-25 DIAGNOSIS — E559 Vitamin D deficiency, unspecified: Secondary | ICD-10-CM | POA: Diagnosis not present

## 2023-12-25 DIAGNOSIS — Z85038 Personal history of other malignant neoplasm of large intestine: Secondary | ICD-10-CM

## 2023-12-25 DIAGNOSIS — C50911 Malignant neoplasm of unspecified site of right female breast: Secondary | ICD-10-CM

## 2023-12-25 DIAGNOSIS — M810 Age-related osteoporosis without current pathological fracture: Secondary | ICD-10-CM

## 2023-12-25 DIAGNOSIS — Z1231 Encounter for screening mammogram for malignant neoplasm of breast: Secondary | ICD-10-CM | POA: Diagnosis not present

## 2023-12-25 DIAGNOSIS — Z853 Personal history of malignant neoplasm of breast: Secondary | ICD-10-CM | POA: Diagnosis not present

## 2023-12-25 NOTE — Patient Instructions (Signed)
 [facility] at [facility] **VISIT SUMMARY & IMPORTANT INSTRUCTIONS **   You were seen today by Pleasant Barefoot PA-C for your history of breast cancer and colon cancer.    HISTORY OF COLON CANCER You do not have any evidence of recurrent colon cancer at this time.  HISTORY OF BREAST CANCER You do not have any evidence of recurrent breast cancer at this time, based on your most recent labs, mammogram, and physical exam. You will be due for repeat mammogram and office visit in 1 year.  OSTEOPOROSIS Will continue Prolia  injections every 6 months. Continue your daily multivitamin, but also START TAKING an additional vitamin D  1000 units daily (available over-the-counter) Continue annual dental visits. Let us  know if you experience any jaw pain, fractures, or new onset bone pain prior to your next visit.   ** Thank you for trusting me with your healthcare!  I strive to provide all of my patients with quality care at each visit.  If you receive a survey for this visit, I would be so grateful to you for taking the time to provide feedback.  Thank you in advance!  ~ Khyren Hing                                        Dr. Mickiel Davonna Pleasant Barefoot, PA-C      Delon Hope, NP   - - - - - - - - - - - - - - - - - -   Thank you for choosing [facility] at [facility] to provide your oncology and hematology care.  To afford each patient quality time with our provider, please arrive at least 15 minutes before your scheduled appointment time.   If you have a lab appointment with the Cancer Center please come in thru the Main Entrance and check in at the main information desk.  You need to re-schedule your appointment should you arrive 10 or more minutes late.  We strive to give you quality time with our providers, and arriving late affects you and other patients whose appointments are after yours.  Also, if you no show three or more times for  appointments you may be dismissed from the clinic at the providers discretion.     Again, thank you for choosing [facility].  Our hope is that these requests will decrease the amount of time that you wait before being seen by our physicians.       _____________________________________________________________  Should you have questions after your visit to [facility], please contact our office at [phone] and follow the prompts.  Our office hours are 8:00 a.m. and 4:30 p.m. Monday - Friday.  Please note that voicemails left after 4:00 p.m. may not be returned until the following business day.  We are closed weekends and major holidays.  You do have access to a nurse 24-7, just call the main number to the clinic [phone] and do not press any options, hold on the line and a nurse will answer the phone.    For prescription refill requests, have your pharmacy contact our office and allow 72 hours.

## 2023-12-30 ENCOUNTER — Other Ambulatory Visit: Payer: Self-pay | Admitting: Physician Assistant

## 2023-12-30 DIAGNOSIS — C50911 Malignant neoplasm of unspecified site of right female breast: Secondary | ICD-10-CM

## 2023-12-30 DIAGNOSIS — Z1231 Encounter for screening mammogram for malignant neoplasm of breast: Secondary | ICD-10-CM

## 2023-12-30 DIAGNOSIS — M810 Age-related osteoporosis without current pathological fracture: Secondary | ICD-10-CM

## 2024-01-10 DIAGNOSIS — Z23 Encounter for immunization: Secondary | ICD-10-CM | POA: Diagnosis not present

## 2024-02-05 DIAGNOSIS — Z79899 Other long term (current) drug therapy: Secondary | ICD-10-CM | POA: Diagnosis not present

## 2024-02-05 DIAGNOSIS — F419 Anxiety disorder, unspecified: Secondary | ICD-10-CM | POA: Diagnosis not present

## 2024-02-05 DIAGNOSIS — M810 Age-related osteoporosis without current pathological fracture: Secondary | ICD-10-CM | POA: Diagnosis not present

## 2024-02-05 DIAGNOSIS — I10 Essential (primary) hypertension: Secondary | ICD-10-CM | POA: Diagnosis not present

## 2024-03-03 ENCOUNTER — Encounter (HOSPITAL_COMMUNITY): Payer: Self-pay | Admitting: Oncology

## 2024-03-03 DIAGNOSIS — I10 Essential (primary) hypertension: Secondary | ICD-10-CM | POA: Diagnosis not present

## 2024-03-03 DIAGNOSIS — F419 Anxiety disorder, unspecified: Secondary | ICD-10-CM | POA: Diagnosis not present

## 2024-03-03 DIAGNOSIS — E782 Mixed hyperlipidemia: Secondary | ICD-10-CM | POA: Diagnosis not present

## 2024-04-27 ENCOUNTER — Inpatient Hospital Stay

## 2024-05-04 ENCOUNTER — Inpatient Hospital Stay

## 2024-05-07 ENCOUNTER — Inpatient Hospital Stay

## 2024-05-07 VITALS — BP 166/69 | HR 60 | Temp 96.9°F | Resp 18

## 2024-05-07 DIAGNOSIS — M810 Age-related osteoporosis without current pathological fracture: Secondary | ICD-10-CM

## 2024-05-07 LAB — BASIC METABOLIC PANEL WITH GFR
Anion gap: 13 (ref 5–15)
BUN: 25 mg/dL — ABNORMAL HIGH (ref 8–23)
CO2: 26 mmol/L (ref 22–32)
Calcium: 9.9 mg/dL (ref 8.9–10.3)
Chloride: 101 mmol/L (ref 98–111)
Creatinine, Ser: 1.44 mg/dL — ABNORMAL HIGH (ref 0.44–1.00)
GFR, Estimated: 36 mL/min — ABNORMAL LOW
Glucose, Bld: 135 mg/dL — ABNORMAL HIGH (ref 70–99)
Potassium: 3.8 mmol/L (ref 3.5–5.1)
Sodium: 140 mmol/L (ref 135–145)

## 2024-05-07 MED ORDER — DENOSUMAB 60 MG/ML ~~LOC~~ SOSY
60.0000 mg | PREFILLED_SYRINGE | Freq: Once | SUBCUTANEOUS | Status: AC
  Administered 2024-05-07: 60 mg via SUBCUTANEOUS
  Filled 2024-05-07: qty 1

## 2024-05-07 NOTE — Patient Instructions (Signed)
 Denosumab Injection (Osteoporosis) What is this medication? DENOSUMAB (den oh SUE mab) prevents and treats osteoporosis. It works by Interior and spatial designer stronger and less likely to break (fracture). It is a monoclonal antibody. This medicine may be used for other purposes; ask your health care provider or pharmacist if you have questions. COMMON BRAND NAME(S): Prolia What should I tell my care team before I take this medication? They need to know if you have any of these conditions: Dental or gum disease Had thyroid or parathyroid (glands located in neck) surgery Having dental surgery or a tooth pulled Kidney disease Low levels of calcium in the blood On dialysis Poor nutrition Thyroid disease Trouble absorbing nutrients from your food An unusual or allergic reaction to denosumab, other medications, foods, dyes, or preservatives Pregnant or trying to get pregnant Breastfeeding How should I use this medication? This medication is injected under the skin. It is given by your care team in a hospital or clinic setting. A special MedGuide will be given to you before each treatment. Be sure to read this information carefully each time. Talk to your care team about the use of this medication in children. Special care may be needed. Overdosage: If you think you have taken too much of this medicine contact a poison control center or emergency room at once. NOTE: This medicine is only for you. Do not share this medicine with others. What if I miss a dose? Keep appointments for follow-up doses. It is important not to miss your dose. Call your care team if you are unable to keep an appointment. What may interact with this medication? Do not take this medication with any of the following: Other medications that contain denosumab This medication may also interact with the following: Medications that lower your chance of fighting infection Steroid medications, such as prednisone or cortisone This  list may not describe all possible interactions. Give your health care provider a list of all the medicines, herbs, non-prescription drugs, or dietary supplements you use. Also tell them if you smoke, drink alcohol, or use illegal drugs. Some items may interact with your medicine. What should I watch for while using this medication? Your condition will be monitored carefully while you are receiving this medication. You may need blood work done while taking this medication. This medication may increase your risk of getting an infection. Call your care team for advice if you get a fever, chills, sore throat, or other symptoms of a cold or flu. Do not treat yourself. Try to avoid being around people who are sick. Tell your dentist and dental surgeon that you are taking this medication. You should not have major dental surgery while on this medication. See your dentist to have a dental exam and fix any dental problems before starting this medication. Take good care of your teeth while on this medication. Make sure you see your dentist for regular follow-up appointments. This medication may cause low levels of calcium in your body. The risk of severe side effects is increased in people with kidney disease. Your care team may prescribe calcium and vitamin D to help prevent low calcium levels while you take this medication. It is important to take calcium and vitamin D as directed by your care team. Talk to your care team if you may be pregnant. Serious birth defects may occur if you take this medication during pregnancy and for 5 months after the last dose. You will need a negative pregnancy test before starting this medication. Contraception  is recommended while taking this medication and for 5 months after the last dose. Your care team can help you find the option that works for you. Talk to your care team before breastfeeding. Changes to your treatment plan may be needed. What side effects may I notice from  receiving this medication? Side effects that you should report to your care team as soon as possible: Allergic reactions--skin rash, itching, hives, swelling of the face, lips, tongue, or throat Infection--fever, chills, cough, sore throat, wounds that don't heal, pain or trouble when passing urine, general feeling of discomfort or being unwell Low calcium level--muscle pain or cramps, confusion, tingling, or numbness in the hands or feet Osteonecrosis of the jaw--pain, swelling, or redness in the mouth, numbness of the jaw, poor healing after dental work, unusual discharge from the mouth, visible bones in the mouth Severe bone, joint, or muscle pain Skin infection--skin redness, swelling, warmth, or pain Side effects that usually do not require medical attention (report these to your care team if they continue or are bothersome): Back pain Headache Joint pain Muscle pain Pain in the hands, arms, legs, or feet Runny or stuffy nose Sore throat This list may not describe all possible side effects. Call your doctor for medical advice about side effects. You may report side effects to FDA at 1-800-FDA-1088. Where should I keep my medication? This medication is given in a hospital or clinic. It will not be stored at home. NOTE: This sheet is a summary. It may not cover all possible information. If you have questions about this medicine, talk to your doctor, pharmacist, or health care provider.  2024 [employer]vier/Gold Standard (2022-04-24 00:00:00)

## 2024-05-07 NOTE — Progress Notes (Signed)
 Labs reviewed. Per pt she is taking calcium and vitamin D  as prescribed. Patient tolerated prolia  injection with no complaints voiced.  Site clean and dry with no bruising or swelling noted at site.  See MAR for details.  Band aid applied.  Patient stable during and after injection.  Vss with discharge and left in satisfactory condition with no s/s of distress noted. All follow ups as scheduled.   Patricia Kline

## 2024-10-26 ENCOUNTER — Ambulatory Visit

## 2024-10-26 ENCOUNTER — Other Ambulatory Visit

## 2024-11-02 ENCOUNTER — Inpatient Hospital Stay

## 2024-12-28 ENCOUNTER — Other Ambulatory Visit (HOSPITAL_COMMUNITY)

## 2024-12-28 ENCOUNTER — Ambulatory Visit (HOSPITAL_COMMUNITY)

## 2024-12-28 ENCOUNTER — Ambulatory Visit: Admitting: Physician Assistant

## 2024-12-28 ENCOUNTER — Other Ambulatory Visit

## 2025-01-04 ENCOUNTER — Ambulatory Visit: Admitting: Physician Assistant
